# Patient Record
Sex: Female | Born: 1960
Health system: Southern US, Community
[De-identification: ages and names within clinical notes are randomized; demographics above are authoritative.]

## PROBLEM LIST (undated history)

## (undated) DIAGNOSIS — E559 Vitamin D deficiency, unspecified: Secondary | ICD-10-CM

## (undated) DIAGNOSIS — E0500 Thyrotoxicosis with diffuse goiter without thyrotoxic crisis or storm: Secondary | ICD-10-CM

## (undated) DIAGNOSIS — D259 Leiomyoma of uterus, unspecified: Secondary | ICD-10-CM

## (undated) DIAGNOSIS — E756 Lipid storage disorder, unspecified: Secondary | ICD-10-CM

## (undated) DIAGNOSIS — Z8052 Family history of malignant neoplasm of bladder: Secondary | ICD-10-CM

## (undated) DIAGNOSIS — E785 Hyperlipidemia, unspecified: Secondary | ICD-10-CM

## (undated) DIAGNOSIS — E0590 Thyrotoxicosis, unspecified without thyrotoxic crisis or storm: Secondary | ICD-10-CM

## (undated) HISTORY — DX: Vitamin D deficiency, unspecified: E55.9

## (undated) HISTORY — DX: Thyrotoxicosis with diffuse goiter without thyrotoxic crisis or storm: E05.00

## (undated) HISTORY — DX: Family history of malignant neoplasm of bladder: Z80.52

## (undated) HISTORY — DX: Lipid storage disorder, unspecified: E75.6

## (undated) HISTORY — DX: Hyperlipidemia, unspecified: E78.5

## (undated) HISTORY — DX: Thyrotoxicosis, unspecified without thyrotoxic crisis or storm: E05.90

## (undated) HISTORY — DX: Leiomyoma of uterus, unspecified: D25.9

---
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
## 2011-04-19 HISTORY — PX: NECK SURGERY: SHX720

## 2012-11-16 LAB — HM PAP SMEAR: HM Pap smear: NORMAL

## 2012-11-16 LAB — HM MAMMOGRAPHY: HM Mammogram: NORMAL

## 2012-11-29 DIAGNOSIS — E559 Vitamin D deficiency, unspecified: Secondary | ICD-10-CM | POA: Insufficient documentation

## 2013-02-19 LAB — HM COLONOSCOPY

## 2013-09-17 LAB — LIPID PANEL
CHOLESTEROL, TOTAL: 188
HDL: 69
Hemoglobin-A1c: 5.7
LDL: 88
Triglycerides: 153
VIT D 25 HYDROXY: 61.7

## 2013-12-17 LAB — LIPID PANEL
ALT: 29
AST: 31 U/L
Cholesterol, Total: 184
HDL: 73
LDL: 89
TRIGLYCERIDES: 109

## 2014-05-12 LAB — LIPID PANEL
CHOLESTEROL, TOTAL: 222
HDL: 72
Hemoglobin-A1c: 5.6
Triglycerides: 235
Vit D, 25-Hydroxy: 36.9

## 2014-05-17 DIAGNOSIS — E0500 Thyrotoxicosis with diffuse goiter without thyrotoxic crisis or storm: Secondary | ICD-10-CM | POA: Insufficient documentation

## 2014-08-07 ENCOUNTER — Encounter: Admit: 2014-08-07 | Disposition: A | Discharge status: Home / Self Care | Payer: Self-pay

## 2014-08-25 DIAGNOSIS — C4491 Basal cell carcinoma of skin, unspecified: Secondary | ICD-10-CM

## 2014-08-25 HISTORY — DX: Basal cell carcinoma of skin, unspecified: C44.91

## 2014-11-05 ENCOUNTER — Telehealth: Payer: Self-pay | Admitting: Family Medicine

## 2014-11-05 DIAGNOSIS — E785 Hyperlipidemia, unspecified: Secondary | ICD-10-CM

## 2014-11-05 NOTE — Telephone Encounter (Signed)
Pt wants to come by and pick up lab order.   She has appt aug 2 but she wants to get her labs done 11/10/14.     Thanks , Con Memos

## 2014-11-06 DIAGNOSIS — E785 Hyperlipidemia, unspecified: Secondary | ICD-10-CM | POA: Insufficient documentation

## 2014-11-06 NOTE — Telephone Encounter (Signed)
Order is ready to pick up.  

## 2014-11-12 LAB — NMR LIPOPROF NOLP+GRAPH
HDL Particle Number: 47.4 umol/L (ref >=30.5)
LDL Particle Number: 1970 nmol/L — ABNORMAL HIGH (ref <1000)
LDL Size: 19.6 nm (ref >20.5)
LP-IR Score: 60 — ABNORMAL HIGH (ref <=45)
NMR PDF Image: 0
Small LDL Particle Number: 1536 nmol/L — ABNORMAL HIGH (ref <=527)

## 2014-11-18 ENCOUNTER — Encounter: Payer: Self-pay | Admitting: *Deleted

## 2014-11-18 ENCOUNTER — Ambulatory Visit (INDEPENDENT_AMBULATORY_CARE_PROVIDER_SITE_OTHER): Payer: 59 | Admitting: Family Medicine

## 2014-11-18 ENCOUNTER — Encounter: Payer: Self-pay | Admitting: Family Medicine

## 2014-11-18 VITALS — BP 116/70 | HR 61 | Temp 98.3°F | Resp 16 | Wt 178.0 lb

## 2014-11-18 DIAGNOSIS — E559 Vitamin D deficiency, unspecified: Secondary | ICD-10-CM

## 2014-11-18 DIAGNOSIS — E756 Lipid storage disorder, unspecified: Secondary | ICD-10-CM | POA: Insufficient documentation

## 2014-11-18 DIAGNOSIS — E0590 Thyrotoxicosis, unspecified without thyrotoxic crisis or storm: Secondary | ICD-10-CM | POA: Insufficient documentation

## 2014-11-18 DIAGNOSIS — E119 Type 2 diabetes mellitus without complications: Secondary | ICD-10-CM | POA: Insufficient documentation

## 2014-11-18 DIAGNOSIS — R7309 Other abnormal glucose: Secondary | ICD-10-CM

## 2014-11-18 DIAGNOSIS — R7303 Prediabetes: Secondary | ICD-10-CM | POA: Insufficient documentation

## 2014-11-18 DIAGNOSIS — E785 Hyperlipidemia, unspecified: Secondary | ICD-10-CM | POA: Diagnosis not present

## 2014-11-18 DIAGNOSIS — E039 Hypothyroidism, unspecified: Secondary | ICD-10-CM | POA: Insufficient documentation

## 2014-11-18 DIAGNOSIS — R0989 Other specified symptoms and signs involving the circulatory and respiratory systems: Secondary | ICD-10-CM | POA: Insufficient documentation

## 2014-11-18 DIAGNOSIS — Z8052 Family history of malignant neoplasm of bladder: Secondary | ICD-10-CM | POA: Insufficient documentation

## 2014-11-18 LAB — POCT GLYCOSYLATED HEMOGLOBIN (HGB A1C)
ESTIMATED AVERAGE GLUCOSE: 114
HEMOGLOBIN A1C: 5.6

## 2014-11-18 MED ORDER — PRAVASTATIN SODIUM 20 MG PO TABS: 20.0000 mg | ORAL_TABLET | Freq: Every day | ORAL | Status: DC

## 2014-11-18 NOTE — Progress Notes (Signed)
       Patient: Connie Bradford Female    DOB: 29-Jun-1960   54 y.o.   MRN: [id number] Visit Date: 11/18/2014  Today's Provider: Lelon Huh, MD   Chief Complaint  Patient presents with  . Follow-up   Subjective:    HPI  Dyslipidemia She states she has not been following low saturated diet well and had stop taking the 40mg  pravastatin because it was causing leg cramps. She had been tolerating 20mg  pravastatin well before then.   Pre-diabetes She has not been doing well with diet, but is exercising most days and generally feeling well. Her A1c at last visit had improved to 5.6%    Allergies  Allergen Reactions  . Penicillins Rash   Previous Medications   B COMPLEX VITAMINS PO    Take 1 tablet by mouth daily.    METHIMAZOLE (TAPAZOLE) 10 MG TABLET    Take 0.5 tablets by mouth daily.   MULTIPLE VITAMIN PO    Take 1 tablet by mouth daily.   OMEGA 3-6-9 FATTY ACIDS (OMEGA 3-6-9 COMPLEX) CAPS    Take 1 capsule by mouth daily.   PRAVASTATIN (PRAVACHOL) 40 MG TABLET    Take 1 tablet by mouth at bedtime.   VITAMIN D, ERGOCALCIFEROL, (DRISDOL) 50000 UNITS CAPS CAPSULE    Take by mouth.    Review of Systems  Respiratory: Negative for chest tightness and shortness of breath.   Cardiovascular: Negative for chest pain and palpitations.  Neurological: Negative for dizziness, light-headedness and headaches.    History  Substance Use Topics  . Smoking status: Former Research scientist (life sciences)  . Smokeless tobacco: Not on file  . Alcohol Use: No   Objective:   BP 116/70 mmHg  Pulse 61  Temp(Src) 98.3 F (36.8 C) (Oral)  Resp 16  Wt 178 lb (80.74 kg)  SpO2 99%  LMP 12/18/2013  Physical Exam General appearance: alert, well developed, well nourished, cooperative and in no distress Head: Normocephalic, without obvious abnormality, atraumatic Lungs: Respirations even and unlabored Extremities: No gross deformities Skin: Skin color, texture, turgor normal. No rashes seen  Psych: Appropriate mood and  affect. Neurologic: Mental status: Alert, oriented to person, place, and time, thought content appropriate.      Assessment & Plan:     1. Pre-diabetes Stable. Continue regular exercise and work on improving diet.  - POCT glycosylated hemoglobin (Hb A1C)  2. Dyslipidemia Lipoprofile worsened since stopping pravatatin. Considering family history will start back on low dose of pravastatin.   3. Vitamin D deficiency        Lelon Huh, MD  [facility]

## 2014-11-19 ENCOUNTER — Other Ambulatory Visit: Payer: Self-pay | Admitting: Family Medicine

## 2015-02-17 ENCOUNTER — Encounter: Payer: Self-pay | Admitting: Family Medicine

## 2015-04-29 ENCOUNTER — Other Ambulatory Visit: Payer: Self-pay | Admitting: *Deleted

## 2015-04-29 ENCOUNTER — Other Ambulatory Visit: Payer: Self-pay | Admitting: Family Medicine

## 2015-04-29 MED ORDER — PRAVASTATIN SODIUM 20 MG PO TABS: ORAL_TABLET | ORAL | Status: DC

## 2015-04-29 MED ORDER — METHIMAZOLE 10 MG PO TABS: 5.0000 mg | ORAL_TABLET | Freq: Every day | ORAL | Status: DC

## 2015-04-29 NOTE — Telephone Encounter (Signed)
Patient is requesting that methimazole 5 mg and pravastatin 20 mg be sent to [facility]. Patient is also requesting valacyclovir 500 mg, this is not in her med list or in allscript.

## 2015-05-20 ENCOUNTER — Telehealth: Payer: Self-pay | Admitting: Family Medicine

## 2015-05-20 DIAGNOSIS — E785 Hyperlipidemia, unspecified: Secondary | ICD-10-CM

## 2015-05-20 DIAGNOSIS — R7303 Prediabetes: Secondary | ICD-10-CM

## 2015-05-20 DIAGNOSIS — E559 Vitamin D deficiency, unspecified: Secondary | ICD-10-CM

## 2015-05-20 NOTE — Telephone Encounter (Signed)
Pt has an appt next week and wants to pick up lab sheeet so she get get her labs done prior to the visit.  Her call back is Connie Bradford

## 2015-05-20 NOTE — Telephone Encounter (Signed)
Please advise 

## 2015-05-22 ENCOUNTER — Ambulatory Visit: Payer: 59 | Admitting: Family Medicine

## 2015-05-25 NOTE — Telephone Encounter (Signed)
Pt called back to see if lab sheet was ready to be picked up. Please advise. Thanks TNP

## 2015-05-27 ENCOUNTER — Ambulatory Visit: Payer: 59 | Admitting: Family Medicine

## 2015-06-01 NOTE — Telephone Encounter (Signed)
Patient notified and f/u appt rescheduled.

## 2015-06-04 LAB — NMR, LIPOPROFILE
Cholesterol: 228 mg/dL — ABNORMAL HIGH (ref 100–199)
HDL Cholesterol by NMR: 61 mg/dL (ref >39)
HDL Particle Number: 52 umol/L (ref >=30.5)
LDL PARTICLE NUMBER: 1356 nmol/L — AB (ref <1000)
LDL SIZE: 20 nm (ref >20.5)
LDL-C: 125 mg/dL — ABNORMAL HIGH (ref 0–99)
LP-IR SCORE: 56 — AB (ref <=45)
Small LDL Particle Number: 954 nmol/L — ABNORMAL HIGH (ref <=527)
Triglycerides by NMR: 212 mg/dL — ABNORMAL HIGH (ref 0–149)

## 2015-06-04 LAB — HEMOGLOBIN A1C
ESTIMATED AVERAGE GLUCOSE: 117 mg/dL
HEMOGLOBIN A1C: 5.7 % — AB (ref 4.8–5.6)

## 2015-06-04 LAB — VITAMIN D 25 HYDROXY (VIT D DEFICIENCY, FRACTURES): VIT D 25 HYDROXY: 39.6 ng/mL (ref 30.0–100.0)

## 2015-06-08 ENCOUNTER — Ambulatory Visit (INDEPENDENT_AMBULATORY_CARE_PROVIDER_SITE_OTHER): Payer: BC Managed Care – PPO | Admitting: Family Medicine

## 2015-06-08 ENCOUNTER — Encounter: Payer: Self-pay | Admitting: Family Medicine

## 2015-06-08 VITALS — BP 120/74 | HR 71 | Temp 98.1°F | Resp 16 | Wt 185.0 lb

## 2015-06-08 DIAGNOSIS — E785 Hyperlipidemia, unspecified: Secondary | ICD-10-CM | POA: Diagnosis not present

## 2015-06-08 DIAGNOSIS — R7303 Prediabetes: Secondary | ICD-10-CM

## 2015-06-08 LAB — POCT GLYCOSYLATED HEMOGLOBIN (HGB A1C)
ESTIMATED AVERAGE GLUCOSE: 120
Hemoglobin A1C: 5.8

## 2015-06-08 NOTE — Progress Notes (Signed)
Patient: Connie Bradford Female    DOB: 11-Feb-1961   55 y.o.   MRN: [id number] Visit Date: 06/08/2015  Today's Provider: Lelon Huh, MD   Chief Complaint  Patient presents with  . Follow-up  . Hyperlipidemia   Subjective:    HPI    Lipid/Cholesterol, Follow-up:   Last seen for this 6 months ago.  At that time she had stopped pravastatin due to muscle aches that started when dose was previously increase to 40mg . She is now tolerating 20mg  well and being cautious with her diet Last Lipid Panel:    Component Value Date/Time   CHOL 228* 06/03/2015 0806   CHOL 222 05/12/2014   TRIG 212* 06/03/2015 0806   TRIG 235 05/12/2014   HDL 61 06/03/2015 0806    She reports good compliance with treatment. She is not having side effects. none  Wt Readings from Last 3 Encounters:  06/08/15 185 lb (83.915 kg)  11/18/14 178 lb (80.74 kg)  05/20/14 176 lb (79.833 kg)    ----------------------------------------------------------------------  Follow up pre-diabetes She has a strong family history of diabetes and is attempting to keep under control with low glycemic index diet. She is exercising fairly regularly.      HEMOGLOBIN A1C  Date Value Ref Range Status  11/18/2014 5.6  Final   06/03/2015 5.7* 4.8 - 5.6 % Final     Allergies  Allergen Reactions  . Penicillins Rash   Previous Medications   B COMPLEX VITAMINS PO    Take 1 tablet by mouth daily.    CHOLECALCIFEROL (VITAMIN D3) 5000 UNITS CAPS    Take 5,000 Units by mouth daily.   METHIMAZOLE (TAPAZOLE) 10 MG TABLET    Take 0.5 tablets (5 mg total) by mouth daily.   MULTIPLE VITAMIN PO    Take 1 tablet by mouth daily.   OMEGA 3-6-9 FATTY ACIDS (OMEGA 3-6-9 COMPLEX) CAPS    Take 1 capsule by mouth daily.   PRAVASTATIN (PRAVACHOL) 20 MG TABLET    One tablet every  evening   VITAMIN D, ERGOCALCIFEROL, (DRISDOL) 50000 UNITS CAPS CAPSULE    Take by mouth. Reported on 06/08/2015    Review of Systems    Constitutional: Positive for diaphoresis. Negative for fever, chills, appetite change and fatigue.  Respiratory: Negative for chest tightness and shortness of breath.   Cardiovascular: Negative for chest pain and palpitations.  Gastrointestinal: Negative for nausea, vomiting and abdominal pain.  Neurological: Negative for dizziness and weakness.    Social History  Substance Use Topics  . Smoking status: Former Research scientist (life sciences)  . Smokeless tobacco: Not on file  . Alcohol Use: No   Objective:   BP 120/74 mmHg  Pulse 71  Temp(Src) 98.1 F (36.7 C) (Oral)  Resp 16  Wt 185 lb (83.915 kg)  SpO2 98%  Physical Exam  General appearance: alert, well developed, well nourished, cooperative and in no distress Head: Normocephalic, without obvious abnormality, atraumatic Lungs: Respirations even and unlabored Extremities: No gross deformities Skin: Skin color, texture, turgor normal. No rashes seen  Psych: Appropriate mood and affect. Neurologic: Mental status: Alert, oriented to person, place, and time, thought content appropriate.     Assessment & Plan:     1. Pre-diabetes Stable. Continue current diet  - POCT glycosylated hemoglobin (Hb A1C)  2. Dyslipidemia Doing well on 20mg  pravastatin, did not tolerate higher dose. Continue monitoring lipids Q6 months.         Lelon Huh, MD  [facility]

## 2015-11-03 ENCOUNTER — Other Ambulatory Visit: Payer: Self-pay | Admitting: *Deleted

## 2015-11-03 MED ORDER — PRAVASTATIN SODIUM 20 MG PO TABS: ORAL_TABLET | ORAL | Status: DC

## 2015-11-03 NOTE — Telephone Encounter (Signed)
Requesting 90 day supply be resent to [place] [facility].

## 2015-11-25 ENCOUNTER — Telehealth: Payer: Self-pay | Admitting: Family Medicine

## 2015-11-25 DIAGNOSIS — R7303 Prediabetes: Secondary | ICD-10-CM

## 2015-11-25 DIAGNOSIS — E785 Hyperlipidemia, unspecified: Secondary | ICD-10-CM

## 2015-11-25 NOTE — Telephone Encounter (Signed)
Pt is requesting a lab slip to recheck labs.  [id number]

## 2015-11-25 NOTE — Telephone Encounter (Signed)
Please advise lab slip? 

## 2015-11-27 ENCOUNTER — Telehealth: Payer: Self-pay | Admitting: *Deleted

## 2015-11-27 NOTE — Telephone Encounter (Signed)
error 

## 2015-12-07 ENCOUNTER — Ambulatory Visit: Payer: BC Managed Care – PPO | Admitting: Family Medicine

## 2015-12-16 ENCOUNTER — Ambulatory Visit: Payer: BC Managed Care – PPO | Admitting: Family Medicine

## 2016-01-06 ENCOUNTER — Other Ambulatory Visit: Payer: Self-pay | Admitting: Family Medicine

## 2016-01-06 DIAGNOSIS — Z1231 Encounter for screening mammogram for malignant neoplasm of breast: Secondary | ICD-10-CM

## 2016-02-10 ENCOUNTER — Other Ambulatory Visit: Payer: Self-pay | Admitting: Family Medicine

## 2016-02-10 ENCOUNTER — Ambulatory Visit
Admission: RE | Admit: 2016-02-10 | Discharge: 2016-02-10 | Disposition: A | Discharge status: Home / Self Care | Payer: Managed Care, Other (non HMO) | Source: Ambulatory Visit | Attending: Family Medicine | Admitting: Family Medicine

## 2016-02-10 DIAGNOSIS — Z1231 Encounter for screening mammogram for malignant neoplasm of breast: Secondary | ICD-10-CM

## 2016-02-10 LAB — HM PAP SMEAR: HM PAP: NEGATIVE

## 2016-02-10 LAB — HM MAMMOGRAPHY: HM Mammogram: NORMAL (ref 0–4)

## 2016-02-15 ENCOUNTER — Ambulatory Visit
Admission: RE | Admit: 2016-02-15 | Discharge: 2016-02-15 | Disposition: A | Discharge status: Home / Self Care | Payer: Self-pay | Source: Ambulatory Visit | Attending: *Deleted | Admitting: *Deleted

## 2016-02-15 ENCOUNTER — Other Ambulatory Visit: Payer: Self-pay | Admitting: *Deleted

## 2016-02-15 ENCOUNTER — Ambulatory Visit: Payer: BC Managed Care – PPO | Admitting: Family Medicine

## 2016-02-15 DIAGNOSIS — Z9289 Personal history of other medical treatment: Secondary | ICD-10-CM

## 2016-11-03 ENCOUNTER — Ambulatory Visit (INDEPENDENT_AMBULATORY_CARE_PROVIDER_SITE_OTHER): Payer: Managed Care, Other (non HMO) | Admitting: Physician Assistant

## 2016-11-03 VITALS — BP 122/74 | HR 72 | Temp 98.4°F | Resp 14 | Wt 183.0 lb

## 2016-11-03 DIAGNOSIS — K648 Other hemorrhoids: Secondary | ICD-10-CM | POA: Diagnosis not present

## 2016-11-03 DIAGNOSIS — K625 Hemorrhage of anus and rectum: Secondary | ICD-10-CM

## 2016-11-03 NOTE — Progress Notes (Signed)
Patient: Connie Bradford Female    DOB: June 04, 1960   56 y.o.   MRN: [id number] Visit Date: 11/03/2016  Today's Provider: Trinna Post, PA-C   Chief Complaint  Patient presents with  . Rectal Bleeding   Subjective:    HPI  Patient is here to discuss rectal bleeding. Patient states she has had this issue for 8 months except not today. She at first would notice blood on the toilet paper after bowel movement and has progressed now blood is in the toilet water. Bright red. She is not experiencing any pain, burning or itching with bowel movements. Had some stomach cramping a couple of times before with this. No nausea or vomiting. Patient states her bowels have been a softer consistency, she is not having hard bowels. Does strain at points. Had normal colonoscopy with internal hemorrhoids in 2014 with Dr. Vira Agar.    Allergies  Allergen Reactions  . Penicillins Rash     Current Outpatient Prescriptions:  .  B COMPLEX VITAMINS PO, Take 1 tablet by mouth daily. , Disp: , Rfl:  .  Cholecalciferol (VITAMIN D3) 5000 units CAPS, Take 5,000 Units by mouth daily., Disp: , Rfl:  .  methimazole (TAPAZOLE) 10 MG tablet, Take 0.5 tablets (5 mg total) by mouth daily., Disp: 90 tablet, Rfl: 3 .  MULTIPLE VITAMIN PO, Take 1 tablet by mouth daily., Disp: , Rfl:  .  Omega 3-6-9 Fatty Acids (OMEGA 3-6-9 COMPLEX) CAPS, Take 1 capsule by mouth daily., Disp: , Rfl:   Review of Systems  Constitutional: Negative.   Respiratory: Negative.   Cardiovascular: Negative.   Gastrointestinal: Positive for blood in stool. Negative for constipation, diarrhea, nausea, rectal pain and vomiting.    Social History  Substance Use Topics  . Smoking status: Former Research scientist (life sciences)  . Smokeless tobacco: Not on file  . Alcohol use No   Objective:   BP 122/74   Pulse 72   Temp 98.4 F (36.9 C)   Resp 14   Wt 183 lb (83 kg)   BMI 29.99 kg/m  Vitals:   11/03/16 1605  BP: 122/74  Pulse: 72  Resp: 14  Temp: 98.4  F (36.9 C)  Weight: 183 lb (83 kg)     Physical Exam  Constitutional: She is oriented to person, place, and time. She appears well-developed and well-nourished.  Genitourinary: Rectal exam shows guaiac positive stool. Rectal exam shows no external hemorrhoid, no fissure, no mass, no tenderness and anal tone normal. Pelvic exam was performed with patient in the knee-chest position.  Neurological: She is alert and oriented to person, place, and time.  Skin: Skin is warm and dry.  Psychiatric: She has a normal mood and affect. Her behavior is normal.        Assessment & Plan:     1. Bright red rectal bleeding  Slightly positive OC light in office. No anal fissure or external hemorrhoid. 2014 colonoscopy with internal hemorrhoids with Dr. Vira Agar. Likely her internal hemorrhoids. Patient remains concerned. Will refer to GI for further eval.   - Ambulatory referral to Gastroenterology - IFOBT POC (occult bld, rslt in office); Future  2. Internal hemorrhoid  - Ambulatory referral to Gastroenterology  Return if symptoms worsen or fail to improve.  The entirety of the information documented in the History of Present Illness, Review of Systems and Physical Exam were personally obtained by me. Portions of this information were initially documented by Ashley Royalty, CMA and reviewed by  me for thoroughness and accuracy.        Trinna Post, PA-C  [facility]

## 2016-11-03 NOTE — Patient Instructions (Signed)
Rectal Bleeding °Rectal bleeding is when blood comes out of the opening of the butt (anus). People with this kind of bleeding may notice bright red blood in their underwear or in the toilet after they poop (have a bowel movement). They may also have dark red or black poop (stool). Rectal bleeding is often a sign that something is wrong. It needs to be checked by a doctor. °Follow these instructions at home: °Watch for any changes in your condition. Take these actions to help with bleeding and discomfort: °· Eat a diet that is high in fiber. This will keep your poop soft so it is easier for you to poop without pushing too hard. Ask your doctor to tell you what foods and drinks are high in fiber. °· Drink enough fluid to keep your pee (urine) clear or pale yellow. This also helps keep your poop soft. °· Try taking a warm bath. This may help with pain. °· Keep all follow-up visits as told by your doctor. This is important. ° °Get help right away if: °· You have new bleeding. °· You have more bleeding than before. °· You have black or dark red poop. °· You throw up (vomit) blood or something that looks like coffee grounds. °· You have pain or tenderness in your belly (abdomen). °· You have a fever. °· You feel weak. °· You feel sick to your stomach (nauseous). °· You pass out (faint). °· You have very bad pain in your butt. °· You cannot poop. °This information is not intended to replace advice given to you by your health care provider. Make sure you discuss any questions you have with your health care provider. °Document Released: 12/15/2010 Document Revised: 09/10/2015 Document Reviewed: 05/31/2015 °Elsevier Interactive Patient Education © 2018 Elsevier Inc. ° °

## 2016-12-26 ENCOUNTER — Encounter: Payer: Self-pay | Admitting: Gastroenterology

## 2016-12-26 ENCOUNTER — Encounter (INDEPENDENT_AMBULATORY_CARE_PROVIDER_SITE_OTHER): Payer: Self-pay

## 2016-12-26 ENCOUNTER — Ambulatory Visit (INDEPENDENT_AMBULATORY_CARE_PROVIDER_SITE_OTHER): Payer: Managed Care, Other (non HMO) | Admitting: Gastroenterology

## 2016-12-26 ENCOUNTER — Telehealth: Payer: Self-pay | Admitting: Gastroenterology

## 2016-12-26 VITALS — BP 131/71 | HR 79 | Temp 98.5°F | Ht 66.0 in | Wt 185.8 lb

## 2016-12-26 DIAGNOSIS — K625 Hemorrhage of anus and rectum: Secondary | ICD-10-CM

## 2016-12-26 NOTE — Telephone Encounter (Signed)
Patient left a voice message that she needs to reschedule. Maybe the first week of October

## 2016-12-26 NOTE — Addendum Note (Signed)
Addended by: Peggye Ley on: 12/26/2016 02:18 PM   Modules accepted: Orders, SmartSet

## 2016-12-26 NOTE — Progress Notes (Signed)
Connie Bellows MD, MRCP(U.K) [address]  [place]  [place], [place] [address]  Main: [phone]  Fax: [phone]   Gastroenterology Consultation  Referring Provider:     Paulene Floor Primary Care Physician:  Birdie Sons, MD Primary Gastroenterologist:  Dr. Jonathon Bradford  Reason for Consultation:     Rectal bleeding          HPI:   Connie Bradford is a 56 y.o. y/o female referred for consultation & management  by Dr. Caryn Section, Kirstie Peri, MD.    She has been referred for rectal bleeding . Last colonoscopy in 2014 with Dr Tiffany Kocher showed internal hemorrhoids and hyperplastic polyps.   Rectal bleeding :  Onset and where was blood seen  :first noticed 1 year , since then it got better then worse and then better. At this point is still having rectal bleeding . Everytime she has a bowel movement, its blood on top of the stool.  Frequency of bowel movements :Atleast once a day  Consistency : not hard  Change in shape of stool:fluctuates Pain associated with bowel movements:no  Blood thinner usage:no  NSAID's: no  Prior colonoscopy :yes  Family history of colon cancer or polyps:no  Weight loss:no      Past Medical History:  Diagnosis Date  . Dyslipidemia   . Family history of bladder cancer   . Hyperthyroidism   . Inborn lipid storage disorder   . Vitamin D deficiency     Past Surgical History:  Procedure Laterality Date  . APPENDECTOMY  1982   Cyst removed from ovaries   . TONSILLECTOMY  1969    Prior to Admission medications   Medication Sig Start Date End Date Taking? Authorizing Provider  B COMPLEX VITAMINS PO Take 1 tablet by mouth daily.     [provider]  Cholecalciferol (VITAMIN D3) 5000 units CAPS Take 5,000 Units by mouth daily.    [provider]  methimazole (TAPAZOLE) 10 MG tablet Take 0.5 tablets (5 mg total) by mouth daily. 04/29/15   Birdie Sons, MD  MULTIPLE VITAMIN PO Take 1 tablet by mouth daily.    [provider]  Omega 3-6-9 Fatty Acids (OMEGA 3-6-9 COMPLEX) CAPS Take 1 capsule by mouth daily.    [provider]  valACYclovir (VALTREX) 500 MG tablet  11/25/16   [provider]    Family History  Problem Relation Age of Onset  . Bladder Cancer Mother   . Diabetes Father   . Heart disease Father   . Bladder Cancer Brother   . Breast cancer Neg Hx      Social History  Substance Use Topics  . Smoking status: Former Research scientist (life sciences)  . Smokeless tobacco: Not on file  . Alcohol use No    Allergies as of 12/26/2016 - Review Complete 11/03/2016  Allergen Reaction Noted  . Penicillins Rash 11/18/2014    Review of Systems:    All systems reviewed and negative except where noted in HPI.   Physical Exam:  There were no vitals taken for this visit. No LMP recorded. Patient is not currently having periods (Reason: Perimenopausal). Psych:  Alert and cooperative. Normal mood and affect. General:   Alert,  Well-developed, well-nourished, pleasant and cooperative in NAD Head:  Normocephalic and atraumatic. Eyes:  Sclera clear, no icterus.   Conjunctiva pink. Ears:  Normal auditory acuity. Nose:  No deformity, discharge, or lesions. Mouth:  No deformity or lesions,oropharynx pink & moist. Neck:  Supple; no masses or thyromegaly. Lungs:  Respirations even and unlabored.  Clear throughout to auscultation.   No wheezes, crackles, or rhonchi. No acute distress. Heart:  Regular rate and rhythm; no murmurs, clicks, rubs, or gallops. Abdomen:  Normal bowel sounds.  No bruits.  Soft, non-tender and non-distended without masses, hepatosplenomegaly or hernias noted.  No guarding or rebound tenderness.    Msk:  Symmetrical without gross deformities. Good, equal movement & strength bilaterally. Skin:  Intact without significant lesions or rashes. No jaundice. Lymph Nodes:  No significant cervical adenopathy. Psych:  Alert and cooperative. Normal mood and affect.  Imaging Studies: No  results found.  Assessment and Plan:   [name] is a 56 y.o. y/o female has been referred for rectal bleeding   Plan   1. Colonoscopy   I have discussed alternative options, risks & benefits,  which include, but are not limited to, bleeding, infection, perforation,respiratory complication & drug reaction.  The patient agrees with this plan & written consent will be obtained.     Follow up PRN  Dr Connie Bellows MD,MRCP(U.K)

## 2016-12-27 NOTE — Telephone Encounter (Signed)
Rescheduled patient for 10/5

## 2017-01-19 ENCOUNTER — Encounter: Payer: Self-pay | Admitting: *Deleted

## 2017-01-20 ENCOUNTER — Ambulatory Visit: Payer: Managed Care, Other (non HMO) | Admitting: Certified Registered"

## 2017-01-20 ENCOUNTER — Encounter: Payer: Self-pay | Admitting: *Deleted

## 2017-01-20 ENCOUNTER — Encounter
Admission: RE | Disposition: A | Discharge status: Home / Self Care | Payer: Self-pay | Source: Ambulatory Visit | Attending: Gastroenterology

## 2017-01-20 ENCOUNTER — Ambulatory Visit
Admission: RE | Admit: 2017-01-20 | Discharge: 2017-01-20 | Disposition: A | Discharge status: Home / Self Care | Payer: Managed Care, Other (non HMO) | Source: Ambulatory Visit | Attending: Gastroenterology | Admitting: Gastroenterology

## 2017-01-20 DIAGNOSIS — Z87891 Personal history of nicotine dependence: Secondary | ICD-10-CM | POA: Insufficient documentation

## 2017-01-20 DIAGNOSIS — E755 Other lipid storage disorders: Secondary | ICD-10-CM | POA: Insufficient documentation

## 2017-01-20 DIAGNOSIS — E0590 Thyrotoxicosis, unspecified without thyrotoxic crisis or storm: Secondary | ICD-10-CM | POA: Insufficient documentation

## 2017-01-20 DIAGNOSIS — Z79899 Other long term (current) drug therapy: Secondary | ICD-10-CM | POA: Insufficient documentation

## 2017-01-20 DIAGNOSIS — Z880 Allergy status to penicillin: Secondary | ICD-10-CM | POA: Diagnosis not present

## 2017-01-20 DIAGNOSIS — K640 First degree hemorrhoids: Secondary | ICD-10-CM | POA: Diagnosis not present

## 2017-01-20 DIAGNOSIS — K625 Hemorrhage of anus and rectum: Secondary | ICD-10-CM | POA: Insufficient documentation

## 2017-01-20 DIAGNOSIS — K635 Polyp of colon: Secondary | ICD-10-CM | POA: Diagnosis not present

## 2017-01-20 DIAGNOSIS — K51419 Inflammatory polyps of colon with unspecified complications: Secondary | ICD-10-CM | POA: Diagnosis not present

## 2017-01-20 DIAGNOSIS — E559 Vitamin D deficiency, unspecified: Secondary | ICD-10-CM | POA: Insufficient documentation

## 2017-01-20 DIAGNOSIS — E785 Hyperlipidemia, unspecified: Secondary | ICD-10-CM | POA: Insufficient documentation

## 2017-01-20 HISTORY — PX: COLONOSCOPY WITH PROPOFOL: SHX5780

## 2017-01-20 SURGERY — COLONOSCOPY WITH PROPOFOL
Anesthesia: General

## 2017-01-20 MED ORDER — PROPOFOL 500 MG/50ML IV EMUL
INTRAVENOUS | Status: DC | PRN
  Administered 2017-01-20: 120 ug/kg/min via INTRAVENOUS

## 2017-01-20 MED ORDER — LIDOCAINE 2% (20 MG/ML) 5 ML SYRINGE
INTRAMUSCULAR | Status: DC | PRN
  Administered 2017-01-20: 25 mg via INTRAVENOUS

## 2017-01-20 MED ORDER — PROPOFOL 10 MG/ML IV BOLUS
INTRAVENOUS | Status: DC | PRN
  Administered 2017-01-20: 70 mg via INTRAVENOUS
  Administered 2017-01-20: 30 mg via INTRAVENOUS

## 2017-01-20 MED ORDER — PROPOFOL 500 MG/50ML IV EMUL
INTRAVENOUS | Status: AC
  Filled 2017-01-20: qty 100

## 2017-01-20 MED ORDER — SODIUM CHLORIDE 0.9 % IV SOLN
INTRAVENOUS | Status: DC
Start: 2017-01-20 — End: 2017-01-20
  Administered 2017-01-20: 09:00:00 via INTRAVENOUS

## 2017-01-20 NOTE — Anesthesia Postprocedure Evaluation (Signed)
Anesthesia Post Note  Patient: Connie Bradford  Procedure(s) Performed: COLONOSCOPY WITH PROPOFOL (N/A )  Patient location during evaluation: Endoscopy Anesthesia Type: General Level of consciousness: awake and alert Pain management: pain level controlled Vital Signs Assessment: post-procedure vital signs reviewed and stable Respiratory status: spontaneous breathing and respiratory function stable Cardiovascular status: stable Anesthetic complications: no     Last Vitals:  Vitals:   01/20/17 1037 01/20/17 1038  BP: 115/65 (!) 116/55  Pulse: 70 77  Resp: (!) 21 16  Temp: (!) 35.8 C (!) 36.1 C  SpO2: 100% 100%    Last Pain:  Vitals:   01/20/17 1037  TempSrc: Tympanic                 KEPHART,WILLIAM K

## 2017-01-20 NOTE — Anesthesia Preprocedure Evaluation (Signed)
Anesthesia Evaluation  Patient identified by MRN, date of birth, ID band Patient awake    Reviewed: Allergy & Precautions, NPO status , Patient's Chart, lab work & pertinent test results  History of Anesthesia Complications Negative for: history of anesthetic complications  Airway Mallampati: II       Dental   Pulmonary neg sleep apnea, neg COPD, former smoker,           Cardiovascular (-) hypertension(-) Past MI and (-) CHF (-) dysrhythmias (-) Valvular Problems/Murmurs     Neuro/Psych neg Seizures    GI/Hepatic Neg liver ROS, neg GERD  ,  Endo/Other  neg diabetesHyperthyroidism   Renal/GU negative Renal ROS     Musculoskeletal   Abdominal   Peds  Hematology   Anesthesia Other Findings   Reproductive/Obstetrics                             Anesthesia Physical Anesthesia Plan  ASA: II  Anesthesia Plan: General   Post-op Pain Management:    Induction: Intravenous  PONV Risk Score and Plan: Propofol infusion  Airway Management Planned: Nasal Cannula  Additional Equipment:   Intra-op Plan:   Post-operative Plan:   Informed Consent: I have reviewed the patients History and Physical, chart, labs and discussed the procedure including the risks, benefits and alternatives for the proposed anesthesia with the patient or authorized representative who has indicated his/her understanding and acceptance.     Plan Discussed with:   Anesthesia Plan Comments:         Anesthesia Quick Evaluation

## 2017-01-20 NOTE — Anesthesia Post-op Follow-up Note (Signed)
Anesthesia QCDR form completed.        

## 2017-01-20 NOTE — H&P (Signed)
  Jonathon Bellows MD [address], [place], [place] [address] Phone: [phone] Fax : [phone]  Primary Care Physician:  Birdie Sons, MD Primary Gastroenterologist:  Dr. Jonathon Bellows   Pre-Procedure History & Physical: HPI:  Connie Bradford is a 56 y.o. female is here for an colonoscopy.   Past Medical History:  Diagnosis Date  . Dyslipidemia   . Family history of bladder cancer   . Hyperthyroidism   . Inborn lipid storage disorder   . Vitamin D deficiency     Past Surgical History:  Procedure Laterality Date  . APPENDECTOMY  1982   Cyst removed from ovaries   . TONSILLECTOMY  1969    Prior to Admission medications   Medication Sig Start Date End Date Taking? Authorizing Provider  B COMPLEX VITAMINS PO Take 1 tablet by mouth daily.    Yes [provider]  Cholecalciferol (VITAMIN D3) 5000 units CAPS Take 5,000 Units by mouth daily.   Yes [provider]  MULTIPLE VITAMIN PO Take 1 tablet by mouth daily.   Yes [provider]  Omega 3-6-9 Fatty Acids (OMEGA 3-6-9 COMPLEX) CAPS Take 1 capsule by mouth daily.   Yes [provider]  methimazole (TAPAZOLE) 10 MG tablet Take 0.5 tablets (5 mg total) by mouth daily. 04/29/15   Birdie Sons, MD  valACYclovir (VALTREX) 500 MG tablet  11/25/16   [provider]    Allergies as of 12/26/2016 - Review Complete 12/26/2016  Allergen Reaction Noted  . Penicillins Rash 11/18/2014    Family History  Problem Relation Age of Onset  . Bladder Cancer Mother   . Diabetes Father   . Heart disease Father   . Bladder Cancer Brother   . Breast cancer Neg Hx     Social History   Social History  . Marital status: Married    Spouse name: N/A  . Number of children: 0  . Years of education: Coll Grad   Occupational History  . Full-Time     [place] [place] x9   Social History Main Topics  . Smoking status: Former Research scientist (life sciences)  . Smokeless tobacco: Never Used  . Alcohol  use No  . Drug use: No  . Sexual activity: Yes   Other Topics Concern  . Not on file   Social History Narrative  . No narrative on file    Review of Systems: See HPI, otherwise negative ROS  Physical Exam: BP 136/70   Pulse 75   Temp (!) 97.2 F (36.2 C) (Tympanic)   Resp 12   Ht 5\' 6"  (1.676 m)   Wt 178 lb (80.7 kg)   LMP 12/18/2013   SpO2 100%   BMI 28.73 kg/m  General:   Alert,  pleasant and cooperative in NAD Head:  Normocephalic and atraumatic. Neck:  Supple; no masses or thyromegaly. Lungs:  Clear throughout to auscultation.    Heart:  Regular rate and rhythm. Abdomen:  Soft, nontender and nondistended. Normal bowel sounds, without guarding, and without rebound.   Neurologic:  Alert and  oriented x4;  grossly normal neurologically.  Impression/Plan: Connie Bradford is here for an colonoscopy to be performed for rectal bleeding   Risks, benefits, limitations, and alternatives regarding  colonoscopy have been reviewed with the patient.  Questions have been answered.  All parties agreeable.   Jonathon Bellows, MD  01/20/2017, 9:34 AM

## 2017-01-20 NOTE — Op Note (Signed)
[facility] Gastroenterology Patient Name: Connie Bradford Procedure Date: 01/20/2017 10:10 AM MRN: [id number] Account #: [id number] Date of Birth: [birth year] Admit Type: Outpatient Age: 56 Room: [facility] ENDO ROOM 3 Gender: Female Note Status: Finalized Procedure:            Colonoscopy Indications:          Rectal bleeding Providers:            Jonathon Bellows MD, MD Referring MD:         Kirstie Peri. Caryn Section, MD (Referring MD) Medicines:            Monitored Anesthesia Care Complications:        No immediate complications. Procedure:            Pre-Anesthesia Assessment:                       - Prior to the procedure, a History and Physical was                        performed, and patient medications, allergies and                        sensitivities were reviewed. The patient's tolerance of                        previous anesthesia was reviewed.                       - The risks and benefits of the procedure and the                        sedation options and risks were discussed with the                        patient. All questions were answered and informed                        consent was obtained.                       - ASA Grade Assessment: II - A patient with mild                        systemic disease.                       After obtaining informed consent, the colonoscope was                        passed under direct vision. Throughout the procedure,                        the patient's blood pressure, pulse, and oxygen                        saturations were monitored continuously. The                        Colonoscope was introduced through the anus and                        advanced  to the the cecum, identified by the                        appendiceal orifice, IC valve and transillumination.                        The colonoscopy was performed with ease. The patient                        tolerated the procedure well. The quality of the bowel                preparation was good. Findings:      The perianal and digital rectal examinations were normal.      Non-bleeding internal hemorrhoids were found during retroflexion. The       hemorrhoids were large and Grade I (internal hemorrhoids that do not       prolapse).      A 3 mm polyp was found in the sigmoid colon. The polyp was sessile. The       polyp was removed with a cold biopsy forceps. Resection and retrieval       were complete.      The exam was otherwise without abnormality on direct and retroflexion       views. Impression:           - Non-bleeding internal hemorrhoids.                       - One 3 mm polyp in the sigmoid colon, removed with a                        cold biopsy forceps. Resected and retrieved.                       - The examination was otherwise normal on direct and                        retroflexion views. Recommendation:       - Discharge patient to home (with escort).                       - Resume previous diet.                       - Continue present medications.                       - Await pathology results.                       - Repeat colonoscopy for surveillance based on                        pathology results.                       - - Return to GI clinic with Dr Marius Ditch in 2 weeks to                        discuss treatment for bleeding hemorroids [Return Day]. Procedure Code(s):    --- Professional ---  45380, Colonoscopy, flexible; with biopsy, single or                        multiple Diagnosis Code(s):    --- Professional ---                       D12.5, Benign neoplasm of sigmoid colon                       K64.0, First degree hemorrhoids                       K62.5, Hemorrhage of anus and rectum CPT copyright 2016 American Medical Association. All rights reserved. The codes documented in this report are preliminary and upon coder review may  be revised to meet current compliance requirements. Jonathon Bellows, MD Jonathon Bellows MD, MD 01/20/2017 10:35:26 AM This report has been signed electronically. Number of Addenda: 0 Note Initiated On: 01/20/2017 10:10 AM Scope Withdrawal Time: 0 hours 11 minutes 12 seconds  Total Procedure Duration: 0 hours 17 minutes 34 seconds       [facility]

## 2017-01-20 NOTE — Transfer of Care (Signed)
Immediate Anesthesia Transfer of Care Note  Patient: Connie Bradford  Procedure(s) Performed: COLONOSCOPY WITH PROPOFOL (N/A )  Patient Location: Endoscopy Unit  Anesthesia Type:General  Level of Consciousness: awake and alert   Airway & Oxygen Therapy: Patient Spontanous Breathing  Post-op Assessment: Report given to RN and Post -op Vital signs reviewed and stable  Post vital signs: Reviewed  Last Vitals:  Vitals:   01/20/17 1037 01/20/17 1038  BP: 115/65 (!) 116/55  Pulse: 70 77  Resp: (!) 21 16  Temp: (!) 35.8 C (!) 36.1 C  SpO2: 100% 100%    Last Pain:  Vitals:   01/20/17 1037  TempSrc: Tympanic         Complications: No apparent anesthesia complications

## 2017-01-23 ENCOUNTER — Encounter: Payer: Self-pay | Admitting: Gastroenterology

## 2017-01-23 LAB — SURGICAL PATHOLOGY

## 2017-01-27 ENCOUNTER — Telehealth: Payer: Self-pay | Admitting: Gastroenterology

## 2017-02-01 ENCOUNTER — Telehealth: Payer: Self-pay | Admitting: Gastroenterology

## 2017-02-01 NOTE — Telephone Encounter (Signed)
Left voice message for patient to call and schedule for the hemorrhoid clinic in January with Dr. Marius Ditch

## 2017-02-01 NOTE — Telephone Encounter (Signed)
ERROR

## 2017-02-06 ENCOUNTER — Telehealth: Payer: Self-pay

## 2017-02-06 NOTE — Telephone Encounter (Signed)
-----   Message from Jonathon Bellows, MD sent at 02/02/2017 11:01 AM EDT ----- Polyp was benign- inform repeat colonoscopy in 5 years if has self or family history of colon cancer or polyps- otherwise 10 years

## 2017-02-06 NOTE — Telephone Encounter (Signed)
Advised patient of results per Dr. Vicente Males.   Polyp was benign- inform repeat colonoscopy in 5 years if has self or family history of colon cancer or polyps- otherwise 10 years

## 2017-02-09 ENCOUNTER — Telehealth: Payer: Self-pay | Admitting: Gastroenterology

## 2017-02-09 NOTE — Telephone Encounter (Signed)
lvm and told patient that I had to reschedule banding appt due to Dr. Marius Ditch being out.

## 2017-02-27 ENCOUNTER — Ambulatory Visit: Payer: Managed Care, Other (non HMO) | Admitting: Gastroenterology

## 2017-04-20 ENCOUNTER — Ambulatory Visit (INDEPENDENT_AMBULATORY_CARE_PROVIDER_SITE_OTHER): Payer: Managed Care, Other (non HMO) | Admitting: Gastroenterology

## 2017-04-20 ENCOUNTER — Encounter (INDEPENDENT_AMBULATORY_CARE_PROVIDER_SITE_OTHER): Payer: Self-pay

## 2017-04-20 ENCOUNTER — Encounter: Payer: Self-pay | Admitting: Gastroenterology

## 2017-04-20 VITALS — BP 145/72 | HR 76 | Temp 98.0°F | Ht 67.0 in | Wt 187.0 lb

## 2017-04-20 DIAGNOSIS — K625 Hemorrhage of anus and rectum: Secondary | ICD-10-CM | POA: Diagnosis not present

## 2017-04-20 DIAGNOSIS — K641 Second degree hemorrhoids: Secondary | ICD-10-CM | POA: Diagnosis not present

## 2017-04-20 DIAGNOSIS — K649 Unspecified hemorrhoids: Secondary | ICD-10-CM

## 2017-04-20 NOTE — Progress Notes (Signed)
PROCEDURE NOTE: The patient presents with symptomatic grade 2 hemorrhoids, unresponsive to maximal medical therapy, requesting rubber band ligation of his/her hemorrhoidal disease.  All risks, benefits and alternative forms of therapy were described and informed consent was obtained.  In the Left Lateral Decubitus position anoscopic examination revealed grade 2 hemorrhoids in the all position(s).   The decision was made to band the RP internal hemorrhoid, and the [place] was used to perform band ligation without complication.  Digital anorectal examination was then performed to assure proper positioning of the band, and to adjust the banded tissue as required.  The patient was discharged home without pain or other issues.  Dietary and behavioral recommendations were given and (if necessary - prescriptions were given), along with follow-up instructions.  The patient will return 2 weeks for follow-up and possible additional banding as required.  No complications were encountered and the patient tolerated the procedure well.  Cephas Darby, MD [address]  [place]  [place], [place] [address]  Main: [phone]  Fax: [phone] Pager: [phone]

## 2017-04-24 ENCOUNTER — Ambulatory Visit: Payer: Managed Care, Other (non HMO) | Admitting: Gastroenterology

## 2017-05-01 ENCOUNTER — Ambulatory Visit: Payer: Managed Care, Other (non HMO) | Admitting: Gastroenterology

## 2017-05-05 ENCOUNTER — Encounter: Payer: Self-pay | Admitting: Gastroenterology

## 2017-05-05 ENCOUNTER — Ambulatory Visit (INDEPENDENT_AMBULATORY_CARE_PROVIDER_SITE_OTHER): Payer: Managed Care, Other (non HMO) | Admitting: Gastroenterology

## 2017-05-05 ENCOUNTER — Other Ambulatory Visit: Payer: Self-pay

## 2017-05-05 VITALS — BP 130/74 | HR 68 | Temp 98.1°F | Ht 67.0 in | Wt 187.8 lb

## 2017-05-05 DIAGNOSIS — K641 Second degree hemorrhoids: Secondary | ICD-10-CM | POA: Diagnosis not present

## 2017-05-05 MED ORDER — NONFORMULARY OR COMPOUNDED ITEM: 0 refills | Status: DC

## 2017-05-05 NOTE — Progress Notes (Signed)
PROCEDURE NOTE: The patient presents with symptomatic grade 2 hemorrhoids, unresponsive to maximal medical therapy, requesting rubber band ligation of his/her hemorrhoidal disease.  All risks, benefits and alternative forms of therapy were described and informed consent was obtained.  The decision was made to band the LL internal hemorrhoid, and the [facility] [place] System was used to perform band ligation without complication.  Digital anorectal examination was then performed to assure proper positioning of the band, and to adjust the banded tissue as required.  The patient was discharged home without pain or other issues.  Dietary and behavioral recommendations were given and (if necessary - prescriptions were given), along with follow-up instructions.  The patient will return 2 weeks for follow-up and possible additional banding as required.  No complications were encountered and the patient tolerated the procedure well.  Rohini R Vanga, MD [address]  [address]  [place], [place] [address]  Main: [phone]  Fax: [phone] Pager: [phone]   

## 2017-05-19 ENCOUNTER — Ambulatory Visit (INDEPENDENT_AMBULATORY_CARE_PROVIDER_SITE_OTHER): Payer: Managed Care, Other (non HMO) | Admitting: Gastroenterology

## 2017-05-19 ENCOUNTER — Encounter: Payer: Self-pay | Admitting: Gastroenterology

## 2017-05-19 VITALS — BP 138/80 | HR 72 | Temp 98.1°F | Ht 67.0 in | Wt 188.0 lb

## 2017-05-19 DIAGNOSIS — K625 Hemorrhage of anus and rectum: Secondary | ICD-10-CM | POA: Diagnosis not present

## 2017-05-19 DIAGNOSIS — K641 Second degree hemorrhoids: Secondary | ICD-10-CM

## 2017-05-19 NOTE — Progress Notes (Signed)
PROCEDURE NOTE: The patient presents with symptomatic grade 2 hemorrhoids, unresponsive to maximal medical therapy, requesting rubber band ligation of his/her hemorrhoidal disease.  All risks, benefits and alternative forms of therapy were described and informed consent was obtained.  The decision was made to band the RA internal hemorrhoid, and the [place] was used to perform band ligation without complication.  Digital anorectal examination was then performed to assure proper positioning of the band, and to adjust the banded tissue as required.  The patient was discharged home without pain or other issues.  Dietary and behavioral recommendations were given and (if necessary - prescriptions were given), along with follow-up instructions.  The patient will return as needed for follow-up and possible additional banding as required.  I had performed ligation of all 3 hemorrhoids. She will return to hemorrhoid clinic if needed   No complications were encountered and the patient tolerated the procedure well.  Cephas Darby, MD [address]  [place]  [place], [place] [address]  Main: [phone]  Fax: [phone] Pager: [phone]

## 2017-05-26 ENCOUNTER — Ambulatory Visit (INDEPENDENT_AMBULATORY_CARE_PROVIDER_SITE_OTHER): Payer: Managed Care, Other (non HMO) | Admitting: Obstetrics & Gynecology

## 2017-05-26 ENCOUNTER — Encounter: Payer: Self-pay | Admitting: Obstetrics & Gynecology

## 2017-05-26 VITALS — BP 140/80 | HR 62 | Ht 67.0 in | Wt 188.0 lb

## 2017-05-26 DIAGNOSIS — Z131 Encounter for screening for diabetes mellitus: Secondary | ICD-10-CM | POA: Diagnosis not present

## 2017-05-26 DIAGNOSIS — K51419 Inflammatory polyps of colon with unspecified complications: Secondary | ICD-10-CM | POA: Diagnosis not present

## 2017-05-26 DIAGNOSIS — Z1239 Encounter for other screening for malignant neoplasm of breast: Secondary | ICD-10-CM

## 2017-05-26 DIAGNOSIS — D259 Leiomyoma of uterus, unspecified: Secondary | ICD-10-CM | POA: Diagnosis not present

## 2017-05-26 DIAGNOSIS — Z1231 Encounter for screening mammogram for malignant neoplasm of breast: Secondary | ICD-10-CM | POA: Diagnosis not present

## 2017-05-26 DIAGNOSIS — Z13220 Encounter for screening for lipoid disorders: Secondary | ICD-10-CM

## 2017-05-26 DIAGNOSIS — Z0000 Encounter for general adult medical examination without abnormal findings: Secondary | ICD-10-CM | POA: Diagnosis not present

## 2017-05-26 DIAGNOSIS — Z1321 Encounter for screening for nutritional disorder: Secondary | ICD-10-CM | POA: Diagnosis not present

## 2017-05-26 NOTE — Patient Instructions (Addendum)
PAP every three years Mammogram every year    Call [phone] to schedule at [facility] Colonoscopy every 10 years Labs today

## 2017-05-26 NOTE — Progress Notes (Signed)
HPI:      [name] is a 57 y.o. G0P0000 who LMP was in the past, she presents today for her annual examination. The patient is not sexually active. Herlast pap: approximate date 2017 and was normal and last mammogram: approximate date 2017 and was normal.  The patient does perform self breast exams.  There is no notable family history of breast or ovarian cancer in her family. The patient is not taking hormone replacement therapy. Patient denies post-menopausal vaginal bleeding.   The patient has regular exercise: yes. The patient denies current symptoms of depression.  No period in 2 years, mild vasomotor sx's.  GYN Hx: Last Colonoscopy:6 mos ago. Normal.  Last DEXA: never ago.    PMHx: Past Medical History:  Diagnosis Date  . Dyslipidemia   . Family history of bladder cancer   . Graves disease   . Hyperthyroidism   . Inborn lipid storage disorder   . Leiomyoma of uterus   . Vitamin D deficiency    Past Surgical History:  Procedure Laterality Date  . APPENDECTOMY  1982   Cyst removed from ovaries   . COLONOSCOPY WITH PROPOFOL N/A 01/20/2017   Procedure: COLONOSCOPY WITH PROPOFOL;  Surgeon: Jonathon Bellows, MD;  Location: [facility] ENDOSCOPY;  Service: Gastroenterology;  Laterality: N/A;  . [place]  . NECK SURGERY  2013   LIft from weight loss  . TONSILLECTOMY  1969   Family History  Problem Relation Age of Onset  . Bladder Cancer Mother   . Diabetes Father   . Heart disease Father   . Bladder Cancer Brother   . Breast cancer Neg Hx    Social History   Tobacco Use  . Smoking status: Former Research scientist (life sciences)  . Smokeless tobacco: Never Used  Substance Use Topics  . Alcohol use: No    Alcohol/week: 0.0 oz  . Drug use: No    Current Outpatient Medications:  .  B COMPLEX VITAMINS PO, Take 1 tablet by mouth daily. , Disp: , Rfl:  .  methimazole (TAPAZOLE) 5 MG tablet, , Disp: , Rfl:  .  Multiple Vitamin (MULTI-VITAMINS) TABS, Take by mouth., Disp: ,  Rfl:  .  Omega 3-6-9 Fatty Acids (OMEGA 3-6-9 COMPLEX) CAPS, Take 1 capsule by mouth daily., Disp: , Rfl:  .  valACYclovir (VALTREX) 500 MG tablet, , Disp: , Rfl:  Allergies: Penicillins  Review of Systems  Constitutional: Negative for chills, fever and malaise/fatigue.  HENT: Negative for congestion, sinus pain and sore throat.   Eyes: Negative for blurred vision and pain.  Respiratory: Negative for cough and wheezing.   Cardiovascular: Negative for chest pain and leg swelling.  Gastrointestinal: Negative for abdominal pain, constipation, diarrhea, heartburn, nausea and vomiting.  Genitourinary: Negative for dysuria, frequency, hematuria and urgency.  Musculoskeletal: Negative for back pain, joint pain, myalgias and neck pain.  Skin: Negative for itching and rash.  Neurological: Negative for dizziness, tremors and weakness.  Endo/Heme/Allergies: Does not bruise/bleed easily.  Psychiatric/Behavioral: Negative for depression. The patient is not nervous/anxious and does not have insomnia.    Objective: BP 140/80 (BP Location: Left Arm, Patient Position: Sitting, Cuff Size: Normal)   Pulse 62   Ht 5\' 7"  (1.702 m)   Wt 188 lb (85.3 kg)   LMP 12/18/2013   BMI 29.44 kg/m   Filed Weights   05/26/17 0845  Weight: 188 lb (85.3 kg)   Body mass index is 29.44 kg/m. Physical Exam  Constitutional: She is oriented  to person, place, and time. She appears well-developed and well-nourished. No distress.  Genitourinary: Rectum normal, vagina normal and uterus normal. Pelvic exam was performed with patient supine. There is no rash or lesion on the right labia. There is no rash or lesion on the left labia. Vagina exhibits no lesion. No bleeding in the vagina. Right adnexum does not display mass and does not display tenderness. Left adnexum does not display mass and does not display tenderness. Cervix does not exhibit motion tenderness, lesion, friability or polyp.   Uterus is mobile and midaxial.  Uterus is not enlarged or exhibiting a mass.  HENT:  Head: Normocephalic and atraumatic. Head is without laceration.  Right Ear: Hearing normal.  Left Ear: Hearing normal.  Nose: No epistaxis.  No foreign bodies.  Mouth/Throat: Uvula is midline, oropharynx is clear and moist and mucous membranes are normal.  Eyes: Pupils are equal, round, and reactive to light.  Neck: Normal range of motion. Neck supple. No thyromegaly present.  Cardiovascular: Normal rate and regular rhythm. Exam reveals no gallop and no friction rub.  No murmur heard. Pulmonary/Chest: Effort normal and breath sounds normal. No respiratory distress. She has no wheezes. Right breast exhibits no mass, no skin change and no tenderness. Left breast exhibits no mass, no skin change and no tenderness.  Abdominal: Soft. Bowel sounds are normal. She exhibits no distension. There is no tenderness. There is no rebound.  Musculoskeletal: Normal range of motion.  Neurological: She is alert and oriented to person, place, and time. No cranial nerve deficit.  Skin: Skin is warm and dry.  Psychiatric: She has a normal mood and affect. Judgment normal.  Vitals reviewed.  Assessment: Annual Exam 1. Annual physical exam   2. Screening for breast cancer   3. Uterine leiomyoma, unspecified location   4. Pseudopolyposis of colon with complication, unspecified part of colon ([place]) Chronic  5. Screening for cholesterol level   6. Screening for diabetes mellitus   7. Encounter for vitamin deficiency screening    Plan            1.  Cervical Screening-  Pap smear schedule reviewed with patient  2. Breast screening- Exam annually and mammogram scheduled  3. Colonoscopy every 10 years, Hemoccult testing after age 60  4. Labs Ordered today  5. Counseling for hormonal therapy: none  6. Fibroids, no sx's, monitor; likely will diminish w menopause and time    F/U  Return in about 1 year (around 05/26/2018) for Annual.  Barnett Applebaum, MD,  [facility], [facility] 05/26/2017  9:10 AM

## 2017-05-27 LAB — HEMOGLOBIN A1C
ESTIMATED AVERAGE GLUCOSE: 123 mg/dL
Hgb A1c MFr Bld: 5.9 % — ABNORMAL HIGH (ref 4.8–5.6)

## 2017-05-27 LAB — LIPID PANEL
CHOLESTEROL TOTAL: 235 mg/dL — AB (ref 100–199)
Chol/HDL Ratio: 4.2 ratio (ref 0.0–4.4)
HDL: 56 mg/dL (ref >39)
LDL Calculated: 141 mg/dL — ABNORMAL HIGH (ref 0–99)
TRIGLYCERIDES: 188 mg/dL — AB (ref 0–149)
VLDL CHOLESTEROL CAL: 38 mg/dL (ref 5–40)

## 2017-05-27 LAB — VITAMIN D 25 HYDROXY (VIT D DEFICIENCY, FRACTURES): Vit D, 25-Hydroxy: 35.5 ng/mL (ref 30.0–100.0)

## 2017-06-07 ENCOUNTER — Ambulatory Visit: Payer: Managed Care, Other (non HMO)

## 2017-06-12 ENCOUNTER — Telehealth: Payer: Self-pay | Admitting: Gastroenterology

## 2017-06-12 NOTE — Telephone Encounter (Signed)
pt left vm she states she had recently had 3 bandings done and is bleeding should she schedule apt ?? please call pt

## 2017-06-12 NOTE — Telephone Encounter (Signed)
Attempted to return patients call to see how severe her hemorrhoids were bleeding and I got no answer.  I did leave a message on her personal cell.

## 2017-06-23 ENCOUNTER — Other Ambulatory Visit
Admission: RE | Admit: 2017-06-23 | Discharge: 2017-06-23 | Disposition: A | Discharge status: Home / Self Care | Payer: Managed Care, Other (non HMO) | Source: Ambulatory Visit | Attending: Gastroenterology | Admitting: Gastroenterology

## 2017-06-23 ENCOUNTER — Encounter: Payer: Self-pay | Admitting: Gastroenterology

## 2017-06-23 ENCOUNTER — Ambulatory Visit (INDEPENDENT_AMBULATORY_CARE_PROVIDER_SITE_OTHER): Payer: Managed Care, Other (non HMO) | Admitting: Gastroenterology

## 2017-06-23 VITALS — BP 132/82 | HR 76 | Wt 187.0 lb

## 2017-06-23 DIAGNOSIS — K625 Hemorrhage of anus and rectum: Secondary | ICD-10-CM | POA: Insufficient documentation

## 2017-06-23 DIAGNOSIS — K648 Other hemorrhoids: Secondary | ICD-10-CM | POA: Diagnosis not present

## 2017-06-23 LAB — CBC
HCT: 40.3 % (ref 35.0–47.0)
HEMOGLOBIN: 13.2 g/dL (ref 12.0–16.0)
MCH: 28.8 pg (ref 26.0–34.0)
MCHC: 32.6 g/dL (ref 32.0–36.0)
MCV: 88.1 fL (ref 80.0–100.0)
PLATELETS: 220 10*3/uL (ref 150–440)
RBC: 4.57 MIL/uL (ref 3.80–5.20)
RDW: 13.6 % (ref 11.5–14.5)
WBC: 6.2 10*3/uL (ref 3.6–11.0)

## 2017-06-23 MED ORDER — HYDROCORTISONE 2.5 % RE CREA: 1.0000 "application " | TOPICAL_CREAM | Freq: Two times a day (BID) | RECTAL | 1 refills | Status: DC

## 2017-06-23 NOTE — Progress Notes (Signed)
PROCEDURE NOTE: The patient presents with symptomatic grade 1 hemorrhoids, unresponsive to maximal medical therapy, requesting rubber band ligation of his/her hemorrhoidal disease.  All risks, benefits and alternative forms of therapy were described and informed consent was obtained.  In the Left Lateral Decubitus position (if anoscopy is performed) anoscopic examination revealed grade 1 hemorrhoids in the RA, RP and LL position(s).   The decision was made to band the RP internal hemorrhoid, and the [place] was used to perform band ligation without complication.  Digital anorectal examination was then performed to assure proper positioning of the band, and to adjust the banded tissue as required.  The patient was discharged home without pain or other issues.  Dietary and behavioral recommendations were given and (if necessary - prescriptions were given), along with follow-up instructions.  The patient will return as needed for follow-up and possible additional banding as required.  No complications were encountered and the patient tolerated the procedure well.

## 2017-06-23 NOTE — Progress Notes (Signed)
Cephas Darby, MD [address]  [place]  [place], [place] [address]  Main: [phone]  Fax: [phone] Pager: [phone]   Primary Care Physician: Birdie Sons, MD  Primary Gastroenterologist:  Dr. Cephas Darby  Chief Complaint  Patient presents with  . Rectal Bleeding    HPI: Connie Bradford is a 57 y.o. female with history of rectal bleeding. She underwent 2 colonoscopies within last 5 years and revealed internal hemorrhoids only. She is referred to me originally for management of internal hemorrhoids with rubber band ligation. She underwent ligation of RA, RP, LL hemorrhoids. Last ligation was performed on 05/27/2017. She reports having 2 episodes of painless rectal bleeding, first episode 2 weeks ago and she saw blood on wiping, it was self-limited. She had to strain for bowel movement yesterday, had another episode of rectal bleeding. She saw blood as well as a [place] clot on toilet paper as well as in the bowel. She called our office to make a follow-up appointment. Overall, patient reports that she had seen significant improvement since hydration of her hemorrhoids. Her bowel movements are generally fairly regular with Metamucil and fiber diet except yesterday when she had to strain. She has not tried Anusol cream. She is interested in undergoing one more banding session today.she denies any other symptoms associated with hemorrhoids.  Current Outpatient Medications  Medication Sig Dispense Refill  . B COMPLEX VITAMINS PO Take 1 tablet by mouth daily.     . methimazole (TAPAZOLE) 5 MG tablet     . Multiple Vitamin (MULTI-VITAMINS) TABS Take by mouth.    . Omega 3-6-9 Fatty Acids (OMEGA 3-6-9 COMPLEX) CAPS Take 1 capsule by mouth daily.    . valACYclovir (VALTREX) 500 MG tablet      No current facility-administered medications for this visit.     Allergies as of 06/23/2017 - Review Complete 06/23/2017  Allergen Reaction Noted  . Penicillins Rash 11/18/2014     NSAIDs: none  Antiplts/Anticoagulants/Anti thrombotics: none  GI procedures:  Colonoscopy by Dr. Vicente Males on 01/20/2017, epigastric polyp in sigmoid colon, internal hemorrhoids Colonoscopy by Dr. Vira Agar on 02/19/2013  ROS:  General: Negative for anorexia, weight loss, fever, chills, fatigue, weakness. ENT: Negative for hoarseness, difficulty swallowing , nasal congestion. CV: Negative for chest pain, angina, palpitations, dyspnea on exertion, peripheral edema.  Respiratory: Negative for dyspnea at rest, dyspnea on exertion, cough, sputum, wheezing.  GI: See history of present illness. GU:  Negative for dysuria, hematuria, urinary incontinence, urinary frequency, nocturnal urination.  Endo: Negative for unusual weight change.    Physical Examination:   BP 132/82   Pulse 76   Wt 187 lb (84.8 kg)   LMP 12/18/2013   BMI 29.29 kg/m   General: Well-nourished, well-developed in no acute distress.  Eyes: No icterus. Conjunctivae pink. Mouth: Oropharyngeal mucosa moist and pink , no lesions erythema or exudate. Lungs: Clear to auscultation bilaterally. Non-labored. Heart: Regular rate and rhythm, no murmurs rubs or gallops.  Abdomen: Bowel sounds are normal, nontender, nondistended, no hepatosplenomegaly or masses, no hernia , no rebound or guarding.  Rectal exam revealed inflamed external hemorrhoids Extremities: No lower extremity edema. No clubbing or deformities. Neuro: Alert and oriented x 3.  Grossly intact. Skin: Warm and dry, no jaundice.   Psych: Alert and cooperative, normal mood and affect.   Imaging Studies: No results found.  Assessment and Plan:   Connie Bradford is a 57 y.o. female bleeding internal hemorrhoids, here for follow-up. She  underwent ligation of internal hemorrhoids 3. Her symptoms have significantly improved but have not completely resolved. Therefore, she is interested in undergoing one more banding session today. I also discussed with her about  other nonsurgical treatment options including infrared coagulation, banding under direct visualization or surgery. Patient is not interested in undergoing hemorrhoidectomy at this time.  - perform rubber band ligation today - apply Anusol cream 2 times daily - check CBC  Follow up as needed   Dr Sherri Sear, MD

## 2017-06-25 ENCOUNTER — Encounter: Payer: Self-pay | Admitting: Obstetrics & Gynecology

## 2017-08-23 ENCOUNTER — Ambulatory Visit
Admission: RE | Admit: 2017-08-23 | Discharge: 2017-08-23 | Disposition: A | Discharge status: Home / Self Care | Payer: Managed Care, Other (non HMO) | Source: Ambulatory Visit | Attending: Obstetrics & Gynecology | Admitting: Obstetrics & Gynecology

## 2017-08-23 DIAGNOSIS — Z1239 Encounter for other screening for malignant neoplasm of breast: Secondary | ICD-10-CM

## 2017-08-23 DIAGNOSIS — Z1231 Encounter for screening mammogram for malignant neoplasm of breast: Secondary | ICD-10-CM | POA: Diagnosis present

## 2017-09-28 ENCOUNTER — Other Ambulatory Visit: Payer: Self-pay | Admitting: Gastroenterology

## 2017-12-29 ENCOUNTER — Ambulatory Visit (INDEPENDENT_AMBULATORY_CARE_PROVIDER_SITE_OTHER): Payer: Managed Care, Other (non HMO) | Admitting: Obstetrics and Gynecology

## 2017-12-29 ENCOUNTER — Encounter: Payer: Self-pay | Admitting: Obstetrics and Gynecology

## 2017-12-29 VITALS — BP 114/60 | HR 69 | Ht 67.0 in | Wt 178.0 lb

## 2017-12-29 DIAGNOSIS — N6325 Unspecified lump in the left breast, overlapping quadrants: Secondary | ICD-10-CM

## 2017-12-29 DIAGNOSIS — N6320 Unspecified lump in the left breast, unspecified quadrant: Secondary | ICD-10-CM

## 2017-12-29 DIAGNOSIS — N6313 Unspecified lump in the right breast, lower outer quadrant: Secondary | ICD-10-CM | POA: Diagnosis not present

## 2017-12-29 NOTE — Progress Notes (Signed)
Patient ID: Connie Bradford, female   DOB: 1960/06/24, 57 y.o.   MRN: [id number]  Reason for Consult: Breast Mass (Lump in left breast that is moveable )   Referred by Birdie Sons, MD  Subjective:     HPI:  Connie Bradford is a 57 y.o. female. She presents for evaluation of a breast lump. She noticed the lump yesterday and was concerned. She denies changes to the skin of the breast. She denies nipple drainage. She mass is not tender. The mass is mobile. She has not had any trauma to the breast. She had a mammogram in May that was BIRADS 1.    Past Medical History:  Diagnosis Date  . Dyslipidemia   . Family history of bladder cancer   . Graves disease   . Hyperthyroidism   . Inborn lipid storage disorder   . Leiomyoma of uterus   . Vitamin D deficiency    Family History  Problem Relation Age of Onset  . Bladder Cancer Mother   . Diabetes Father   . Heart disease Father   . Bladder Cancer Brother   . Breast cancer Neg Hx    Past Surgical History:  Procedure Laterality Date  . APPENDECTOMY  1982   Cyst removed from ovaries   . COLONOSCOPY WITH PROPOFOL N/A 01/20/2017   Procedure: COLONOSCOPY WITH PROPOFOL;  Surgeon: Jonathon Bellows, MD;  Location: [facility] ENDOSCOPY;  Service: Gastroenterology;  Laterality: N/A;  . [place]  . NECK SURGERY  2013   LIft from weight loss  . TONSILLECTOMY  1969    Short Social History:  Social History   Tobacco Use  . Smoking status: Former Research scientist (life sciences)  . Smokeless tobacco: Never Used  Substance Use Topics  . Alcohol use: No    Alcohol/week: 0.0 standard drinks    Allergies  Allergen Reactions  . Penicillins Rash    Current Outpatient Medications  Medication Sig Dispense Refill  . B COMPLEX VITAMINS PO Take 1 tablet by mouth daily.     . methimazole (TAPAZOLE) 5 MG tablet     . Multiple Vitamin (MULTI-VITAMINS) TABS Take by mouth.    . Omega 3-6-9 Fatty Acids (OMEGA 3-6-9 COMPLEX) CAPS Take 1 capsule by  mouth daily.    . valACYclovir (VALTREX) 500 MG tablet      No current facility-administered medications for this visit.     Review of Systems  Constitutional: Negative for chills, fatigue, fever and unexpected weight change.  HENT: Negative for trouble swallowing.  Eyes: Negative for loss of vision.  Respiratory: Negative for cough, shortness of breath and wheezing.  Cardiovascular: Negative for chest pain, leg swelling, palpitations and syncope.  GI: Negative for abdominal pain, blood in stool, diarrhea, nausea and vomiting.  GU: Negative for difficulty urinating, dysuria, frequency and hematuria.  Musculoskeletal: Negative for back pain, leg pain and joint pain.  Skin: Negative for rash.  Neurological: Negative for dizziness, headaches, light-headedness, numbness and seizures.  Psychiatric: Negative for behavioral problem, confusion, depressed mood and sleep disturbance.        Objective:  Objective   Vitals:   12/29/17 1443  BP: 114/60  Pulse: 69  Weight: 178 lb (80.7 kg)  Height: 5\' 7"  (1.702 m)   Body mass index is 27.88 kg/m.  Physical Exam  Constitutional: She is oriented to person, place, and time. She appears well-developed and well-nourished.  HENT:  Head: Normocephalic and atraumatic.  Eyes: Pupils are equal, round, and reactive  to light. EOM are normal.  Cardiovascular: Normal rate and regular rhythm.  Pulmonary/Chest: Effort normal. No respiratory distress.    Neurological: She is alert and oriented to person, place, and time.  Skin: Skin is warm and dry.  Psychiatric: She has a normal mood and affect. Her behavior is normal. Judgment and thought content normal.  Nursing note and vitals reviewed.       Assessment/Plan:     57 yo with a breast mass, 1. Will obtain imaging with diagnostic mammogram and ultrasound.   More than 15 minutes were spent face to face with the patient in the room with more than 50% of the time spent providing counseling and  discussing the plan of management. We discussed her breast mass and obtaining imaging for it as her plan of management.     Adrian Prows MD [facility], [facility] 12/29/17 3:12 PM

## 2017-12-31 ENCOUNTER — Encounter: Payer: Self-pay | Admitting: Obstetrics and Gynecology

## 2018-01-05 ENCOUNTER — Ambulatory Visit
Admission: RE | Admit: 2018-01-05 | Discharge: 2018-01-05 | Disposition: A | Discharge status: Home / Self Care | Payer: Managed Care, Other (non HMO) | Source: Ambulatory Visit | Attending: Obstetrics and Gynecology | Admitting: Obstetrics and Gynecology

## 2018-01-05 DIAGNOSIS — N6325 Unspecified lump in the left breast, overlapping quadrants: Secondary | ICD-10-CM

## 2018-01-05 DIAGNOSIS — N6320 Unspecified lump in the left breast, unspecified quadrant: Secondary | ICD-10-CM | POA: Diagnosis present

## 2018-01-09 NOTE — Progress Notes (Signed)
Birads 2, released to [employer]

## 2018-09-26 DIAGNOSIS — D225 Melanocytic nevi of trunk: Secondary | ICD-10-CM | POA: Diagnosis not present

## 2018-09-26 DIAGNOSIS — L820 Inflamed seborrheic keratosis: Secondary | ICD-10-CM | POA: Diagnosis not present

## 2018-09-26 DIAGNOSIS — D229 Melanocytic nevi, unspecified: Secondary | ICD-10-CM

## 2018-09-26 DIAGNOSIS — L578 Other skin changes due to chronic exposure to nonionizing radiation: Secondary | ICD-10-CM | POA: Diagnosis not present

## 2018-09-26 DIAGNOSIS — Z85828 Personal history of other malignant neoplasm of skin: Secondary | ICD-10-CM | POA: Diagnosis not present

## 2018-09-26 DIAGNOSIS — D1800 Hemangioma unspecified site: Secondary | ICD-10-CM | POA: Diagnosis not present

## 2018-09-26 DIAGNOSIS — D485 Neoplasm of uncertain behavior of skin: Secondary | ICD-10-CM | POA: Diagnosis not present

## 2018-09-26 DIAGNOSIS — L719 Rosacea, unspecified: Secondary | ICD-10-CM | POA: Diagnosis not present

## 2018-09-26 DIAGNOSIS — D2230 Melanocytic nevi of unspecified part of face: Secondary | ICD-10-CM | POA: Diagnosis not present

## 2018-09-26 DIAGNOSIS — Z1283 Encounter for screening for malignant neoplasm of skin: Secondary | ICD-10-CM | POA: Diagnosis not present

## 2018-09-26 HISTORY — DX: Melanocytic nevi, unspecified: D22.9

## 2018-10-02 DIAGNOSIS — E0500 Thyrotoxicosis with diffuse goiter without thyrotoxic crisis or storm: Secondary | ICD-10-CM | POA: Diagnosis not present

## 2018-10-09 DIAGNOSIS — E0500 Thyrotoxicosis with diffuse goiter without thyrotoxic crisis or storm: Secondary | ICD-10-CM | POA: Diagnosis not present

## 2018-11-20 ENCOUNTER — Other Ambulatory Visit: Payer: Self-pay | Admitting: Family Medicine

## 2018-12-05 DIAGNOSIS — M2011 Hallux valgus (acquired), right foot: Secondary | ICD-10-CM | POA: Diagnosis not present

## 2018-12-31 ENCOUNTER — Ambulatory Visit: Payer: Self-pay | Admitting: Family Medicine

## 2019-01-07 ENCOUNTER — Other Ambulatory Visit: Payer: Managed Care, Other (non HMO)

## 2019-01-10 ENCOUNTER — Ambulatory Visit: Admit: 2019-01-10 | Payer: Managed Care, Other (non HMO) | Admitting: Podiatry

## 2019-01-10 SURGERY — OSTECTOMY
Anesthesia: Choice | Laterality: Right

## 2019-01-15 ENCOUNTER — Ambulatory Visit (INDEPENDENT_AMBULATORY_CARE_PROVIDER_SITE_OTHER): Payer: 59

## 2019-01-15 DIAGNOSIS — Z23 Encounter for immunization: Secondary | ICD-10-CM

## 2019-04-01 DIAGNOSIS — E0500 Thyrotoxicosis with diffuse goiter without thyrotoxic crisis or storm: Secondary | ICD-10-CM | POA: Diagnosis not present

## 2019-04-08 DIAGNOSIS — E0500 Thyrotoxicosis with diffuse goiter without thyrotoxic crisis or storm: Secondary | ICD-10-CM | POA: Diagnosis not present

## 2019-04-19 DIAGNOSIS — Z923 Personal history of irradiation: Secondary | ICD-10-CM

## 2019-04-19 DIAGNOSIS — Z9221 Personal history of antineoplastic chemotherapy: Secondary | ICD-10-CM

## 2019-04-19 HISTORY — DX: Personal history of antineoplastic chemotherapy: Z92.21

## 2019-04-19 HISTORY — DX: Personal history of irradiation: Z92.3

## 2019-05-07 ENCOUNTER — Ambulatory Visit: Payer: Managed Care, Other (non HMO) | Admitting: Obstetrics & Gynecology

## 2019-05-23 ENCOUNTER — Ambulatory Visit (INDEPENDENT_AMBULATORY_CARE_PROVIDER_SITE_OTHER): Payer: 59 | Admitting: Obstetrics & Gynecology

## 2019-05-23 ENCOUNTER — Other Ambulatory Visit (HOSPITAL_COMMUNITY)
Admission: RE | Admit: 2019-05-23 | Discharge: 2019-05-23 | Disposition: A | Discharge status: Home / Self Care | Payer: 59 | Source: Ambulatory Visit | Attending: Obstetrics & Gynecology | Admitting: Obstetrics & Gynecology

## 2019-05-23 ENCOUNTER — Other Ambulatory Visit: Payer: Self-pay

## 2019-05-23 ENCOUNTER — Encounter: Payer: Self-pay | Admitting: Obstetrics & Gynecology

## 2019-05-23 VITALS — BP 140/80 | Ht 67.0 in | Wt 190.0 lb

## 2019-05-23 DIAGNOSIS — Z01419 Encounter for gynecological examination (general) (routine) without abnormal findings: Secondary | ICD-10-CM

## 2019-05-23 DIAGNOSIS — Z1211 Encounter for screening for malignant neoplasm of colon: Secondary | ICD-10-CM

## 2019-05-23 DIAGNOSIS — Z124 Encounter for screening for malignant neoplasm of cervix: Secondary | ICD-10-CM | POA: Insufficient documentation

## 2019-05-23 DIAGNOSIS — Z1231 Encounter for screening mammogram for malignant neoplasm of breast: Secondary | ICD-10-CM

## 2019-05-23 DIAGNOSIS — E7800 Pure hypercholesterolemia, unspecified: Secondary | ICD-10-CM

## 2019-05-23 DIAGNOSIS — Z87898 Personal history of other specified conditions: Secondary | ICD-10-CM | POA: Diagnosis not present

## 2019-05-23 DIAGNOSIS — Z1329 Encounter for screening for other suspected endocrine disorder: Secondary | ICD-10-CM | POA: Diagnosis not present

## 2019-05-23 DIAGNOSIS — Z1321 Encounter for screening for nutritional disorder: Secondary | ICD-10-CM | POA: Diagnosis not present

## 2019-05-23 NOTE — Patient Instructions (Signed)
PAP every three years Mammogram every year    Call [phone] to schedule at [facility] Colonoscopy every 10 years Labs

## 2019-05-23 NOTE — Progress Notes (Signed)
HPI:      [name] is a 59 y.o. G0P0000 who LMP was in the past, she presents today for her annual examination.  The patient has no complaints today. The patient is sexually active. Herlast pap: approximate date 2017 and was normal and last mammogram: approximate date 2019 and was normal.  The patient does perform self breast exams.  There is no notable family history of breast or ovarian cancer in her family. The patient is not taking hormone replacement therapy. Patient denies post-menopausal vaginal bleeding.   The patient has regular exercise: yes. The patient denies current symptoms of depression.  Takes Vit D, Omega 3.  GYN Hx: Last Colonoscopy:8 years ago. Normal.  Prior Chol 235.  Prior HgbA1C borderline Past h/o low vit D, has been on therapy and lest check was normal  PMHx: Past Medical History:  Diagnosis Date  . Dyslipidemia   . Family history of bladder cancer   . Graves disease   . Hyperthyroidism   . Inborn lipid storage disorder   . Leiomyoma of uterus   . Vitamin D deficiency    Past Surgical History:  Procedure Laterality Date  . APPENDECTOMY  1982   Cyst removed from ovaries   . COLONOSCOPY WITH PROPOFOL N/A 01/20/2017   Procedure: COLONOSCOPY WITH PROPOFOL;  Surgeon: Connie Bellows, MD;  Location: [facility] ENDOSCOPY;  Service: Gastroenterology;  Laterality: N/A;  . [place]  . NECK SURGERY  2013   LIft from weight loss  . TONSILLECTOMY  1969   Family History  Problem Relation Age of Onset  . Bladder Cancer Mother   . Diabetes Father   . Heart disease Father   . Bladder Cancer Brother   . Breast cancer Neg Hx    Social History   Tobacco Use  . Smoking status: Former Research scientist (life sciences)  . Smokeless tobacco: Never Used  Substance Use Topics  . Alcohol use: No    Alcohol/week: 0.0 standard drinks  . Drug use: No    Current Outpatient Medications:  .  B COMPLEX VITAMINS PO, Take 1 tablet by mouth daily. , Disp: , Rfl:  .   methimazole (TAPAZOLE) 5 MG tablet, , Disp: , Rfl:  .  Multiple Vitamin (MULTI-VITAMINS) TABS, Take by mouth., Disp: , Rfl:  .  Omega 3-6-9 Fatty Acids (OMEGA 3-6-9 COMPLEX) CAPS, Take 1 capsule by mouth daily., Disp: , Rfl:  .  valACYclovir (VALTREX) 500 MG tablet, , Disp: , Rfl:  Allergies: Penicillins  Review of Systems  Constitutional: Negative for chills, fever and malaise/fatigue.  HENT: Negative for congestion, sinus pain and sore throat.   Eyes: Negative for blurred vision and pain.  Respiratory: Negative for cough and wheezing.   Cardiovascular: Negative for chest pain and leg swelling.  Gastrointestinal: Negative for abdominal pain, constipation, diarrhea, heartburn, nausea and vomiting.  Genitourinary: Negative for dysuria, frequency, hematuria and urgency.  Musculoskeletal: Negative for back pain, joint pain, myalgias and neck pain.  Skin: Negative for itching and rash.  Neurological: Negative for dizziness, tremors and weakness.  Endo/Heme/Allergies: Does not bruise/bleed easily.  Psychiatric/Behavioral: Negative for depression. The patient is not nervous/anxious and does not have insomnia.     Objective: BP 140/80   Ht 5\' 7"  (1.702 m)   Wt 190 lb (86.2 kg)   LMP 12/18/2013   BMI 29.76 kg/m   Filed Weights   05/23/19 0808  Weight: 190 lb (86.2 kg)   Body mass index is 29.76 kg/m. Physical  Exam Constitutional:      General: She is not in acute distress.    Appearance: She is well-developed.  Genitourinary:     Pelvic exam was performed with patient supine.     Vagina, uterus and rectum normal.     No lesions in the vagina.     No vaginal bleeding.     No cervical motion tenderness, friability, lesion or polyp.     Uterus is mobile.     Uterus is not enlarged.     No uterine mass detected.    Uterus is midaxial.     No right or left adnexal mass present.     Right adnexa not tender.     Left adnexa not tender.  HENT:     Head: Normocephalic and  atraumatic. No laceration.     Right Ear: Hearing normal.     Left Ear: Hearing normal.     Mouth/Throat:     Pharynx: Uvula midline.  Eyes:     Pupils: Pupils are equal, round, and reactive to light.  Neck:     Thyroid: No thyromegaly.  Cardiovascular:     Rate and Rhythm: Normal rate and regular rhythm.     Heart sounds: No murmur. No friction rub. No gallop.   Pulmonary:     Effort: Pulmonary effort is normal. No respiratory distress.     Breath sounds: Normal breath sounds. No wheezing.  Chest:     Breasts:        Right: No mass, skin change or tenderness.        Left: No mass, skin change or tenderness.  Abdominal:     General: Bowel sounds are normal. There is no distension.     Palpations: Abdomen is soft.     Tenderness: There is no abdominal tenderness. There is no rebound.  Musculoskeletal:        General: Normal range of motion.     Cervical back: Normal range of motion and neck supple.  Neurological:     Mental Status: She is alert and oriented to person, place, and time.     Cranial Nerves: No cranial nerve deficit.  Skin:    General: Skin is warm and dry.  Psychiatric:        Judgment: Judgment normal.  Vitals reviewed.     Assessment: Annual Exam 1. Women's annual routine gynecological examination   2. Encounter for screening mammogram for malignant neoplasm of breast   3. Screen for colon cancer   4. Hypercholesteremia   5. Screening for cervical cancer     Plan:            1.  Cervical Screening-  Pap smear done today  2. Breast screening- Exam annually and mammogram scheduled  3. Colonoscopy every 10 years, Hemoccult testing after age 21  4. Labs Ordered today  Prior Cholesterol high (takes Omega 3) and prediabetes  5. Counseling for hormonal therapy: none              6. FRAX - FRAX score for assessing the 10 year probability for fracture calculated and discussed today.  Based on age and score today, DEXA is not scheduled.    F/U  Return  in about 1 year (around 05/22/2020) for Annual.  Barnett Applebaum, MD, [facility], [facility] 05/23/2019  8:17 AM

## 2019-05-24 LAB — TSH: TSH: 2.65 u[IU]/mL (ref 0.450–4.500)

## 2019-05-24 LAB — VITAMIN D 25 HYDROXY (VIT D DEFICIENCY, FRACTURES): Vit D, 25-Hydroxy: 34.3 ng/mL (ref 30.0–100.0)

## 2019-05-24 LAB — LIPID PANEL
Chol/HDL Ratio: 4.4 ratio (ref 0.0–4.4)
Cholesterol, Total: 246 mg/dL — ABNORMAL HIGH (ref 100–199)
HDL: 56 mg/dL (ref >39)
LDL Chol Calc (NIH): 144 mg/dL — ABNORMAL HIGH (ref 0–99)
Triglycerides: 257 mg/dL — ABNORMAL HIGH (ref 0–149)
VLDL Cholesterol Cal: 46 mg/dL — ABNORMAL HIGH (ref 5–40)

## 2019-05-24 LAB — HEMOGLOBIN A1C
Est. average glucose Bld gHb Est-mCnc: 131 mg/dL
Hgb A1c MFr Bld: 6.2 % — ABNORMAL HIGH (ref 4.8–5.6)

## 2019-05-27 LAB — CYTOLOGY - PAP
Comment: NEGATIVE
Diagnosis: NEGATIVE
High risk HPV: NEGATIVE

## 2019-06-18 ENCOUNTER — Ambulatory Visit
Admission: RE | Admit: 2019-06-18 | Discharge: 2019-06-18 | Disposition: A | Discharge status: Home / Self Care | Payer: 59 | Source: Ambulatory Visit | Attending: Obstetrics & Gynecology | Admitting: Obstetrics & Gynecology

## 2019-06-18 DIAGNOSIS — Z1231 Encounter for screening mammogram for malignant neoplasm of breast: Secondary | ICD-10-CM

## 2019-06-19 ENCOUNTER — Other Ambulatory Visit: Payer: Self-pay | Admitting: Obstetrics & Gynecology

## 2019-06-19 DIAGNOSIS — R921 Mammographic calcification found on diagnostic imaging of breast: Secondary | ICD-10-CM

## 2019-06-19 DIAGNOSIS — R928 Other abnormal and inconclusive findings on diagnostic imaging of breast: Secondary | ICD-10-CM

## 2019-06-19 NOTE — Progress Notes (Signed)
Sch RIGHT Dx MMG and [place] Orders placed D/w pt  The results of your recent mammogram reveals an area that needs further magnification views to determine if there is any concern or if it is just a false alarm. There is no suggestion of cancer, just a need for further images and evaluation by the radiologist. They should be contacting you, if not already, to schedule these additional mammogram pictures. If you have any questions, or if they have not yet contacted you, then please give [place] a call at [phone], so that we may help in this process. I know this seems worrisome, yet usually additional xrays clear up any suspicion for breast cancer.

## 2019-06-23 ENCOUNTER — Ambulatory Visit: Payer: 59

## 2019-06-25 ENCOUNTER — Other Ambulatory Visit: Payer: Self-pay | Admitting: Obstetrics & Gynecology

## 2019-06-25 ENCOUNTER — Ambulatory Visit
Admission: RE | Admit: 2019-06-25 | Discharge: 2019-06-25 | Disposition: A | Discharge status: Home / Self Care | Payer: 59 | Source: Ambulatory Visit | Attending: Obstetrics & Gynecology | Admitting: Obstetrics & Gynecology

## 2019-06-25 DIAGNOSIS — R928 Other abnormal and inconclusive findings on diagnostic imaging of breast: Secondary | ICD-10-CM | POA: Diagnosis not present

## 2019-06-25 DIAGNOSIS — R921 Mammographic calcification found on diagnostic imaging of breast: Secondary | ICD-10-CM | POA: Insufficient documentation

## 2019-06-25 DIAGNOSIS — N6011 Diffuse cystic mastopathy of right breast: Secondary | ICD-10-CM | POA: Diagnosis not present

## 2019-07-05 ENCOUNTER — Ambulatory Visit
Admission: RE | Admit: 2019-07-05 | Discharge: 2019-07-05 | Disposition: A | Discharge status: Home / Self Care | Payer: 59 | Source: Ambulatory Visit | Attending: Obstetrics & Gynecology | Admitting: Obstetrics & Gynecology

## 2019-07-05 DIAGNOSIS — R928 Other abnormal and inconclusive findings on diagnostic imaging of breast: Secondary | ICD-10-CM | POA: Insufficient documentation

## 2019-07-05 DIAGNOSIS — R921 Mammographic calcification found on diagnostic imaging of breast: Secondary | ICD-10-CM | POA: Diagnosis not present

## 2019-07-05 DIAGNOSIS — C50511 Malignant neoplasm of lower-outer quadrant of right female breast: Secondary | ICD-10-CM | POA: Diagnosis not present

## 2019-07-05 HISTORY — PX: BREAST BIOPSY: SHX20

## 2019-07-06 ENCOUNTER — Ambulatory Visit: Payer: 59

## 2019-07-08 ENCOUNTER — Other Ambulatory Visit: Payer: Self-pay | Admitting: Anatomic Pathology & Clinical Pathology

## 2019-07-09 ENCOUNTER — Other Ambulatory Visit: Payer: Self-pay

## 2019-07-09 DIAGNOSIS — C50919 Malignant neoplasm of unspecified site of unspecified female breast: Secondary | ICD-10-CM

## 2019-07-10 ENCOUNTER — Other Ambulatory Visit: Payer: Self-pay | Admitting: General Surgery

## 2019-07-10 DIAGNOSIS — C50411 Malignant neoplasm of upper-outer quadrant of right female breast: Secondary | ICD-10-CM

## 2019-07-12 ENCOUNTER — Encounter: Payer: Self-pay | Admitting: Internal Medicine

## 2019-07-12 ENCOUNTER — Other Ambulatory Visit: Payer: Self-pay

## 2019-07-15 ENCOUNTER — Inpatient Hospital Stay: Payer: 59

## 2019-07-15 ENCOUNTER — Inpatient Hospital Stay: Payer: 59 | Attending: Internal Medicine | Admitting: Internal Medicine

## 2019-07-15 ENCOUNTER — Other Ambulatory Visit: Payer: Self-pay

## 2019-07-15 ENCOUNTER — Encounter: Payer: Self-pay | Admitting: Internal Medicine

## 2019-07-15 DIAGNOSIS — Z170 Estrogen receptor positive status [ER+]: Secondary | ICD-10-CM

## 2019-07-15 DIAGNOSIS — Z8052 Family history of malignant neoplasm of bladder: Secondary | ICD-10-CM | POA: Diagnosis not present

## 2019-07-15 DIAGNOSIS — C50411 Malignant neoplasm of upper-outer quadrant of right female breast: Secondary | ICD-10-CM | POA: Insufficient documentation

## 2019-07-15 DIAGNOSIS — Z87891 Personal history of nicotine dependence: Secondary | ICD-10-CM | POA: Insufficient documentation

## 2019-07-15 DIAGNOSIS — E039 Hypothyroidism, unspecified: Secondary | ICD-10-CM | POA: Diagnosis not present

## 2019-07-15 LAB — CBC WITH DIFFERENTIAL/PLATELET
Abs Immature Granulocytes: 0.05 10*3/uL (ref 0.00–0.07)
Basophils Absolute: 0.1 10*3/uL (ref 0.0–0.1)
Basophils Relative: 2 %
Eosinophils Absolute: 0.2 10*3/uL (ref 0.0–0.5)
Eosinophils Relative: 3 %
HCT: 41.7 % (ref 36.0–46.0)
Hemoglobin: 13.4 g/dL (ref 12.0–15.0)
Immature Granulocytes: 1 %
Lymphocytes Relative: 30 %
Lymphs Abs: 2.2 10*3/uL (ref 0.7–4.0)
MCH: 28.4 pg (ref 26.0–34.0)
MCHC: 32.1 g/dL (ref 30.0–36.0)
MCV: 88.3 fL (ref 80.0–100.0)
Monocytes Absolute: 0.7 10*3/uL (ref 0.1–1.0)
Monocytes Relative: 9 %
Neutro Abs: 4 10*3/uL (ref 1.7–7.7)
Neutrophils Relative %: 55 %
Platelets: 270 10*3/uL (ref 150–400)
RBC: 4.72 MIL/uL (ref 3.87–5.11)
RDW: 13.4 % (ref 11.5–15.5)
WBC: 7.3 10*3/uL (ref 4.0–10.5)
nRBC: 0 % (ref 0.0–0.2)

## 2019-07-15 LAB — COMPREHENSIVE METABOLIC PANEL
ALT: 106 U/L — ABNORMAL HIGH (ref 0–44)
AST: 87 U/L — ABNORMAL HIGH (ref 15–41)
Albumin: 4.7 g/dL (ref 3.5–5.0)
Alkaline Phosphatase: 90 U/L (ref 38–126)
Anion gap: 9 (ref 5–15)
BUN: 18 mg/dL (ref 6–20)
CO2: 25 mmol/L (ref 22–32)
Calcium: 9.9 mg/dL (ref 8.9–10.3)
Chloride: 103 mmol/L (ref 98–111)
Creatinine, Ser: 0.85 mg/dL (ref 0.44–1.00)
GFR calc Af Amer: >60 mL/min (ref >60)
GFR calc non Af Amer: >60 mL/min (ref >60)
Glucose, Bld: 99 mg/dL (ref 70–99)
Potassium: 4.4 mmol/L (ref 3.5–5.1)
Sodium: 137 mmol/L (ref 135–145)
Total Bilirubin: 0.7 mg/dL (ref 0.3–1.2)
Total Protein: 8.1 g/dL (ref 6.5–8.1)

## 2019-07-15 LAB — PROTIME-INR
INR: 1 (ref 0.8–1.2)
Prothrombin Time: 12.6 seconds (ref 11.4–15.2)

## 2019-07-15 LAB — APTT: aPTT: 27 seconds (ref 24–36)

## 2019-07-15 NOTE — Progress Notes (Signed)
[facility] [place] NOTE  Patient Care Team: Birdie Sons, MD as PCP - General (Family Medicine) [facility] Gyn (Gynecology) Theodore Demark, RN as Oncology Nurse Navigator  CHIEF COMPLAINTS/PURPOSE OF CONSULTATION: Breast cancer  #  Oncology History Overview Note  # MARCH 2021- RUOQ- 80m- [BREAST, RIGHT, LATERAL POSTERIOR  - INVASIVE MAMMARY CARCINOMA, NO SPECIAL TYPE, ASSOCIATED WITH  CALCIFICATIONS.][place] ER-> 90%; PR- 50-90%; HER 2 FISH- Pending.  .Marland KitchenBREAST, RIGHT, LATERAL MIDDLE DEPTH (RIBBON-SHAPED CLIP):  STEREOTACTIC-GUIDED CORE BIOPSY: - DUCTAL CARCINOMA IN SITU (DCIS), HIGH-GRADE, ASSOCIATED WITH  CALCIFICATIONS.   #Hypothyroidism-methimazole [Dr.Solum]  # SURVIVORSHIP:   # GENETICS:   DIAGNOSIS:   STAGE:         ;  GOALS:  CURRENT/MOST RECENT THERAPY :     Carcinoma of upper-outer quadrant of right breast in female, estrogen receptor positive ([place]  07/15/2019 Initial Diagnosis   Carcinoma of upper-outer quadrant of right breast in female, estrogen receptor positive ([place]      HISTORY OF PRESENTING ILLNESS:  Connie Bradford 59y.o.  female female with no prior history of breast cancer/or malignancies has been referred to [place] further evaluation recommendations for new diagnosis of breast cancer.   Patient states she was found to have an abnormal screening mammogram which led to diagnostic mammogram/ultrasound/followed by biopsy-as summarized above.  Family history of breast cancer: none Family history of other cancers:  Mom-bladder Menopause: years old; LMP- at 89Used OCP: yes when younger  Used estrogen and progesterone therapy: none  History of Radiation to the chest: none Previous biopsy: none   Review of Systems  Constitutional: Negative for chills, diaphoresis, fever, malaise/fatigue and weight loss.  HENT: Negative for nosebleeds and sore throat.   Eyes: Negative for double vision.  Respiratory: Negative for cough, hemoptysis,  sputum production, shortness of breath and wheezing.   Cardiovascular: Negative for chest pain, palpitations, orthopnea and leg swelling.  Gastrointestinal: Negative for abdominal pain, blood in stool, constipation, diarrhea, heartburn, melena, nausea and vomiting.  Genitourinary: Negative for dysuria, frequency and urgency.  Musculoskeletal: Negative for back pain and joint pain.  Skin: Negative.  Negative for itching and rash.  Neurological: Negative for dizziness, tingling, focal weakness, weakness and headaches.  Endo/Heme/Allergies: Does not bruise/bleed easily.  Psychiatric/Behavioral: Negative for depression. The patient is not nervous/anxious and does not have insomnia.      MEDICAL HISTORY:  Past Medical History:  Diagnosis Date  . Dyslipidemia   . Family history of bladder cancer   . Graves disease   . Hyperthyroidism   . Inborn lipid storage disorder   . Leiomyoma of uterus   . Vitamin D deficiency     SURGICAL HISTORY: Past Surgical History:  Procedure Laterality Date  . APPENDECTOMY  1982   Cyst removed from ovaries   . BREAST BIOPSY Right 07/05/2019   stereo bx, pending path, coil clip   . BREAST BIOPSY Right 07/05/2019   stereo bx, pending path, ribbon clip   . COLONOSCOPY WITH PROPOFOL N/A 01/20/2017   Procedure: COLONOSCOPY WITH PROPOFOL;  Surgeon: AJonathon Bellows MD;  [facility]  Service: Gastroenterology;  Laterality: N/A;  . [place] . NECK SURGERY  2013   LIft from weight loss  . TONSILLECTOMY  1969    SOCIAL HISTORY: Social History   Socioeconomic History  . Marital status: Married    Spouse name: Not on file  . Number of children: 0  . Years of  education: Anselm Lis  . Highest education level: Not on file  Occupational History  . Occupation: Full-Time    Comment: [employer] x9  Tobacco Use  . Smoking status: Former Research scientist (life sciences)  . Smokeless tobacco: Never Used  Substance and Sexual Activity   . Alcohol use: No    Alcohol/week: 0.0 standard drinks  . Drug use: No  . Sexual activity: Yes    Birth control/protection: Post-menopausal  Other Topics Concern  . Not on file  Social History Narrative   Lives close to [place]; with husband. Quit smoking in 2007 [25 years]; ocassional alcohol. 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    Social Determinants of Health   Financial Resource Strain:   . Difficulty of Paying Living Expenses:   Food Insecurity:   . Worried About Charity fundraiser in the Last Year:   . Arboriculturist in the Last Year:   Transportation Needs:   . Film/video editor (Medical):   Marland Kitchen Lack of Transportation (Non-Medical):   Physical Activity:   . Days of Exercise per Week:   . Minutes of Exercise per Session:   Stress:   . Feeling of Stress :   Social Connections:   . Frequency of Communication with Friends and Family:   . Frequency of Social Gatherings with Friends and Family:   . Attends Religious Services:   . Active Member of Clubs or Organizations:   . Attends Archivist Meetings:   Marland Kitchen Marital Status:   Intimate Partner Violence:   . Fear of Current or Ex-Partner:   . Emotionally Abused:   Marland Kitchen Physically Abused:   . Sexually Abused:     FAMILY HISTORY: Family History  Problem Relation Age of Onset  . Bladder Cancer Mother   . Diabetes Father   . Heart disease Father   . Bladder Cancer Brother   . Breast cancer Neg Hx     ALLERGIES:  is allergic to penicillins.  MEDICATIONS:  Current Outpatient Medications  Medication Sig Dispense Refill  . B COMPLEX VITAMINS PO Take 1 tablet by mouth daily.     . Cholecalciferol (VITAMIN D) 125 MCG (5000 UT) CAPS Take 1 capsule by mouth daily.    . methimazole (TAPAZOLE) 5 MG tablet Take 5 mg by mouth daily.     . Multiple Vitamin (MULTI-VITAMINS) TABS Take by mouth.    . Omega 3-6-9 Fatty Acids (OMEGA 3-6-9 COMPLEX) CAPS Take 1 capsule by mouth daily.    . valACYclovir (VALTREX) 500 MG tablet Take 500  mg by mouth 2 (two) times a week.      No current facility-administered medications for this visit.    PHYSICAL EXAMINATION: ECOG PERFORMANCE STATUS: 0 - Asymptomatic  Vitals:   07/15/19 1117  BP: 136/84  Pulse: 73  Temp: (!) 97.2 F (36.2 C)   Filed Weights   07/12/19 1600 07/15/19 1117  Weight: 185 lb (83.9 kg) 185 lb (83.9 kg)    Physical Exam  Constitutional: She is oriented to person, place, and time and well-developed, well-nourished, and in no distress.  HENT:  Head: Normocephalic and atraumatic.  Mouth/Throat: Oropharynx is clear and moist. No oropharyngeal exudate.  Eyes: Pupils are equal, round, and reactive to light.  Cardiovascular: Normal rate and regular rhythm.  Pulmonary/Chest: Effort normal and breath sounds normal. No respiratory distress. She has no wheezes.  Abdominal: Soft. Bowel sounds are normal. She exhibits no distension and no mass. There is no abdominal tenderness. There is  no rebound and no guarding.  Musculoskeletal:        General: No tenderness or edema. Normal range of motion.     Cervical back: Normal range of motion and neck supple.  Neurological: She is alert and oriented to person, place, and time.  Skin: Skin is warm.  Psychiatric: Affect normal.   LABORATORY DATA:  I have reviewed the data as listed Lab Results  Component Value Date   WBC 7.3 07/15/2019   HGB 13.4 07/15/2019   HCT 41.7 07/15/2019   MCV 88.3 07/15/2019   PLT 270 07/15/2019   Recent Labs    07/15/19 1205  NA 137  K 4.4  CL 103  CO2 25  GLUCOSE 99  BUN 18  CREATININE 0.85  CALCIUM 9.9  GFRNONAA >60  GFRAA >60  PROT 8.1  ALBUMIN 4.7  AST 87*  ALT 106*  ALKPHOS 90  BILITOT 0.7    RADIOGRAPHIC STUDIES: I have personally reviewed the radiological images as listed and agreed with the findings in the report. MM Digital Diagnostic Unilat R  Result Date: 06/25/2019 CLINICAL DATA:  Screening recall for right breast calcifications. EXAM: DIGITAL DIAGNOSTIC  UNILATERAL RIGHT MAMMOGRAM WITH CAD AND TOMO RIGHT BREAST ULTRASOUND COMPARISON:  Previous exam(s). ACR Breast Density Category c: The breast tissue is heterogeneously dense, which may obscure Tangney masses. FINDINGS: Spot compression magnification views were performed of the lower outer right breast. There is a 0.7 cm group of predominantly amorphous calcifications measuring 0.7 cm with an associated density/possible mass. Additional faint linear oriented calcifications are seen extending 2.1 cm anterior to these calcifications, possibly but not definitely vascular. Mammographic images were processed with CAD. Targeted ultrasound of the outer right breast was performed. Several cysts and clusters of cysts are identified within the outer right breast. A cluster of cysts at the 10:30 position 4 cm from nipple measures 0.5 x 0.3 x 0.4 cm and an additional cluster of cysts at the 9:30 position 3 cm from nipple measures 0.8 x 0.4 x 0.6 cm. One of these areas of fibrocystic change could but not definitely correspond with the Gibb asymmetry/mass with associated calcifications seen in the right breast. No lymphadenopathy seen in the right axilla. IMPRESSION: 1. Suspicious 0.7 cm group of calcifications with associated mass/density within the outer right breast. 2. Indeterminate calcifications spanning 2.1 cm extending anteriorly from this smaller group of calcifications, possibly but not definitely vascular. RECOMMENDATION: 1. Recommend stereotactic guided biopsy of the 0.7 cm group of calcifications with associated density within the outer right breast. 2. Recommend stereotactic guided biopsy of the additional 2.1 cm group of calcifications in the outer right breast located anterior to the smaller group of calcifications. I have discussed the findings and recommendations with the patient. If applicable, a reminder letter will be sent to the patient regarding the next appointment. BI-RADS CATEGORY  4: Suspicious.  Electronically Signed   By: Everlean Alstrom M.D.   On: 06/25/2019 12:22   US BREAST LTD UNI RIGHT INC AXILLA  Result Date: 06/25/2019 CLINICAL DATA:  Screening recall for right breast calcifications. EXAM: DIGITAL DIAGNOSTIC UNILATERAL RIGHT MAMMOGRAM WITH CAD AND TOMO RIGHT BREAST ULTRASOUND COMPARISON:  Previous exam(s). ACR Breast Density Category c: The breast tissue is heterogeneously dense, which may obscure Tauer masses. FINDINGS: Spot compression magnification views were performed of the lower outer right breast. There is a 0.7 cm group of predominantly amorphous calcifications measuring 0.7 cm with an associated density/possible mass. Additional faint linear oriented calcifications are seen  extending 2.1 cm anterior to these calcifications, possibly but not definitely vascular. Mammographic images were processed with CAD. Targeted ultrasound of the outer right breast was performed. Several cysts and clusters of cysts are identified within the outer right breast. A cluster of cysts at the 10:30 position 4 cm from nipple measures 0.5 x 0.3 x 0.4 cm and an additional cluster of cysts at the 9:30 position 3 cm from nipple measures 0.8 x 0.4 x 0.6 cm. One of these areas of fibrocystic change could but not definitely correspond with the Imes asymmetry/mass with associated calcifications seen in the right breast. No lymphadenopathy seen in the right axilla. IMPRESSION: 1. Suspicious 0.7 cm group of calcifications with associated mass/density within the outer right breast. 2. Indeterminate calcifications spanning 2.1 cm extending anteriorly from this smaller group of calcifications, possibly but not definitely vascular. RECOMMENDATION: 1. Recommend stereotactic guided biopsy of the 0.7 cm group of calcifications with associated density within the outer right breast. 2. Recommend stereotactic guided biopsy of the additional 2.1 cm group of calcifications in the outer right breast located anterior to the smaller  group of calcifications. I have discussed the findings and recommendations with the patient. If applicable, a reminder letter will be sent to the patient regarding the next appointment. BI-RADS CATEGORY  4: Suspicious. Electronically Signed   By: Everlean Alstrom M.D.   On: 06/25/2019 12:22   MM 3D SCREEN BREAST BILATERAL  Result Date: 06/19/2019 CLINICAL DATA:  Screening. EXAM: DIGITAL SCREENING BILATERAL MAMMOGRAM WITH TOMO AND CAD COMPARISON:  Previous exam(s). ACR Breast Density Category c: The breast tissue is heterogeneously dense, which may obscure Fetters masses. FINDINGS: In the right breast, calcifications warrant further evaluation with magnified views. In the left breast, no findings suspicious for malignancy. Images were processed with CAD. IMPRESSION: Further evaluation is suggested for calcifications in the right breast. RECOMMENDATION: Diagnostic mammogram of the right breast. (Code:[id number]) The patient will be contacted regarding the findings, and additional imaging will be scheduled. BI-RADS CATEGORY  0: Incomplete. Need additional imaging evaluation and/or prior mammograms for comparison. Electronically Signed   By: Everlean Alstrom M.D.   On: 06/19/2019 10:26   MM CLIP PLACEMENT RIGHT  Result Date: 07/05/2019 CLINICAL DATA:  Post biopsy mammogram of the right breast for clip placement. EXAM: DIAGNOSTIC RIGHT MAMMOGRAM POST STEREOTACTIC BIOPSY COMPARISON:  Previous exam(s). FINDINGS: Mammographic images were obtained following stereotactic guided biopsy of calcifications in the lateral right breast. The biopsy marking clips are in expected position at the sites of biopsy. The two clips are 3 cm apart on the true lateral view. IMPRESSION: Appropriate positioning of the coil shaped biopsy marking clip at the site of biopsy in the lateral posterior right breast and the ribbon shaped biopsy marking clip at the site of biopsy in the lateral middle depth of the right breast. Final Assessment: Post  Procedure Mammograms for Marker Placement Electronically Signed   By: Ammie Ferrier M.D.   On: 07/05/2019 14:06   MM RT BREAST BX W LOC DEV 1ST LESION IMAGE BX SPEC STEREO GUIDE  Addendum Date: 07/09/2019   ADDENDUM REPORT: 07/08/2019 16:39 ADDENDUM: PATHOLOGY revealed: A. BREAST, RIGHT, LATERAL POSTERIOR (COIL-SHAPED CLIP); STEREOTACTIC-GUIDED CORE BIOPSY: - INVASIVE MAMMARY CARCINOMA, NO SPECIAL TYPE, ASSOCIATED WITH CALCIFICATIONS. 4 mm in this sample. Grade 2. Ductal carcinoma in situ: Not identified in this sample. Lymphovascular invasion: Not identified. The definitive grade will be assigned on the excisional specimen. B. BREAST, RIGHT, LATERAL MIDDLE DEPTH (RIBBON-SHAPED CLIP): STEREOTACTIC-GUIDED CORE BIOPSY: -  DUCTAL CARCINOMA IN SITU (DCIS), HIGH-GRADE, ASSOCIATED WITH CALCIFICATIONS. Pathology results are CONCORDANT with imaging findings, per Dr. Ammie Ferrier. Pathology results and recommendations below were discussed with patient by telephone on 07/08/2019. Patient reported biopsy site doing well with slight tenderness at the site. Post biopsy care instructions were reviewed and questions were answered. Patient was instructed to call [facility] if any concerns or questions arise related to the biopsy. Recommendation: Surgical referral and bilateral breast MRI due to high grade DCIS at second biopsy site. Request for surgical referral was relayed to [place] and Tanya Nones RN at [facility] by Electa Sniff RN on 07/08/2019. Addendum by Electa Sniff RN on 07/08/2019. Electronically Signed   By: Ammie Ferrier M.D.   On: 07/08/2019 16:39   Result Date: 07/09/2019 CLINICAL DATA:  59 year old female presenting for stereotactic biopsy of right breast calcifications. EXAM: RIGHT BREAST STEREOTACTIC CORE NEEDLE BIOPSY COMPARISON:  Previous exams. FINDINGS: The patient and I discussed the procedure of stereotactic-guided biopsy including benefits and  alternatives. We discussed the high likelihood of a successful procedure. We discussed the risks of the procedure including infection, bleeding, tissue injury, clip migration, and inadequate sampling. Informed written consent was given. The usual time out protocol was performed immediately prior to the procedure. Using sterile technique and 1% Lidocaine as local anesthetic, under stereotactic guidance, a 9 gauge vacuum assisted device was used to perform core needle biopsy of calcifications in the lateral posterior right breast using a lateral approach. Specimen radiograph was performed showing calcifications in multiple core samples. Specimens with calcifications are identified for pathology. Lesion quadrant: Lower outer quadrant At the conclusion of the procedure, coil shaped tissue marker clip was deployed into the biopsy cavity. --------------------------------------------- Using sterile technique and 1% Lidocaine as local anesthetic, under stereotactic guidance, a 9 gauge vacuum assisted device was used to perform core needle biopsy of calcifications in the lateral middle depth of the right breast using a lateral approach. Specimen radiograph was performed showing calcifications in multiple core samples. Specimens with calcifications are identified for pathology. Lesion quadrant: Lower outer quadrant At the conclusion of the procedure, ribbon shaped tissue marker clip was deployed into the biopsy cavity. Follow-up 2-view mammogram was performed and dictated separately. IMPRESSION: Stereotactic-guided biopsy of the posterior and anterior margin of calcifications in the lateral right breast. No apparent complications. Electronically Signed: By: Ammie Ferrier M.D. On: 07/05/2019 14:04   MM RT BREAST BX W LOC DEV EA AD LESION IMG BX SPEC STEREO GUIDE  Addendum Date: 07/09/2019   ADDENDUM REPORT: 07/08/2019 16:39 ADDENDUM: PATHOLOGY revealed: A. BREAST, RIGHT, LATERAL POSTERIOR (COIL-SHAPED CLIP);  STEREOTACTIC-GUIDED CORE BIOPSY: - INVASIVE MAMMARY CARCINOMA, NO SPECIAL TYPE, ASSOCIATED WITH CALCIFICATIONS. 4 mm in this sample. Grade 2. Ductal carcinoma in situ: Not identified in this sample. Lymphovascular invasion: Not identified. The definitive grade will be assigned on the excisional specimen. B. BREAST, RIGHT, LATERAL MIDDLE DEPTH (RIBBON-SHAPED CLIP): STEREOTACTIC-GUIDED CORE BIOPSY: - DUCTAL CARCINOMA IN SITU (DCIS), HIGH-GRADE, ASSOCIATED WITH CALCIFICATIONS. Pathology results are CONCORDANT with imaging findings, per Dr. Ammie Ferrier. Pathology results and recommendations below were discussed with patient by telephone on 07/08/2019. Patient reported biopsy site doing well with slight tenderness at the site. Post biopsy care instructions were reviewed and questions were answered. Patient was instructed to call [facility] if any concerns or questions arise related to the biopsy. Recommendation: Surgical referral and bilateral breast MRI due to high grade DCIS at second biopsy site. Request for  surgical referral was relayed to [place] and Tanya Nones RN at [facility] by Electa Sniff RN on 07/08/2019. Addendum by Electa Sniff RN on 07/08/2019. Electronically Signed   By: Ammie Ferrier M.D.   On: 07/08/2019 16:39   Result Date: 07/09/2019 CLINICAL DATA:  59 year old female presenting for stereotactic biopsy of right breast calcifications. EXAM: RIGHT BREAST STEREOTACTIC CORE NEEDLE BIOPSY COMPARISON:  Previous exams. FINDINGS: The patient and I discussed the procedure of stereotactic-guided biopsy including benefits and alternatives. We discussed the high likelihood of a successful procedure. We discussed the risks of the procedure including infection, bleeding, tissue injury, clip migration, and inadequate sampling. Informed written consent was given. The usual time out protocol was performed immediately prior to the procedure. Using sterile technique and 1%  Lidocaine as local anesthetic, under stereotactic guidance, a 9 gauge vacuum assisted device was used to perform core needle biopsy of calcifications in the lateral posterior right breast using a lateral approach. Specimen radiograph was performed showing calcifications in multiple core samples. Specimens with calcifications are identified for pathology. Lesion quadrant: Lower outer quadrant At the conclusion of the procedure, coil shaped tissue marker clip was deployed into the biopsy cavity. --------------------------------------------- Using sterile technique and 1% Lidocaine as local anesthetic, under stereotactic guidance, a 9 gauge vacuum assisted device was used to perform core needle biopsy of calcifications in the lateral middle depth of the right breast using a lateral approach. Specimen radiograph was performed showing calcifications in multiple core samples. Specimens with calcifications are identified for pathology. Lesion quadrant: Lower outer quadrant At the conclusion of the procedure, ribbon shaped tissue marker clip was deployed into the biopsy cavity. Follow-up 2-view mammogram was performed and dictated separately. IMPRESSION: Stereotactic-guided biopsy of the posterior and anterior margin of calcifications in the lateral right breast. No apparent complications. Electronically Signed: By: Ammie Ferrier M.D. On: 07/05/2019 14:04   ASSESSMENT & PLAN:   Carcinoma of upper-outer quadrant of right breast in female, estrogen receptor positive ([place]) # -invasive mammary carcinoma 7 mm stage I ER/PR positive; HER-2 pending breast cancer; #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  #Await MRI of the breast for further evaluation-to guide surgical options..  Also await HER-2/neu testing to help guide systemic therapy options.  #  I had a long discussion with the patient in general regarding the treatment options of breast cancer including-surgery; adjuvant radiation; role of adjuvant systemic therapy  including-chemotherapy antihormone therapy.  #  Patient will likely need lumpectomy with sentinel lymph node evaluation; followed by radiation. Decision regarding chemotherapy based on final surgical pathology/gene assay. Patient will benefit from antihormone therapy.  I discussed the potential benefits of each option; and also potential downsides in detail.  #Hypothyroidism-methimazole stable.  Check labs  Thank you for allowing me to participate in the care of your pleasant patient. Please do not hesitate to contact me with questions or concerns in the interim.  Discussed with Webb Silversmith.  Discussed with the patient and husband detail.  They are in agreement.  # DISPOSITION: # labs today- cbc/cmp; PT/PTT # follow up to be decided-Dr.B  Cc: Dr.Cintron/Dr.Fisher.   All questions were answered. The patient/family knows to call the clinic with any problems, questions or concerns.    Cammie Sickle, MD 07/15/2019 12:34 PM

## 2019-07-15 NOTE — Assessment & Plan Note (Addendum)
# -  invasive mammary carcinoma 7 mm stage I ER/PR positive; HER-2 pending breast cancer; #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  #Await MRI of the breast for further evaluation-to guide surgical options..  Also await HER-2/neu testing to help guide systemic therapy options.  #  I had a long discussion with the patient in general regarding the treatment options of breast cancer including-surgery; adjuvant radiation; role of adjuvant systemic therapy including-chemotherapy antihormone therapy.  #  Patient will likely need lumpectomy with sentinel lymph node evaluation; followed by radiation. Decision regarding chemotherapy based on final surgical pathology/gene assay. Patient will benefit from antihormone therapy.  I discussed the potential benefits of each option; and also potential downsides in detail.  #Hypothyroidism-methimazole stable.  Check labs  Thank you for allowing me to participate in the care of your pleasant patient. Please do not hesitate to contact me with questions or concerns in the interim.  Discussed with Webb Silversmith.  Discussed with the patient and husband detail.  They are in agreement.  # DISPOSITION: # labs today- cbc/cmp; PT/PTT # follow up to be decided-Dr.B  Cc: Dr.Cintron/Dr.Fisher.

## 2019-07-17 ENCOUNTER — Telehealth: Payer: Self-pay | Admitting: Internal Medicine

## 2019-07-17 DIAGNOSIS — R7989 Other specified abnormal findings of blood chemistry: Secondary | ICD-10-CM

## 2019-07-17 NOTE — Telephone Encounter (Signed)
On 3/30-spoke to patient that her LFTs are slightly off.  Recommend hepatitis work-up/ultrasound liver [ordered]  C-schedule labs; ultrasound ASAP.  Follow-up with MD-VIRTUAL-1 to 2 days after ultrasound  is done.

## 2019-07-18 DIAGNOSIS — C50919 Malignant neoplasm of unspecified site of unspecified female breast: Secondary | ICD-10-CM

## 2019-07-18 HISTORY — DX: Malignant neoplasm of unspecified site of unspecified female breast: C50.919

## 2019-07-19 ENCOUNTER — Other Ambulatory Visit: Payer: Self-pay

## 2019-07-19 ENCOUNTER — Inpatient Hospital Stay: Payer: 59 | Attending: Internal Medicine

## 2019-07-19 DIAGNOSIS — C50411 Malignant neoplasm of upper-outer quadrant of right female breast: Secondary | ICD-10-CM | POA: Diagnosis not present

## 2019-07-19 DIAGNOSIS — Z87891 Personal history of nicotine dependence: Secondary | ICD-10-CM | POA: Insufficient documentation

## 2019-07-19 DIAGNOSIS — R7401 Elevation of levels of liver transaminase levels: Secondary | ICD-10-CM | POA: Diagnosis not present

## 2019-07-19 DIAGNOSIS — E0590 Thyrotoxicosis, unspecified without thyrotoxic crisis or storm: Secondary | ICD-10-CM | POA: Insufficient documentation

## 2019-07-19 DIAGNOSIS — R7989 Other specified abnormal findings of blood chemistry: Secondary | ICD-10-CM

## 2019-07-19 DIAGNOSIS — Z170 Estrogen receptor positive status [ER+]: Secondary | ICD-10-CM | POA: Insufficient documentation

## 2019-07-19 DIAGNOSIS — K760 Fatty (change of) liver, not elsewhere classified: Secondary | ICD-10-CM | POA: Diagnosis not present

## 2019-07-19 DIAGNOSIS — Z8052 Family history of malignant neoplasm of bladder: Secondary | ICD-10-CM | POA: Insufficient documentation

## 2019-07-19 LAB — HEPATITIS PANEL, ACUTE
HCV Ab: NONREACTIVE
Hep A IgM: NONREACTIVE
Hep B C IgM: NONREACTIVE
Hepatitis B Surface Ag: NONREACTIVE

## 2019-07-22 ENCOUNTER — Other Ambulatory Visit: Payer: Self-pay

## 2019-07-22 ENCOUNTER — Ambulatory Visit
Admission: RE | Admit: 2019-07-22 | Discharge: 2019-07-22 | Disposition: A | Discharge status: Home / Self Care | Payer: 59 | Source: Ambulatory Visit | Attending: Internal Medicine | Admitting: Internal Medicine

## 2019-07-22 ENCOUNTER — Encounter: Payer: Self-pay | Admitting: Internal Medicine

## 2019-07-22 DIAGNOSIS — R7989 Other specified abnormal findings of blood chemistry: Secondary | ICD-10-CM | POA: Diagnosis not present

## 2019-07-22 DIAGNOSIS — C50919 Malignant neoplasm of unspecified site of unspecified female breast: Secondary | ICD-10-CM | POA: Diagnosis not present

## 2019-07-23 ENCOUNTER — Inpatient Hospital Stay (HOSPITAL_BASED_OUTPATIENT_CLINIC_OR_DEPARTMENT_OTHER): Payer: 59 | Admitting: Internal Medicine

## 2019-07-23 ENCOUNTER — Encounter: Payer: Self-pay | Admitting: Body Imaging

## 2019-07-23 ENCOUNTER — Other Ambulatory Visit: Payer: Self-pay

## 2019-07-23 ENCOUNTER — Other Ambulatory Visit: Payer: Self-pay | Admitting: General Surgery

## 2019-07-23 ENCOUNTER — Ambulatory Visit
Admission: RE | Admit: 2019-07-23 | Discharge: 2019-07-23 | Disposition: A | Discharge status: Home / Self Care | Payer: 59 | Source: Ambulatory Visit | Attending: General Surgery | Admitting: General Surgery

## 2019-07-23 DIAGNOSIS — Z170 Estrogen receptor positive status [ER+]: Secondary | ICD-10-CM

## 2019-07-23 DIAGNOSIS — C50911 Malignant neoplasm of unspecified site of right female breast: Secondary | ICD-10-CM | POA: Diagnosis not present

## 2019-07-23 DIAGNOSIS — C50411 Malignant neoplasm of upper-outer quadrant of right female breast: Secondary | ICD-10-CM | POA: Diagnosis not present

## 2019-07-23 LAB — SURGICAL PATHOLOGY

## 2019-07-23 MED ORDER — GADOBUTROL 1 MMOL/ML IV SOLN
8.0000 mL | Freq: Once | INTRAVENOUS | Status: AC | PRN
  Administered 2019-07-23: 8 mL via INTRAVENOUS

## 2019-07-23 NOTE — Assessment & Plan Note (Addendum)
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  MRI- on 4/06-right breast shows 3x3 cm area of nonenhancing area.   # Given the findings of HER-2/neu positive disease-discussed with the patient that she will need adjuvant chemotherapy post surgery.  Discussed that treatment would include Herceptin-and treatments last for about a total of 1 year.  The type of chemotherapy backbone-will depend upon final pathology.  Also discussed that she will need a port placement.  Will discuss with Dr. Peyton Najjar.  #Post lumpectomy and sentinel lymph node biopsy-patient also need adjuvant radiation.  #Slightly elevated AST ALT x 2 times times normal limits; normal bilirubin/alkaline phosphatase-ultrasound abdomen/liver-negative for any acute process.  Positive for hepatic steatosis/fatty liver as a potential cause.  Discussed that this will need to be further evaluated/followed up after surgery acute issues resolve.   #Hyperthyroidism-methimazole-CBC normal.  #Disposition: #To be decided  Cc: Dr.Cintron

## 2019-07-23 NOTE — Progress Notes (Signed)
I connected with Connie Bradford on 07/23/2019 at  3:30 PM EDT by video enabled telemedicine visit and verified that I am speaking with the correct person using two identifiers.  I discussed the limitations, risks, security and privacy concerns of performing an evaluation and management service by telemedicine and the availability of in-person appointments. I also discussed with the patient that there may be a patient responsible charge related to this service. The patient expressed understanding and agreed to proceed.    Other persons participating in the visit and their role in the encounter: RN/medical reconciliation Patient's location: Home Provider's location: Office  Oncology History Overview Note  # MARCH 2021- RUOQ- 57m- [BREAST, RIGHT, LATERAL POSTERIOR  - INVASIVE MAMMARY CARCINOMA, NO SPECIAL TYPE, ASSOCIATED WITH  CALCIFICATIONS.][place] ER-> 90%; PR- 50-90%; HER 2 FISH- POSITIVE  . BREAST, RIGHT, LATERAL MIDDLE DEPTH (RIBBON-SHAPED CLIP):  STEREOTACTIC-GUIDED CORE BIOPSY: - DUCTAL CARCINOMA IN SITU (DCIS), HIGH-GRADE, ASSOCIATED WITH  CALCIFICATIONS.   #Hypothyroidism-methimazole [Dr.Solum]  # SURVIVORSHIP:   # GENETICS:   DIAGNOSIS:   STAGE:         ;  GOALS:  CURRENT/MOST RECENT THERAPY :     Carcinoma of upper-outer quadrant of right breast in female, estrogen receptor positive ([facility]  07/15/2019 Initial Diagnosis   Carcinoma of upper-outer quadrant of right breast in female, estrogen receptor positive ([facility]      Chief Complaint: Breast cancer   History of present illness:Connie Bradford 59y.o.  female with history of recently diagnosed breast cancer ER/PR positive HER-2/neu pending clinical stage I is here for follow-up given her elevated LFTs.  Patient on routine work-up noted to have elevated LFTs AST ALT x2 upper normal limit with normal bilirubin and alkaline phosphatase.   Patient denies any abdominal pain nausea vomiting.  She is quite anxious given her  upcoming surgery-plan for next week.  Observation/objective:Results for Fusilier, AAUGUSTA MIRKIN(MRN [id number] as of 07/24/2019 07:15  Ref. Range 07/05/2019 14:02 07/15/2019 12:05 07/19/2019 07:56 07/22/2019 09:19 07/23/2019 08:22  Alkaline Phosphatase Latest Ref Range: 38 - 126 U/L  90     Albumin Latest Ref Range: 3.5 - 5.0 g/dL  4.7     AST Latest Ref Range: 15 - 41 U/L  87 (H)     ALT Latest Ref Range: 0 - 44 U/L  106 (H)     Total Protein Latest Ref Range: 6.5 - 8.1 g/dL  8.1     Total Bilirubin Latest Ref Range: 0.3 - 1.2 mg/dL  0.7       Assessment and plan: Carcinoma of upper-outer quadrant of right breast in female, estrogen receptor positive (HCC) #Right breast-invasive mammary carcinoma 7 mm stage I ER/PR positive; HER-2 by FISH-POSITIVE breast cancer; #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  MRI- on 4/06-right breast shows 3x3 cm area of nonenhancing area.   # Given the findings of HER-2/neu positive disease-discussed with the patient that she will need adjuvant chemotherapy post surgery.  Discussed that treatment would include Herceptin-and treatments last for about a total of 1 year.  The type of chemotherapy backbone-will depend upon final pathology.  Also discussed that she will need a port placement.  Will discuss with Dr. CPeyton Najjar  #Post lumpectomy and sentinel lymph node biopsy-patient also need adjuvant radiation.  #Slightly elevated AST ALT x 2 times times normal limits; normal bilirubin/alkaline phosphatase-ultrasound abdomen/liver-negative for any acute process.  Positive for hepatic steatosis/fatty liver as a potential cause.  Discussed that this will need to be further evaluated/followed up after  surgery acute issues resolve.   #Hyperthyroidism-methimazole-CBC normal.  #Disposition: #To be decided  Cc: Dr.Cintron  Follow-up instructions:  I discussed the assessment and treatment plan with the patient.  The patient was provided an opportunity to ask questions and all were answered.  The  patient agreed with the plan and demonstrated understanding of instructions.  The patient was advised to call back or seek an in person evaluation if the symptoms worsen or if the condition fails to improve as anticipated.  Dr. [name] [place] at [facility] 07/24/2019 7:28 AM

## 2019-07-24 ENCOUNTER — Telehealth: Payer: Self-pay | Admitting: Internal Medicine

## 2019-07-24 ENCOUNTER — Telehealth: Payer: 59 | Admitting: Internal Medicine

## 2019-07-24 ENCOUNTER — Other Ambulatory Visit: Payer: Self-pay | Admitting: General Surgery

## 2019-07-24 DIAGNOSIS — C50411 Malignant neoplasm of upper-outer quadrant of right female breast: Secondary | ICD-10-CM

## 2019-07-24 NOTE — Telephone Encounter (Signed)
Spoke to Dr. Guy Begin regarding breast MRI-subtle enhancement/but no obvious masslike lesion noted in the breast.  Given the [place] invasive component noted on the mammogram/ultrasound-proceed with surgery.  Also need a port.  Informed Dr. Peyton Najjar.

## 2019-07-24 NOTE — Telephone Encounter (Signed)
Discussed with Dr. Kym Groom of MRI in the context of HER-2/neu positive disease.  It would be very helpful to decide on neoadjuvant versus adjuvant [also type of adjuvant therapy]-if the MRI is able to delineate between invasive versus noninvasive DCIS.  Dr. Peyton Najjar agrees.  Will reach out to radiology for further input.

## 2019-07-25 ENCOUNTER — Telehealth: Payer: Self-pay | Admitting: Internal Medicine

## 2019-07-25 ENCOUNTER — Other Ambulatory Visit: Admission: RE | Admit: 2019-07-25 | Payer: 59 | Source: Ambulatory Visit

## 2019-07-25 NOTE — Telephone Encounter (Signed)
On 04/07-spoke to patient regarding the plan for upfront surgery; followed by decisions regarding type of adjuvant therapy.  Patient need a port.  Discussed with Dr. Peyton Najjar.  FYI- Sheena/Anne- I added to for tumor conference for 4/12 to review her imaging findings. Surgery planned on 4/14.

## 2019-07-26 ENCOUNTER — Ambulatory Visit: Payer: Self-pay | Admitting: General Surgery

## 2019-07-26 ENCOUNTER — Other Ambulatory Visit: Payer: Self-pay

## 2019-07-26 ENCOUNTER — Encounter
Admission: RE | Admit: 2019-07-26 | Discharge: 2019-07-26 | Disposition: A | Discharge status: Home / Self Care | Payer: 59 | Source: Ambulatory Visit | Attending: General Surgery | Admitting: General Surgery

## 2019-07-26 NOTE — Patient Instructions (Signed)
Your procedure is scheduled on: Wednesday 07-31-2019  PLEASE CHECK IN AT [place] AT 7:45AM YOUR FAMILY MEMBER WILL NEED TO CHECK IN AT THE MEDICAL MALL IF THEY ARE STAYING AT [facility] DURING YOUR PROCEDURE.   REMEMBER: Instructions that are not followed completely may result in serious medical risk, up to and including death; or upon the discretion of your surgeon and anesthesiologist your surgery may need to be rescheduled.  Do not eat food after midnight the night before surgery.  No gum chewing, lozengers or hard candies.  You may however, drink CLEAR liquids up to 2 hours before you are scheduled to arrive for your surgery. Do not drink anything within 2 hours of the start of your surgery.  Clear liquids include: - water  - apple juice without pulp - gatorade (not RED) - black coffee or tea (Do NOT add milk or creamers to the coffee or tea) Do NOT drink anything that is not on this list.  Type 1 and Type 2 diabetics should only drink water.    TAKE THESE MEDICATIONS THE MORNING OF SURGERY WITH A SIP OF WATER: TAPAZOLE  Stop Anti-inflammatories (NSAIDS) such as Advil, Aleve, Ibuprofen, Motrin, Naproxen, Naprosyn and ASPIRIN OR Aspirin based products such as Excedrin, Goodys Powder, BC Powder. (May take Tylenol or Acetaminophen if needed.)  Stop ANY OVER THE COUNTER supplements until after surgery. STOP COLLAGEN AND OMEGA FATTY ACIDS (May continue Vitamin D, Vitamin B, and multivitamin.)  No Alcohol for 24 hours before or after surgery.  No Smoking including e-cigarettes for 24 hours prior to surgery.  No chewable tobacco products for at least 6 hours prior to surgery.  No nicotine patches on the day of surgery.  On the morning of surgery brush your teeth with toothpaste and water, you may rinse your mouth with mouthwash if you wish. Do not swallow any toothpaste or mouthwash.  Do not wear jewelry, make-up, hairpins, clips or nail polish.  Do not wear  lotions, powders, or perfumes.   Do not shave 48 hours prior to surgery.   Contact lenses, hearing aids and dentures may not be worn into surgery.  Do not bring valuables to the hospital, including drivers license, insurance or credit cards.  [facility] is not responsible for any belongings or valuables.   Use CHG Soap  as directed on instruction sheet.  Notify your doctor if there is any change in your medical condition (cold, fever, infection).  Wear comfortable clothing (specific to your surgery type) to the hospital.  Plan for stool softeners for home use.  If you are being discharged the day of surgery, you will not be allowed to drive home. You will need a responsible adult to drive you home and stay with you that night.   If you are taking public transportation, you will need to have a responsible adult with you. Please confirm with your physician that it is acceptable to use public transportation.   Please call [phone] if you have any questions about these instructions.  Visitation Policy:  Patients undergoing a surgery or procedure in a hospital may have one family member or support person with them as long as that person is not COVID-19 positive or experiencing its symptoms. That person may remain in the waiting area during the procedure. Should the patient need to stay at the hospital during part of their recovery, the support person may visit during visiting hours; 7 am to 8 pm.  Children under 18 years  of age may have both parents or legal guardians with them during their hospital stay.   Inpatient Visitation Update:  Two designated support people may visit a patient during visiting hours 7 am to 8 pm. It must be the same two designated people for the duration of the patient stay. The visitors may come and go during the day, and there is no switching out to have different visitors. A mask must be worn at all times, including in the patient room.

## 2019-07-26 NOTE — H&P (View-Only) (Signed)
PATIENT PROFILE: ARCHER PAJARES is a 59 y.o. female who presents to the Clinic for consultation at the request of Dr. Kenton Kingfisher for evaluation of breast cancer.  PCP:  Birdie Sons, MD  HISTORY OF PRESENT ILLNESS: Ms. Ludwig reports she had her usual screening mammogram.  Screening mammogram was fully conclusive when she was called for diagnostic mammogram.  She had diagnostic mammogram of the right breast.  The diagnostic mammogram shows 2 set of calcifications on the right lateral aspect.  No masses or lesions seen on ultrasound.  I personally evaluated the images patient had stereotactic core biopsy.  The 2 sets of microcalcifications shows: 1.  Invasive mammary carcinoma and 2.  Ductal carcinoma in situ, high-grade.  Patient denies any palpable mass, skin changes, nipple retraction or discharge.  Family history of breast cancer: None Family history of other cancers: Bladder (mother) Menarche: 57 year old Menopause: 59 years old Used OCP: For more than 10 years Used estrogen and progesterone therapy: None History of Radiation to the chest: None Previous breast biopsy: None Number of pregnancies: 0  PROBLEM LIST:         Problem List  Date Reviewed: 12/05/2018         Noted   Graves' disease 05/17/2014      GENERAL REVIEW OF SYSTEMS:   General ROS: negative for - chills, fatigue, fever, weight gain or weight loss Allergy and Immunology ROS: negative for - hives  Hematological and Lymphatic ROS: negative for - bleeding problems or bruising, negative for palpable nodes Endocrine ROS: negative for - heat or cold intolerance, hair changes Respiratory ROS: negative for - cough, shortness of breath or wheezing Cardiovascular ROS: no chest pain or palpitations GI ROS: negative for nausea, vomiting, abdominal pain, diarrhea, constipation Musculoskeletal ROS: negative for - joint swelling or muscle pain Neurological ROS: negative for - confusion, syncope Dermatological ROS:  negative for pruritus and rash Psychiatric: negative for anxiety, depression, difficulty sleeping and memory loss  MEDICATIONS: Current Medications        Current Outpatient Medications  Medication Sig Dispense Refill  . cholecalciferol (CHOLECALCIFEROL) 1,000 unit tablet Take 5,000 Units by mouth once daily.    . methIMAzole (TAPAZOLE) 5 MG tablet Take 1 tablet (5 mg total) by mouth once daily 90 tablet 2  . multivitamin tablet Take 1 tablet by mouth once daily.    . OM-3/E/LINOL/ALA/OLEIC/GLA/LIP (OMEGA 3-6-9 ORAL) Take 1 capsule by mouth once daily.     Marland Kitchen VITAMIN B COMPLEX ORAL Take 1 tablet by mouth once daily     No current facility-administered medications for this visit.       ALLERGIES: Penicillins  PAST MEDICAL HISTORY:     Past Medical History:  Diagnosis Date  . Goiter   . Graves' disease onset 08/2010  . History of cancer 07/05/19  . Hyperlipidemia   . Hyperthyroidism, unspecified    likely due to Graves' disease  . Prediabetes     PAST SURGICAL HISTORY:      Past Surgical History:  Procedure Laterality Date  . APPENDECTOMY  09/16/1980  . COLONOSCOPY  02/19/2013   Hyperplastic polyps - Repeat 10 years  . Neck surgery  2013   Had lift of skin from weight loss.  . Ovarian cyst removed  1982  . TONSILLECTOMY  1974     FAMILY HISTORY:      Family History  Problem Relation Age of Onset  . Cancer Mother        bladder  . Diabetes  Father   . Diabetes Brother   . Diabetes Brother      SOCIAL HISTORY: Social History          Socioeconomic History  . Marital status: Married    Spouse name: Not on file  . Number of children: Not on file  . Years of education: Not on file  . Highest education level: Not on file  Occupational History  . Not on file  Social Needs  . Financial resource strain: Not on file  . Food insecurity    Worry: Not on file    Inability: Not on file  . Transportation needs     Medical: Not on file    Non-medical: Not on file  Tobacco Use  . Smoking status: Former Smoker    Packs/day: 1.00    Years: 25.00    Pack years: 25.00    Types: Cigarettes    Start date: 09/16/1980    Quit date: 09/16/2005    Years since quitting: 13.8  . Smokeless tobacco: Never Used  . Tobacco comment: Quit smoking 2007  Substance and Sexual Activity  . Alcohol use: No    Alcohol/week: 0.0 standard drinks  . Drug use: No  . Sexual activity: Not Currently    Partners: Male    Birth control/protection: None  Other Topics Concern  . Not on file  Social History Narrative  . Not on file      PHYSICAL EXAM:    Vitals:   07/10/19 1455  BP: 156/77  Pulse: 73   Body mass index is 29.13 kg/m. Weight: 84.4 kg (186 lb)   GENERAL: Alert, active, oriented x3  HEENT: Pupils equal reactive to light. Extraocular movements are intact. Sclera clear. Palpebral conjunctiva normal red color.Pharynx clear.  NECK: Supple with no palpable mass and no adenopathy.  LUNGS: Sound clear with no rales rhonchi or wheezes.  HEART: Regular rhythm S1 and S2 without murmur.  BREAST: breasts appear normal, no suspicious masses, no skin or nipple changes or axillary nodes.  ABDOMEN: Soft and depressible, nontender with no palpable mass, no hepatomegaly.  EXTREMITIES: Well-developed well-nourished symmetrical with no dependent edema.  NEUROLOGICAL: Awake alert oriented, facial expression symmetrical, moving all extremities.  REVIEW OF DATA: I have reviewed the following data today:      No visits with results within 3 Month(s) from this visit.  Latest known visit with results is:  Appointment on 04/01/2019  Component Date Value  . Thyroid Stimulating Horm* 04/01/2019 2.067   . Thyroxine, Free (FT4) 04/01/2019 0.71      ASSESSMENT: Ms. Spaude is a 59 y.o. female presenting for consultation for wrist cancer.    Patient was oriented again about the  pathology results. Surgical alternatives were discussed with patient including partial vs total mastectomy. Surgical technique and post operative care was discussed with patient. Risk of surgery was discussed with patient including but not limited to: wound infection, seroma, hematoma, brachial plexopathy, mondor's disease (thrombosis of Noh veins of breast), chronic wound pain, breast lymphedema, altered sensation to the nipple and cosmesis among others.   Due to patient's 2 areas with cancer confirm including a large area with DCIS, MRI of the breast was recommended by radiologist.  Will order MRI.  The receptors of the cancers has come out yet.  Patient will see medical oncologist next week.  After the MRI results and the receptor results and complete discussion with medical oncologist will call back the patient and discuss to proceed between partial  mastectomy versus total mastectomy, and sentinel lymph node biopsy.  Malignant neoplasm of upper-outer quadrant of right female breast, unspecified estrogen receptor status (CMS-HCC) [C50.411]  PLAN: 1. Bilateral breast MRI 2. Expect Oncology appointment next week 3. Will call you with MRI results for further discussion of surgical management.   Patient and her husband verbalized understanding, all questions were answered, and were agreeable with the plan outlined above.   I spent a total of 60 minutes in both face-to-face and non-face-to-face activities for this visit on the date of this encounter.   Herbert Pun, MD  Electronically signed by Herbert Pun, MD

## 2019-07-26 NOTE — H&P (Signed)
PATIENT PROFILE: Connie Bradford is a 59 y.o. female who presents to the Clinic for consultation at the request of Dr. Kenton Bradford for evaluation of breast cancer.  PCP:  Connie Sons, MD  HISTORY OF PRESENT ILLNESS: Connie Bradford reports she had her usual screening mammogram.  Screening mammogram was fully conclusive when she was called for diagnostic mammogram.  She had diagnostic mammogram of the right breast.  The diagnostic mammogram shows 2 set of calcifications on the right lateral aspect.  No masses or lesions seen on ultrasound.  I personally evaluated the images patient had stereotactic core biopsy.  The 2 sets of microcalcifications shows: 1.  Invasive mammary carcinoma and 2.  Ductal carcinoma in situ, high-grade.  Patient denies any palpable mass, skin changes, nipple retraction or discharge.  Family history of breast cancer: None Family history of other cancers: Bladder (mother) Menarche: 71 year old Menopause: 59 years old Used OCP: For more than 10 years Used estrogen and progesterone therapy: None History of Radiation to the chest: None Previous breast biopsy: None Number of pregnancies: 0  PROBLEM LIST:         Problem List  Date Reviewed: 12/05/2018         Noted   Graves' disease 05/17/2014      GENERAL REVIEW OF SYSTEMS:   General ROS: negative for - chills, fatigue, fever, weight gain or weight loss Allergy and Immunology ROS: negative for - hives  Hematological and Lymphatic ROS: negative for - bleeding problems or bruising, negative for palpable nodes Endocrine ROS: negative for - heat or cold intolerance, hair changes Respiratory ROS: negative for - cough, shortness of breath or wheezing Cardiovascular ROS: no chest pain or palpitations GI ROS: negative for nausea, vomiting, abdominal pain, diarrhea, constipation Musculoskeletal ROS: negative for - joint swelling or muscle pain Neurological ROS: negative for - confusion, syncope Dermatological ROS:  negative for pruritus and rash Psychiatric: negative for anxiety, depression, difficulty sleeping and memory loss  MEDICATIONS: Current Medications        Current Outpatient Medications  Medication Sig Dispense Refill  . cholecalciferol (CHOLECALCIFEROL) 1,000 unit tablet Take 5,000 Units by mouth once daily.    . methIMAzole (TAPAZOLE) 5 MG tablet Take 1 tablet (5 mg total) by mouth once daily 90 tablet 2  . multivitamin tablet Take 1 tablet by mouth once daily.    . OM-3/E/LINOL/ALA/OLEIC/GLA/LIP (OMEGA 3-6-9 ORAL) Take 1 capsule by mouth once daily.     Marland Kitchen VITAMIN B COMPLEX ORAL Take 1 tablet by mouth once daily     No current facility-administered medications for this visit.       ALLERGIES: Penicillins  PAST MEDICAL HISTORY:     Past Medical History:  Diagnosis Date  . Goiter   . Graves' disease onset 08/2010  . History of cancer 07/05/19  . Hyperlipidemia   . Hyperthyroidism, unspecified    likely due to Graves' disease  . Prediabetes     PAST SURGICAL HISTORY:      Past Surgical History:  Procedure Laterality Date  . APPENDECTOMY  09/16/1980  . COLONOSCOPY  02/19/2013   Hyperplastic polyps - Repeat 10 years  . Neck surgery  2013   Had lift of skin from weight loss.  . Ovarian cyst removed  1982  . TONSILLECTOMY  1974     FAMILY HISTORY:      Family History  Problem Relation Age of Onset  . Cancer Mother        bladder  . Diabetes  Father   . Diabetes Brother   . Diabetes Brother      SOCIAL HISTORY: Social History          Socioeconomic History  . Marital status: Married    Spouse name: Not on file  . Number of children: Not on file  . Years of education: Not on file  . Highest education level: Not on file  Occupational History  . Not on file  Social Needs  . Financial resource strain: Not on file  . Food insecurity    Worry: Not on file    Inability: Not on file  . Transportation needs     Medical: Not on file    Non-medical: Not on file  Tobacco Use  . Smoking status: Former Smoker    Packs/day: 1.00    Years: 25.00    Pack years: 25.00    Types: Cigarettes    Start date: 09/16/1980    Quit date: 09/16/2005    Years since quitting: 13.8  . Smokeless tobacco: Never Used  . Tobacco comment: Quit smoking 2007  Substance and Sexual Activity  . Alcohol use: No    Alcohol/week: 0.0 standard drinks  . Drug use: No  . Sexual activity: Not Currently    Partners: Male    Birth control/protection: None  Other Topics Concern  . Not on file  Social History Narrative  . Not on file      PHYSICAL EXAM:    Vitals:   07/10/19 1455  BP: 156/77  Pulse: 73   Body mass index is 29.13 kg/m. Weight: 84.4 kg (186 lb)   GENERAL: Alert, active, oriented x3  HEENT: Pupils equal reactive to light. Extraocular movements are intact. Sclera clear. Palpebral conjunctiva normal red color.Pharynx clear.  NECK: Supple with no palpable mass and no adenopathy.  LUNGS: Sound clear with no rales rhonchi or wheezes.  HEART: Regular rhythm S1 and S2 without murmur.  BREAST: breasts appear normal, no suspicious masses, no skin or nipple changes or axillary nodes.  ABDOMEN: Soft and depressible, nontender with no palpable mass, no hepatomegaly.  EXTREMITIES: Well-developed well-nourished symmetrical with no dependent edema.  NEUROLOGICAL: Awake alert oriented, facial expression symmetrical, moving all extremities.  REVIEW OF DATA: I have reviewed the following data today:      No visits with results within 3 Month(s) from this visit.  Latest known visit with results is:  Appointment on 04/01/2019  Component Date Value  . Thyroid Stimulating Horm* 04/01/2019 2.067   . Thyroxine, Free (FT4) 04/01/2019 0.71      ASSESSMENT: Connie Bradford is a 59 y.o. female presenting for consultation for wrist cancer.    Patient was oriented again about the  pathology results. Surgical alternatives were discussed with patient including partial vs total mastectomy. Surgical technique and post operative care was discussed with patient. Risk of surgery was discussed with patient including but not limited to: wound infection, seroma, hematoma, brachial plexopathy, mondor's disease (thrombosis of Connie Bradford veins of breast), chronic wound pain, breast lymphedema, altered sensation to the nipple and cosmesis among others.   Due to patient's 2 areas with cancer confirm including a large area with DCIS, MRI of the breast was recommended by radiologist.  Will order MRI.  The receptors of the cancers has come out yet.  Patient will see medical oncologist next week.  After the MRI results and the receptor results and complete discussion with medical oncologist will call back the patient and discuss to proceed between partial  mastectomy versus total mastectomy, and sentinel lymph node biopsy.  Malignant neoplasm of upper-outer quadrant of right female breast, unspecified estrogen receptor status (CMS-HCC) [C50.411]  PLAN: 1. Bilateral breast MRI 2. Expect Oncology appointment next week 3. Will call you with MRI results for further discussion of surgical management.   Patient and her husband verbalized understanding, all questions were answered, and were agreeable with the plan outlined above.   I spent a total of 60 minutes in both face-to-face and non-face-to-face activities for this visit on the date of this encounter.   Herbert Pun, MD  Electronically signed by Herbert Pun, MD

## 2019-07-29 ENCOUNTER — Other Ambulatory Visit
Admission: RE | Admit: 2019-07-29 | Discharge: 2019-07-29 | Disposition: A | Discharge status: Home / Self Care | Payer: 59 | Source: Ambulatory Visit | Attending: General Surgery | Admitting: General Surgery

## 2019-07-29 ENCOUNTER — Other Ambulatory Visit: Payer: Self-pay

## 2019-07-29 DIAGNOSIS — Z20822 Contact with and (suspected) exposure to covid-19: Secondary | ICD-10-CM | POA: Diagnosis not present

## 2019-07-29 DIAGNOSIS — Z01812 Encounter for preprocedural laboratory examination: Secondary | ICD-10-CM | POA: Diagnosis not present

## 2019-07-29 LAB — SARS CORONAVIRUS 2 (TAT 6-24 HRS): SARS Coronavirus 2: NEGATIVE

## 2019-07-30 MED ORDER — CLINDAMYCIN PHOSPHATE 900 MG/50ML IV SOLN
900.0000 mg | INTRAVENOUS | Status: AC
  Administered 2019-07-31: 900 mg via INTRAVENOUS

## 2019-07-31 ENCOUNTER — Ambulatory Visit
Admission: RE | Admit: 2019-07-31 | Discharge: 2019-07-31 | Disposition: A | Discharge status: Home / Self Care | Payer: 59 | Source: Ambulatory Visit | Attending: General Surgery | Admitting: General Surgery

## 2019-07-31 ENCOUNTER — Other Ambulatory Visit: Payer: Self-pay

## 2019-07-31 ENCOUNTER — Ambulatory Visit: Payer: 59 | Admitting: Certified Registered"

## 2019-07-31 ENCOUNTER — Ambulatory Visit
Admission: RE | Admit: 2019-07-31 | Discharge: 2019-07-31 | Disposition: A | Discharge status: Home / Self Care | Payer: 59 | Attending: General Surgery | Admitting: General Surgery

## 2019-07-31 ENCOUNTER — Ambulatory Visit: Payer: 59

## 2019-07-31 ENCOUNTER — Encounter
Admission: RE | Disposition: A | Discharge status: Home / Self Care | Payer: Self-pay | Source: Home / Self Care | Attending: General Surgery

## 2019-07-31 ENCOUNTER — Encounter: Payer: Self-pay | Admitting: General Surgery

## 2019-07-31 DIAGNOSIS — C50411 Malignant neoplasm of upper-outer quadrant of right female breast: Secondary | ICD-10-CM | POA: Diagnosis not present

## 2019-07-31 DIAGNOSIS — C50911 Malignant neoplasm of unspecified site of right female breast: Secondary | ICD-10-CM | POA: Diagnosis not present

## 2019-07-31 DIAGNOSIS — Z87891 Personal history of nicotine dependence: Secondary | ICD-10-CM | POA: Diagnosis not present

## 2019-07-31 DIAGNOSIS — E0500 Thyrotoxicosis with diffuse goiter without thyrotoxic crisis or storm: Secondary | ICD-10-CM | POA: Insufficient documentation

## 2019-07-31 DIAGNOSIS — E785 Hyperlipidemia, unspecified: Secondary | ICD-10-CM | POA: Diagnosis not present

## 2019-07-31 DIAGNOSIS — E559 Vitamin D deficiency, unspecified: Secondary | ICD-10-CM | POA: Diagnosis not present

## 2019-07-31 DIAGNOSIS — D0511 Intraductal carcinoma in situ of right breast: Secondary | ICD-10-CM | POA: Diagnosis not present

## 2019-07-31 DIAGNOSIS — Z95828 Presence of other vascular implants and grafts: Secondary | ICD-10-CM

## 2019-07-31 DIAGNOSIS — Z880 Allergy status to penicillin: Secondary | ICD-10-CM | POA: Insufficient documentation

## 2019-07-31 DIAGNOSIS — Z79899 Other long term (current) drug therapy: Secondary | ICD-10-CM | POA: Insufficient documentation

## 2019-07-31 DIAGNOSIS — Z452 Encounter for adjustment and management of vascular access device: Secondary | ICD-10-CM | POA: Diagnosis not present

## 2019-07-31 HISTORY — PX: PORTACATH PLACEMENT: SHX2246

## 2019-07-31 HISTORY — PX: PARTIAL MASTECTOMY WITH NEEDLE LOCALIZATION AND AXILLARY SENTINEL LYMPH NODE BX: SHX6009

## 2019-07-31 HISTORY — PX: BREAST LUMPECTOMY: SHX2

## 2019-07-31 SURGERY — PARTIAL MASTECTOMY WITH NEEDLE LOCALIZATION AND AXILLARY SENTINEL LYMPH NODE BX
Anesthesia: General | Site: Chest | Laterality: Right

## 2019-07-31 MED ORDER — SODIUM CHLORIDE FLUSH 0.9 % IV SOLN
INTRAVENOUS | Status: AC
  Filled 2019-07-31: qty 10

## 2019-07-31 MED ORDER — FENTANYL CITRATE (PF) 100 MCG/2ML IJ SOLN
INTRAMUSCULAR | Status: AC
  Filled 2019-07-31: qty 2

## 2019-07-31 MED ORDER — OXYCODONE HCL 5 MG/5ML PO SOLN: 5.0000 mg | Freq: Once | ORAL | Status: AC | PRN

## 2019-07-31 MED ORDER — SODIUM CHLORIDE (PF) 0.9 % IJ SOLN
INTRAMUSCULAR | Status: AC
  Filled 2019-07-31: qty 50

## 2019-07-31 MED ORDER — SODIUM CHLORIDE 0.9 % IV SOLN
INTRAVENOUS | Status: DC | PRN
  Administered 2019-07-31: 10 ug/min via INTRAVENOUS

## 2019-07-31 MED ORDER — BUPIVACAINE-EPINEPHRINE (PF) 0.25% -1:200000 IJ SOLN
INTRAMUSCULAR | Status: DC | PRN
  Administered 2019-07-31: 8 mL

## 2019-07-31 MED ORDER — OXYCODONE HCL 5 MG PO TABS
ORAL_TABLET | ORAL | Status: AC
  Filled 2019-07-31: qty 1

## 2019-07-31 MED ORDER — FAMOTIDINE 20 MG PO TABS
20.0000 mg | ORAL_TABLET | Freq: Once | ORAL | Status: AC
  Administered 2019-07-31: 10:00:00 20 mg via ORAL

## 2019-07-31 MED ORDER — PROPOFOL 10 MG/ML IV BOLUS
INTRAVENOUS | Status: DC | PRN
  Administered 2019-07-31: 160 mg via INTRAVENOUS

## 2019-07-31 MED ORDER — METHYLENE BLUE 0.5 % INJ SOLN
INTRAVENOUS | Status: AC
  Filled 2019-07-31: qty 10

## 2019-07-31 MED ORDER — EPINEPHRINE PF 1 MG/ML IJ SOLN
INTRAMUSCULAR | Status: AC
  Filled 2019-07-31: qty 1

## 2019-07-31 MED ORDER — ACETAMINOPHEN 10 MG/ML IV SOLN
INTRAVENOUS | Status: DC | PRN
  Administered 2019-07-31: 1000 mg via INTRAVENOUS

## 2019-07-31 MED ORDER — ONDANSETRON HCL 4 MG/2ML IJ SOLN
INTRAMUSCULAR | Status: AC
  Filled 2019-07-31: qty 2

## 2019-07-31 MED ORDER — PROMETHAZINE HCL 25 MG/ML IJ SOLN: 6.2500 mg | INTRAMUSCULAR | Status: DC | PRN

## 2019-07-31 MED ORDER — ESMOLOL HCL 100 MG/10ML IV SOLN
INTRAVENOUS | Status: AC
  Filled 2019-07-31: qty 10

## 2019-07-31 MED ORDER — MIDAZOLAM HCL 2 MG/2ML IJ SOLN
INTRAMUSCULAR | Status: AC
  Filled 2019-07-31: qty 2

## 2019-07-31 MED ORDER — METHYLENE BLUE 0.5 % INJ SOLN
INTRAVENOUS | Status: DC | PRN
  Administered 2019-07-31: 6 mL

## 2019-07-31 MED ORDER — BUPIVACAINE HCL (PF) 0.25 % IJ SOLN
INTRAMUSCULAR | Status: AC
  Filled 2019-07-31: qty 30

## 2019-07-31 MED ORDER — OXYCODONE HCL 5 MG PO TABS
5.0000 mg | ORAL_TABLET | Freq: Once | ORAL | Status: AC | PRN
  Administered 2019-07-31: 5 mg via ORAL

## 2019-07-31 MED ORDER — FENTANYL CITRATE (PF) 100 MCG/2ML IJ SOLN
25.0000 ug | INTRAMUSCULAR | Status: DC | PRN
  Administered 2019-07-31 (×2): 50 ug via INTRAVENOUS
  Administered 2019-07-31: 15:00:00 25 ug via INTRAVENOUS

## 2019-07-31 MED ORDER — ONDANSETRON HCL 4 MG/2ML IJ SOLN
INTRAMUSCULAR | Status: DC | PRN
  Administered 2019-07-31: 4 mg via INTRAVENOUS

## 2019-07-31 MED ORDER — DEXMEDETOMIDINE HCL IN NACL 200 MCG/50ML IV SOLN
INTRAVENOUS | Status: DC | PRN
  Administered 2019-07-31 (×2): 4 ug via INTRAVENOUS

## 2019-07-31 MED ORDER — DEXAMETHASONE SODIUM PHOSPHATE 10 MG/ML IJ SOLN
INTRAMUSCULAR | Status: AC
  Filled 2019-07-31: qty 1

## 2019-07-31 MED ORDER — DEXAMETHASONE SODIUM PHOSPHATE 10 MG/ML IJ SOLN
INTRAMUSCULAR | Status: DC | PRN
  Administered 2019-07-31: 10 mg via INTRAVENOUS

## 2019-07-31 MED ORDER — LIDOCAINE HCL (CARDIAC) PF 100 MG/5ML IV SOSY
PREFILLED_SYRINGE | INTRAVENOUS | Status: DC | PRN
  Administered 2019-07-31: 100 mg via INTRAVENOUS

## 2019-07-31 MED ORDER — SODIUM CHLORIDE (PF) 0.9 % IJ SOLN
INTRAMUSCULAR | Status: AC
  Filled 2019-07-31: qty 10

## 2019-07-31 MED ORDER — ACETAMINOPHEN 10 MG/ML IV SOLN
INTRAVENOUS | Status: AC
  Filled 2019-07-31: qty 100

## 2019-07-31 MED ORDER — FENTANYL CITRATE (PF) 100 MCG/2ML IJ SOLN
INTRAMUSCULAR | Status: AC
  Administered 2019-07-31: 15:00:00 25 ug via INTRAVENOUS
  Filled 2019-07-31: qty 2

## 2019-07-31 MED ORDER — GLYCOPYRROLATE 0.2 MG/ML IJ SOLN
INTRAMUSCULAR | Status: DC | PRN
  Administered 2019-07-31: .2 mg via INTRAVENOUS

## 2019-07-31 MED ORDER — TECHNETIUM TC 99M SULFUR COLLOID FILTERED
0.9400 | Freq: Once | INTRAVENOUS | Status: AC | PRN
  Administered 2019-07-31: 0.94 via INTRADERMAL

## 2019-07-31 MED ORDER — KETOROLAC TROMETHAMINE 30 MG/ML IJ SOLN
INTRAMUSCULAR | Status: AC
  Filled 2019-07-31: qty 1

## 2019-07-31 MED ORDER — HYDROCODONE-ACETAMINOPHEN 5-325 MG PO TABS: 1.0000 | ORAL_TABLET | ORAL | 0 refills | Status: AC | PRN

## 2019-07-31 MED ORDER — MEPERIDINE HCL 50 MG/ML IJ SOLN: 6.2500 mg | INTRAMUSCULAR | Status: DC | PRN

## 2019-07-31 MED ORDER — BUPIVACAINE HCL (PF) 0.5 % IJ SOLN
INTRAMUSCULAR | Status: AC
  Filled 2019-07-31: qty 30

## 2019-07-31 MED ORDER — LIDOCAINE HCL (PF) 2 % IJ SOLN
INTRAMUSCULAR | Status: AC
  Filled 2019-07-31: qty 5

## 2019-07-31 MED ORDER — MIDAZOLAM HCL 2 MG/2ML IJ SOLN
INTRAMUSCULAR | Status: DC | PRN
  Administered 2019-07-31: 2 mg via INTRAVENOUS

## 2019-07-31 MED ORDER — SODIUM CHLORIDE 0.9 % IV SOLN
INTRAVENOUS | Status: DC | PRN
  Administered 2019-07-31: 10 mL via INTRAMUSCULAR

## 2019-07-31 MED ORDER — FENTANYL CITRATE (PF) 100 MCG/2ML IJ SOLN
INTRAMUSCULAR | Status: DC | PRN
  Administered 2019-07-31 (×5): 25 ug via INTRAVENOUS

## 2019-07-31 MED ORDER — HEPARIN SODIUM (PORCINE) 5000 UNIT/ML IJ SOLN
INTRAMUSCULAR | Status: AC
  Filled 2019-07-31: qty 1

## 2019-07-31 MED ORDER — LACTATED RINGERS IV SOLN: INTRAVENOUS | Status: DC

## 2019-07-31 MED ORDER — BUPIVACAINE-EPINEPHRINE (PF) 0.5% -1:200000 IJ SOLN
INTRAMUSCULAR | Status: DC | PRN
  Administered 2019-07-31: 30 mL

## 2019-07-31 SURGICAL SUPPLY — 63 items
BINDER BREAST LRG (GAUZE/BANDAGES/DRESSINGS) IMPLANT
BINDER BREAST MEDIUM (GAUZE/BANDAGES/DRESSINGS) IMPLANT
BINDER BREAST XLRG (GAUZE/BANDAGES/DRESSINGS) IMPLANT
BINDER BREAST XXLRG (GAUZE/BANDAGES/DRESSINGS) IMPLANT
BLADE SURG 15 STRL LF DISP TIS (BLADE) ×4 IMPLANT
BLADE SURG 15 STRL SS (BLADE) ×4
BLADE SURG SZ11 CARB STEEL (BLADE) ×4 IMPLANT
BOOT SUTURE AID YELLOW STND (SUTURE) ×4 IMPLANT
CANISTER SUCT 1200ML W/VALVE (MISCELLANEOUS) ×4 IMPLANT
CHLORAPREP W/TINT 26 (MISCELLANEOUS) ×8 IMPLANT
CNTNR SPEC 2.5X3XGRAD LEK (MISCELLANEOUS) ×2
CONT SPEC 4OZ STER OR WHT (MISCELLANEOUS) ×2
CONTAINER SPEC 2.5X3XGRAD LEK (MISCELLANEOUS) ×2 IMPLANT
COVER LIGHT HANDLE STERIS (MISCELLANEOUS) ×8 IMPLANT
COVER WAND RF STERILE (DRAPES) ×4 IMPLANT
DERMABOND ADVANCED (GAUZE/BANDAGES/DRESSINGS) ×4
DERMABOND ADVANCED .7 DNX12 (GAUZE/BANDAGES/DRESSINGS) ×4 IMPLANT
DEVICE DUBIN SPECIMEN MAMMOGRA (MISCELLANEOUS) ×4 IMPLANT
DRAPE C-ARM XRAY 36X54 (DRAPES) ×4 IMPLANT
DRAPE LAPAROTOMY TRNSV 106X77 (MISCELLANEOUS) ×4 IMPLANT
DRSG GAUZE FLUFF 36X18 (GAUZE/BANDAGES/DRESSINGS) IMPLANT
ELECT CAUTERY BLADE 6.4 (BLADE) ×4 IMPLANT
ELECT REM PT RETURN 9FT ADLT (ELECTROSURGICAL) ×4
ELECTRODE REM PT RTRN 9FT ADLT (ELECTROSURGICAL) ×2 IMPLANT
GLOVE BIO SURGEON STRL SZ 6.5 (GLOVE) ×3 IMPLANT
GLOVE BIO SURGEONS STRL SZ 6.5 (GLOVE) ×1
GLOVE BIOGEL PI IND STRL 6.5 (GLOVE) ×2 IMPLANT
GLOVE BIOGEL PI INDICATOR 6.5 (GLOVE) ×2
GOWN STRL REUS W/ TWL LRG LVL3 (GOWN DISPOSABLE) ×6 IMPLANT
GOWN STRL REUS W/TWL LRG LVL3 (GOWN DISPOSABLE) ×6
KIT MARKER MARGIN INK (KITS) ×4 IMPLANT
KIT PORT POWER 8FR ISP CVUE (Port) ×4 IMPLANT
KIT TURNOVER KIT A (KITS) ×4 IMPLANT
LABEL OR SOLS (LABEL) ×4 IMPLANT
MARGIN MAP 10MM (MISCELLANEOUS) IMPLANT
MARKER MARGIN CORRECT CLIP (MARKER) ×4 IMPLANT
NEEDLE FILTER BLUNT 18X 1/2SAF (NEEDLE) ×4
NEEDLE FILTER BLUNT 18X1 1/2 (NEEDLE) ×4 IMPLANT
NEEDLE HYPO 22GX1.5 SAFETY (NEEDLE) ×8 IMPLANT
NEEDLE HYPO 25X1 1.5 SAFETY (NEEDLE) IMPLANT
PACK BASIN MINOR (MISCELLANEOUS) ×4 IMPLANT
PACK PORT-A-CATH (MISCELLANEOUS) ×4 IMPLANT
RETRACTOR RING XSMALL (MISCELLANEOUS) ×2 IMPLANT
RTRCTR WOUND ALEXIS 13CM XS SH (MISCELLANEOUS) ×4
SLEVE PROBE SENORX GAMMA FIND (MISCELLANEOUS) ×4 IMPLANT
SUT ETHILON 3-0 FS-10 30 BLK (SUTURE)
SUT MNCRL 3-0 UNDYED SH (SUTURE) ×2 IMPLANT
SUT MNCRL 4-0 (SUTURE)
SUT MNCRL 4-0 27XMFL (SUTURE)
SUT MNCRL AB 4-0 PS2 18 (SUTURE) ×8 IMPLANT
SUT MONOCRYL 3-0 UNDYED (SUTURE) ×2
SUT PROLENE 2 0 FS (SUTURE) ×8 IMPLANT
SUT SILK 2 0 SH (SUTURE) ×4 IMPLANT
SUT VIC AB 2-0 SH 27 (SUTURE) ×2
SUT VIC AB 2-0 SH 27XBRD (SUTURE) ×2 IMPLANT
SUT VIC AB 3-0 SH 27 (SUTURE) ×6
SUT VIC AB 3-0 SH 27X BRD (SUTURE) ×6 IMPLANT
SUTURE EHLN 3-0 FS-10 30 BLK (SUTURE) IMPLANT
SUTURE MNCRL 4-0 27XMF (SUTURE) IMPLANT
SYR 10ML LL (SYRINGE) ×8 IMPLANT
SYR 3ML LL SCALE MARK (SYRINGE) ×4 IMPLANT
SYR BULB IRRIG 60ML STRL (SYRINGE) ×4 IMPLANT
WATER STERILE IRR 1000ML POUR (IV SOLUTION) ×4 IMPLANT

## 2019-07-31 NOTE — Interval H&P Note (Signed)
History and Physical Interval Note:  07/31/2019 11:26 AM  Connie Bradford  has presented today for surgery, with the diagnosis of C50.411 Malignant neoplasm of upper outer quadrant of rt female breast, unspecified estrogen receptor.  The various methods of treatment have been discussed with the patient and family. After consideration of risks, benefits and other options for treatment, the patient has consented to  Procedure(s): PARTIAL MASTECTOMY WITH NEEDLE LOCALIZATION AND AXILLARY SENTINEL LYMPH NODE BX (Right) INSERTION PORT-A-CATH (Left) as a surgical intervention.  The patient's history has been reviewed, patient examined, no change in status, stable for surgery.  I have reviewed the patient's chart and labs.  Right breast marked in the pre procedure room. Questions were answered to the patient's satisfaction.     Herbert Pun

## 2019-07-31 NOTE — Discharge Instructions (Signed)
  Diet: Resume home heart healthy regular diet.   Activity: No heavy lifting >20 pounds (children, pets, laundry, garbage) or strenuous activity until follow-up, but light activity and walking are encouraged. Do not drive or drink alcohol if taking narcotic pain medications.  Wound care: May shower with soapy water and pat dry (do not rub incisions), but no baths or submerging incision underwater until follow-up. (no swimming)   Medications: Resume all home medications. For mild to moderate pain: acetaminophen (Tylenol) or ibuprofen (if no kidney disease). Combining Tylenol with alcohol can substantially increase your risk of causing liver disease. Narcotic pain medications, if prescribed, can be used for severe pain, though may cause nausea, constipation, and drowsiness. Do not combine Tylenol and Norco within a 6 hour period as Norco contains Tylenol. If you do not need the narcotic pain medication, you do not need to fill the prescription.  Call office ([phone]) at any time if any questions, worsening pain, fevers/chills, bleeding, drainage from incision site, or other concerns.   AMBULATORY SURGERY  DISCHARGE INSTRUCTIONS   1) The drugs that you were given will stay in your system until tomorrow so for the next 24 hours you should not:  A) Drive an automobile B) Make any legal decisions C) Drink any alcoholic beverage   2) You may resume regular meals tomorrow.  Today it is better to start with liquids and gradually work up to solid foods.  You may eat anything you prefer, but it is better to start with liquids, then soup and crackers, and gradually work up to solid foods.   3) Please notify your doctor immediately if you have any unusual bleeding, trouble breathing, redness and pain at the surgery site, drainage, fever, or pain not relieved by medication.    4) Additional Instructions:        Please contact your physician with any problems or Same Day Surgery at  [phone], Monday through Friday 6 am to 4 pm, or [place] at  Main number at [phone]. 

## 2019-07-31 NOTE — Anesthesia Procedure Notes (Signed)
Procedure Name: LMA Insertion Performed by: Kelton Pillar, CRNA Pre-anesthesia Checklist: Patient identified, Emergency Drugs available, Suction available and Patient being monitored Patient Re-evaluated:Patient Re-evaluated prior to induction Oxygen Delivery Method: Circle system utilized Preoxygenation: Pre-oxygenation with 100% oxygen Induction Type: IV induction Ventilation: Mask ventilation without difficulty LMA: LMA inserted LMA Size: 4.5 Number of attempts: 1 Placement Confirmation: positive ETCO2,  CO2 detector and breath sounds checked- equal and bilateral Tube secured with: Tape Dental Injury: Teeth and Oropharynx as per pre-operative assessment

## 2019-07-31 NOTE — Transfer of Care (Signed)
Immediate Anesthesia Transfer of Care Note  Patient: Connie Bradford  Procedure(s) Performed: RIGHT PARTIAL MASTECTOMY WITH BRACKETED NEEDLE LOCALIZATION AND LEFT AXILLARY SENTINEL LYMPH NODE BIOPSY (Right Breast) INSERTION PORT-A-CATH LEFT INTERNAL JUGULAR (Left Chest)  Patient Location: PACU  Anesthesia Type:General  Level of Consciousness: drowsy, patient cooperative and responds to stimulation  Airway & Oxygen Therapy: Patient Spontanous Breathing and Patient connected to face mask oxygen  Post-op Assessment: Report given to RN and Post -op Vital signs reviewed and stable  Post vital signs: Reviewed and stable  Last Vitals:  Vitals Value Taken Time  BP 131/65 07/31/19 1449  Temp 36.7 C 07/31/19 1449  Pulse 72 07/31/19 1452  Resp 12 07/31/19 1452  SpO2 99 % 07/31/19 1452  Vitals shown include unvalidated device data.  Last Pain:  Vitals:   07/31/19 1449  TempSrc:   PainSc: Asleep         Complications: No apparent anesthesia complications

## 2019-07-31 NOTE — Op Note (Signed)
SURGICAL PROCEDURE REPORT  DATE OF PROCEDURE: 07/31/2019   SURGEON: Dr. Windell Moment   ANESTHESIA: Local with light IV sedation   PRE-OPERATIVE DIAGNOSIS: Advanced breast cancer requiring durable central venous access for chemotherapy   POST-OPERATIVE DIAGNOSIS: Advanced breast cancer requiring durable central venous access for chemotherapy  PROCEDURE(S):  1.) Percutaneous access of Left internal jugular vein under ultrasound guidance 2.) Insertion of tunneled Left internal jugular central venous catheter with subcutaneous port  INTRAOPERATIVE FINDINGS: Patent easily compressible Left internal jugular vein with appropriate respiratory variations and well-secured tunneled central venous catheter with subcutaneous port at completion of the procedure  ESTIMATED BLOOD LOSS: 5 mL  SPECIMENS: None   IMPLANTS: 51F tunneled Bard PowerPort central venous catheter with subcutaneous port  DRAINS: None   COMPLICATIONS: None apparent   CONDITION AT COMPLETION: Hemodynamically stable, awake   DISPOSITION: PACU   INDICATION(S) FOR PROCEDURE:  Patient is a 59 y.o. female who presented with advanced breast cancer requiring durable central venous access for chemotherapy. All risks, benefits, and alternatives to above elective procedures were discussed with the patient, who elected to proceed, and informed consent was accordingly obtained at that time.  DETAILS OF PROCEDURE:  Patient was brought to the operative suite and appropriately identified. In Trendelenburg position, Left IJ venous access site was prepped and draped in the usual sterile fashion, and following a brief timeout, percutaneous Left IJ venous access was obtained under ultrasound guidance using Seldinger technique, by which local anesthetic was injected over the Left IJ vein, and access needle was inserted under direct ultrasound visualization into the Left IJ vein, through which soft guidewire was advanced, over which access  needle was withdrawn. Guidewire was secured, attention was directed to injection of local anesthetic along the planned tunnel site, 2-3 cm transverse Left chest incision was made and confirmed to accommodate the subcutaneous port, and flushed catheter was tunneled retrograde from the port site over the Left chest to the Left IJ access site with the attached port well-secured to the catheter and within the subcutaneous pocket. Insertion sheath was advanced over the guidewire, which was withdrawn along with the insertion sheath dilator. The catheter was introduced through the sheath and left on the [place] junction under fluoro guidance and catheter cut to desire lenght. Catheter connected to port and fixed to the pocket on two side to avoid twisting. Port was confirmed to withdraw blood and flush easily, after which concentrated heparin was instilled into the port and catheter. Dermis at the subcutaneous pocket was re-approximated using buried interrupted 3-0 Vicryl suture, and 4-0 Monocryl suture was used to re-approximate skin at the insertion and subcutaneous port sites in running subcuticular fashion for the subcutaneous port and buried interrupted fashion for the insertion site. Skin was cleaned, dried, and sterile skin glue was applied. Patient was then safely transferred to PACU for a chest x-ray. Ultrasound images are available on paper chart and Fluoroscopy guidance images are available in Epic.

## 2019-07-31 NOTE — Anesthesia Preprocedure Evaluation (Signed)
Anesthesia Evaluation  Patient identified by MRN, date of birth, ID band Patient awake    Reviewed: Allergy & Precautions, NPO status , Patient's Chart, lab work & pertinent test results  History of Anesthesia Complications Negative for: history of anesthetic complications  Airway Mallampati: II  TM Distance: >3 FB Neck ROM: Full    Dental no notable dental hx.    Pulmonary neg sleep apnea, neg COPD, former smoker,    breath sounds clear to auscultation- rhonchi (-) wheezing      Cardiovascular Exercise Tolerance: Good (-) hypertension(-) CAD, (-) Past MI, (-) Cardiac Stents and (-) CABG  Rhythm:Regular Rate:Normal - Systolic murmurs and - Diastolic murmurs    Neuro/Psych neg Seizures negative neurological ROS  negative psych ROS   GI/Hepatic negative GI ROS, Neg liver ROS,   Endo/Other  neg diabetesHyperthyroidism   Renal/GU negative Renal ROS     Musculoskeletal negative musculoskeletal ROS (+)   Abdominal (+) - obese,   Peds  Hematology negative hematology ROS (+)   Anesthesia Other Findings Past Medical History: 07/2019: Breast cancer ([place])     Comment:  IMC and DCIS No date: Dyslipidemia No date: Family history of bladder cancer No date: Graves disease No date: Hyperthyroidism No date: Inborn lipid storage disorder No date: Leiomyoma of uterus No date: Vitamin D deficiency   Reproductive/Obstetrics                             Anesthesia Physical Anesthesia Plan  ASA: II  Anesthesia Plan: General   Post-op Pain Management:    Induction: Intravenous  PONV Risk Score and Plan: 2 and Ondansetron, Dexamethasone and Midazolam  Airway Management Planned: LMA  Additional Equipment:   Intra-op Plan:   Post-operative Plan:   Informed Consent: I have reviewed the patients History and Physical, chart, labs and discussed the procedure including the risks, benefits and  alternatives for the proposed anesthesia with the patient or authorized representative who has indicated his/her understanding and acceptance.     Dental advisory given  Plan Discussed with: CRNA and Anesthesiologist  Anesthesia Plan Comments:         Anesthesia Quick Evaluation

## 2019-07-31 NOTE — Op Note (Signed)
Preoperative diagnosis: Right breast carcinoma.  Postoperative diagnosis: Right breast carcinoma.   Procedure: Right wire guided-localized partial mastectomy.                       Right Axillary Sentinel Lymph node biopsy  Anesthesia: GETA  Surgeon: Dr. Windell Moment  Wound Classification: Clean  Indications: Patient is a 59 y.o. female with a nonpalpable right breast mass noted on mammography with core biopsy demonstrating invasive mammary carcinoma requires wire guided-localized partial mastectomy for treatment with sentinel lymph node biopsy.   Findings: 1. Specimen mammography shows marker and tag on specimen 2. Pathology call refers gross examination of margins was 2 mm from posterior margin.  3. No other palpable mass or lymph node identified.   Description of procedure: Preoperative wire localization was performed by radiology. In the nuclear medicine suite, the subareolar region was injected with Tc-99 sulfur colloid. Localization studies were reviewed. The patient was taken to the operating room and placed supine on the operating table, and after general anesthesia the right chest and axilla were prepped and draped in the usual sterile fashion. Due to low count pre operatively on the right axilla, I personally injected methylene blue for intra op mapping of the axillary lymph nodes. A time-out was completed verifying correct patient, procedure, site, positioning, and implant(s) and/or special equipment prior to beginning this procedure.  By comparing the localization studies, the probable trajectory and location of the mass was visualized. A circumareolar skin incision was planned in such a way as to minimize the amount of dissection to reach the mass.  The skin incision was made. Flaps were raised. A 2-0 silk figure-of-eight stay suture was placed and used for retraction. Dissection was then taken down circumferentially around the wires, taking care to include the entire wire and  clips and a wide margin of grossly normal tissue. The specimen and entire localizing tag were removed. The specimen was oriented and sent to radiology with the localization studies. Confirmation was received that the entire target lesion had been resected but just 2 mm from posterior margin. The posterior margin was re excised.  The wound was irrigated. Hemostasis was checked. The wound was closed with interrupted sutures of 3-0 Vicryl and a subcuticular suture of Monocryl 3-0. No attempt was made to close the dead space.   A hand-held gamma probe was used to identify the location of the hottest spot in the axilla. An incision was made around the caudal axillary hairline. Dissection was carried down until subdermal facias was advanced. The probe was placed and again, the point of maximal count was found. Dissection continue until nodule was identified. The probe was placed in contact with the node. The node was excised in its entirety.  An additional hot spot was detected and the node was excised in similar fashion. No additional hot spots were identified. No clinically abnormal nodes were palpated. The procedure was terminated. Hemostasis was achieved and the wound closed in layers with deep interrupted 3-0 Vicryl and skin was closed with subcuticular suture of Monocryl 3-0.  The patient tolerated the procedure well and was taken to the postanesthesia care unit in stable condition.   Specimen: Right Breast mass                      Sentinel Lymph nodes #1, #2  Complications: None  Estimated Blood Loss: 5 mL

## 2019-07-31 NOTE — Anesthesia Postprocedure Evaluation (Signed)
Anesthesia Post Note  Patient: Connie Bradford  Procedure(s) Performed: RIGHT PARTIAL MASTECTOMY WITH BRACKETED NEEDLE LOCALIZATION AND LEFT AXILLARY SENTINEL LYMPH NODE BIOPSY (Right Breast) INSERTION PORT-A-CATH LEFT INTERNAL JUGULAR (Left Chest)  Patient location during evaluation: PACU Anesthesia Type: General Level of consciousness: awake and alert and oriented Pain management: pain level controlled Vital Signs Assessment: post-procedure vital signs reviewed and stable Respiratory status: spontaneous breathing, nonlabored ventilation and respiratory function stable Cardiovascular status: blood pressure returned to baseline and stable Postop Assessment: no signs of nausea or vomiting Anesthetic complications: no     Last Vitals:  Vitals:   07/31/19 1530 07/31/19 1534  BP:  138/86  Pulse: 90 75  Resp: 18 13  Temp:  (!) 36.3 C  SpO2: 100% 98%    Last Pain:  Vitals:   07/31/19 1519  TempSrc:   PainSc: 7                  Rosalva Neary

## 2019-08-07 LAB — SURGICAL PATHOLOGY

## 2019-08-14 ENCOUNTER — Other Ambulatory Visit: Payer: Self-pay

## 2019-08-14 ENCOUNTER — Inpatient Hospital Stay (HOSPITAL_BASED_OUTPATIENT_CLINIC_OR_DEPARTMENT_OTHER): Payer: 59 | Admitting: Internal Medicine

## 2019-08-14 VITALS — BP 130/74 | HR 76 | Temp 96.5°F | Resp 16 | Wt 180.6 lb

## 2019-08-14 DIAGNOSIS — K760 Fatty (change of) liver, not elsewhere classified: Secondary | ICD-10-CM | POA: Diagnosis not present

## 2019-08-14 DIAGNOSIS — Z170 Estrogen receptor positive status [ER+]: Secondary | ICD-10-CM | POA: Diagnosis not present

## 2019-08-14 DIAGNOSIS — R7401 Elevation of levels of liver transaminase levels: Secondary | ICD-10-CM | POA: Diagnosis not present

## 2019-08-14 DIAGNOSIS — Z5111 Encounter for antineoplastic chemotherapy: Secondary | ICD-10-CM | POA: Diagnosis not present

## 2019-08-14 DIAGNOSIS — E0590 Thyrotoxicosis, unspecified without thyrotoxic crisis or storm: Secondary | ICD-10-CM | POA: Diagnosis not present

## 2019-08-14 DIAGNOSIS — C50411 Malignant neoplasm of upper-outer quadrant of right female breast: Secondary | ICD-10-CM | POA: Diagnosis not present

## 2019-08-14 DIAGNOSIS — Z8052 Family history of malignant neoplasm of bladder: Secondary | ICD-10-CM | POA: Diagnosis not present

## 2019-08-14 DIAGNOSIS — Z87891 Personal history of nicotine dependence: Secondary | ICD-10-CM | POA: Diagnosis not present

## 2019-08-14 MED ORDER — PROCHLORPERAZINE MALEATE 10 MG PO TABS: 10.0000 mg | ORAL_TABLET | Freq: Four times a day (QID) | ORAL | 1 refills | Status: AC | PRN

## 2019-08-14 MED ORDER — ONDANSETRON HCL 8 MG PO TABS: ORAL_TABLET | ORAL | 1 refills | Status: AC

## 2019-08-14 MED ORDER — LIDOCAINE-PRILOCAINE 2.5-2.5 % EX CREA: 1.0000 "application " | TOPICAL_CREAM | CUTANEOUS | 0 refills | Status: DC | PRN

## 2019-08-14 NOTE — Assessment & Plan Note (Addendum)
#  Right breast-invasive mammary carcinoma- S/P LUMPECTOMY pT1a [36mm];pN0 [STAGE IA- ER/PRpoS; Her 2 POS]; STAGE I.   #I reviewed the staging pathology with the patient in detail.  I would recommend adjuvant chemotherapy with Taxol Herceptin.  Discussed the schedule at length.  # Discussed the potential side effects including but not limited to-increasing fatigue, nausea vomiting, diarrhea, hair loss, sores in the mouth, increase risk of infection and also neuropathy.  Discussed the potential side effects of Herceptin-including but not limited to diarrhea rash and also decreased ejection fraction.  We will get MUGA scan prior to starting.  #Also recommend radiation postlumpectomy-after patient Taxol is finished.  Patient is also candidate for adjuvant endocrine therapy post Taxol.  #Slightly elevated AST ALT x 2 times times normal limits; normal bilirubin/alkaline phosphatase-ultrasound abdomen/liver-negative for any acute process.  Positive for hepatic steatosis/fatty liver as a potential cause.  Will need to monitor closely on Taxol chemotherapy.  #Hyperthyroidism-methimazole-stable.  # Disposition: # chemo education-taxol+herceptin # MUGA scan ASAP # follow up in 2 weeks- MD; labs- cbc/cmp; weekly taxol-Herceptin- Dr.B  Cc: Dr.Cintron

## 2019-08-14 NOTE — Progress Notes (Signed)
[place] NOTE  Patient Care Team: Birdie Sons, MD as PCP - General (Family Medicine) [facility] Gyn (Gynecology) Theodore Demark, RN as Oncology Nurse Navigator Cammie Sickle, MD as Consulting Physician (Hematology and Oncology)  CHIEF COMPLAINTS/PURPOSE OF CONSULTATION: Breast cancer  #  Oncology History Overview Note  # MARCH 2021- RUOQ- 62m- [BREAST, RIGHT, LATERAL POSTERIOR  - INVASIVE MAMMARY CARCINOMA, NO SPECIAL TYPE, ASSOCIATED WITH  CALCIFICATIONS.][place] ER-> 90%; PR- 50-90%; HER 2 FISH- POSITIVE  # S/P LUMPECTOMY [Dr.Cintron] pT1a [450m;pN0 [STAGE IA- ER/PRpoS; Her 2 POS]  . BREAST, RIGHT, LATERAL MIDDLE DEPTH (RIBBON-SHAPED CLIP):  STEREOTACTIC-GUIDED CORE BIOPSY: - DUCTAL CARCINOMA IN SITU (DCIS), HIGH-GRADE, ASSOCIATED WITH  CALCIFICATIONS.   #Hypothyroidism-methimazole [Dr.Solum]  # SURVIVORSHIP:   # GENETICS:   DIAGNOSIS:   STAGE:         ;  GOALS:  CURRENT/MOST RECENT THERAPY :     Carcinoma of upper-outer quadrant of right breast in female, estrogen receptor positive ([facility] 07/15/2019 Initial Diagnosis   Carcinoma of upper-outer quadrant of right breast in female, estrogen receptor positive ([facility]  08/20/2019 -  Chemotherapy   The patient had PACLITAXEL CHEMO IV INFUSION ORDERABLE (</= 80 MG/M2), 80 mg/m2, Intravenous,  Once, 0 of 3 cycles TRASTUZUMAB-DKST CHEMO IV INFUSION, 4 mg/kg, Intravenous,  Once, 0 of 16 cycles  for chemotherapy treatment.       HISTORY OF PRESENTING ILLNESS:  Connie Bradford [age].o.  female with recently diagnosed breast cancer early stage-ER/PR positive HER-2/neu positive is here status post lumpectomy.  Post surgery uneventful.  Denies any wound healing issues or complications.    Review of Systems  Constitutional: Negative for chills, diaphoresis, fever, malaise/fatigue and weight loss.  HENT: Negative for nosebleeds and sore throat.   Eyes: Negative for double vision.  Respiratory:  Negative for cough, hemoptysis, sputum production, shortness of breath and wheezing.   Cardiovascular: Negative for chest pain, palpitations, orthopnea and leg swelling.  Gastrointestinal: Negative for abdominal pain, blood in stool, constipation, diarrhea, heartburn, melena, nausea and vomiting.  Genitourinary: Negative for dysuria, frequency and urgency.  Musculoskeletal: Negative for back pain and joint pain.  Skin: Negative.  Negative for itching and rash.  Neurological: Negative for dizziness, tingling, focal weakness, weakness and headaches.  Endo/Heme/Allergies: Does not bruise/bleed easily.  Psychiatric/Behavioral: Negative for depression. The patient is not nervous/anxious and does not have insomnia.      MEDICAL HISTORY:  Past Medical History:  Diagnosis Date  . [place] [place]   [facility] [place]  . Dyslipidemia   . Family history of bladder cancer   . Graves disease   . Hyperthyroidism   . Inborn lipid storage disorder   . Leiomyoma of uterus   . Vitamin D deficiency     SURGICAL HISTORY: Past Surgical History:  Procedure Laterality Date  . APPENDECTOMY  1982   Cyst removed from ovaries   . BREAST BIOPSY Right 07/05/2019   stereo bx, coil clip, IMC  . BREAST BIOPSY Right 07/05/2019   stereo bx, ribbon clip, DCIS   . BREAST LUMPECTOMY Right 07/31/2019    [facility]  . COLONOSCOPY WITH PROPOFOL N/A 01/20/2017   Procedure: COLONOSCOPY WITH PROPOFOL;  Surgeon: AnJonathon BellowsMD;  [facility];  Service: Gastroenterology;  Laterality: N/A;  . [place] N[place]K SURGERY  2013   LIft from weight loss  . PARTIAL MASTECTOMY WITH NEEDLE LOCALIZATION AND  AXILLARY SENTINEL LYMPH NODE BX Right 07/31/2019   Procedure: RIGHT PARTIAL MASTECTOMY WITH BRACKETED NEEDLE LOCALIZATION AND LEFT AXILLARY SENTINEL LYMPH NODE BIOPSY;  Surgeon: Herbert Pun, MD;  Location: [facility] ORS;  Service: General;  Laterality: Right;  .  PORTACATH PLACEMENT Left 07/31/2019   Procedure: INSERTION PORT-A-CATH LEFT INTERNAL JUGULAR;  Surgeon: Herbert Pun, MD;  Location: [facility] ORS;  Service: General;  Laterality: Left;  . TONSILLECTOMY  1969    SOCIAL HISTORY: Social History   Socioeconomic History  . Marital status: Married    Spouse name: Not on file  . Number of children: 0  . Years of education: Anselm Lis  . Highest education level: Not on file  Occupational History  . Occupation: Full-Time    Comment: [employer] x9  Tobacco Use  . Smoking status: Former Research scientist (life sciences)  . Smokeless tobacco: Never Used  Substance and Sexual Activity  . Alcohol use: Yes    Alcohol/week: 0.0 standard drinks    Comment: RARELY  . Drug use: No  . Sexual activity: Yes    Birth control/protection: Post-menopausal  Other Topics Concern  . Not on file  Social History Narrative   Lives close to [place]; with husband. Quit smoking in 2007 [25 years]; ocassional alcohol. 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    Social Determinants of Health   Financial Resource Strain:   . Difficulty of Paying Living Expenses:   Food Insecurity:   . Worried About Charity fundraiser in the Last Year:   . Arboriculturist in the Last Year:   Transportation Needs:   . Film/video editor (Medical):   Marland Kitchen Lack of Transportation (Non-Medical):   Physical Activity:   . Days of Exercise per Week:   . Minutes of Exercise per Session:   Stress:   . Feeling of Stress :   Social Connections:   . Frequency of Communication with Friends and Family:   . Frequency of Social Gatherings with Friends and Family:   . Attends Religious Services:   . Active Member of Clubs or Organizations:   . Attends Archivist Meetings:   Marland Kitchen Marital Status:   Intimate Partner Violence:   . Fear of Current or Ex-Partner:   . Emotionally Abused:   Marland Kitchen Physically Abused:   . Sexually Abused:     FAMILY HISTORY: Family History  Problem Relation Age of Onset  .  Bladder Cancer Mother   . Diabetes Father   . Heart disease Father   . Bladder Cancer Brother   . Breast cancer Neg Hx     ALLERGIES:  is allergic to penicillins.  MEDICATIONS:  Current Outpatient Medications  Medication Sig Dispense Refill  . B COMPLEX VITAMINS PO Take 1 tablet by mouth daily.     . Cholecalciferol (VITAMIN D) 125 MCG (5000 UT) CAPS Take 5,000 Units by mouth daily.     . COLLAGEN PO Take 1 tablet by mouth daily. Super Collagen w/Biotin    . ibuprofen (ADVIL) 200 MG tablet Take 600 mg by mouth every 8 (eight) hours as needed (for pain.).    Marland Kitchen methimazole (TAPAZOLE) 5 MG tablet Take 5 mg by mouth daily.     . Multiple Vitamin (MULTIVITAMIN WITH MINERALS) TABS tablet Take 1 tablet by mouth daily.    . Omega 3-6-9 Fatty Acids (OMEGA 3-6-9 COMPLEX) CAPS Take 1 capsule by mouth daily.    . valACYclovir (VALTREX) 500 MG tablet Take 500 mg by mouth 2 (two) times  a week.     . lidocaine-prilocaine (EMLA) cream Apply 1 application topically as needed. 30 g 0  . ondansetron (ZOFRAN) 8 MG tablet One pill every 8 hours as needed for nausea/vomitting. 40 tablet 1  . prochlorperazine (COMPAZINE) 10 MG tablet Take 1 tablet (10 mg total) by mouth every 6 (six) hours as needed for nausea or vomiting. 40 tablet 1   No current facility-administered medications for this visit.    PHYSICAL EXAMINATION: ECOG PERFORMANCE STATUS: 0 - Asymptomatic  Vitals:   08/14/19 0908  BP: 130/74  Pulse: 76  Resp: 16  Temp: (!) 96.5 F (35.8 C)  SpO2: 98%   Filed Weights   08/14/19 0908  Weight: 180 lb 9.6 oz (81.9 kg)    Physical Exam  Constitutional: She is oriented to person, place, and time and well-developed, well-nourished, and in no distress.  HENT:  Head: Normocephalic and atraumatic.  Mouth/Throat: Oropharynx is clear and moist. No oropharyngeal exudate.  Eyes: Pupils are equal, round, and reactive to light.  Cardiovascular: Normal rate and regular rhythm.  Pulmonary/Chest:  Effort normal and breath sounds normal. No respiratory distress. She has no wheezes.  Abdominal: Soft. Bowel sounds are normal. She exhibits no distension and no mass. There is no abdominal tenderness. There is no rebound and no guarding.  Musculoskeletal:        General: No tenderness or edema. Normal range of motion.     Cervical back: Normal range of motion and neck supple.  Neurological: She is alert and oriented to person, place, and time.  Skin: Skin is warm.  Psychiatric: Affect normal.   LABORATORY DATA:  I have reviewed the data as listed Lab Results  Component Value Date   WBC 7.3 07/15/2019   HGB 13.4 07/15/2019   HCT 41.7 07/15/2019   MCV 88.3 07/15/2019   PLT 270 07/15/2019   Recent Labs    07/15/19 1205  NA 137  K 4.4  CL 103  CO2 25  GLUCOSE 99  BUN 18  CREATININE 0.85  CALCIUM 9.9  GFRNONAA >60  GFRAA >60  PROT 8.1  ALBUMIN 4.7  AST 87*  ALT 106*  ALKPHOS 90  BILITOT 0.7    RADIOGRAPHIC STUDIES: I have personally reviewed the radiological images as listed and agreed with the findings in the report. US Abdomen Complete  Result Date: 07/22/2019 CLINICAL DATA:  Elevated LFTs.  New diagnosis of breast cancer. EXAM: ABDOMEN ULTRASOUND COMPLETE COMPARISON:  None. FINDINGS: Gallbladder: No gallstones or wall thickening visualized. No sonographic Murphy sign noted by sonographer. Common bile duct: Diameter: 5 mm Liver: No focal lesion identified. Increased parenchymal echogenicity consistent with hepatic steatosis. Portal vein is patent on color Doppler imaging with normal direction of blood flow towards the liver. IVC: No abnormality visualized. Pancreas: Visualized portion unremarkable. Spleen: Size and appearance within normal limits. Right Kidney: Length: 10.0. Echogenicity within normal limits. No mass or hydronephrosis visualized. Left Kidney: Length: 11.6. Echogenicity within normal limits. No mass or hydronephrosis visualized. Abdominal aorta: No aneurysm  visualized. Other findings: None. IMPRESSION: 1. No acute abnormality. 2. Echogenic liver suggestive of hepatic steatosis. Electronically Signed   By: Kerby Moors M.D.   On: 07/22/2019 09:25   MR BREAST BILATERAL W WO CONTRAST INC CAD  Result Date: 07/23/2019 CLINICAL DATA:  59 year old female with newly diagnosed right breast cancer. LABS:  None performed on site. EXAM: BILATERAL BREAST MRI WITH AND WITHOUT CONTRAST TECHNIQUE: Multiplanar, multisequence MR images of both breasts were obtained prior  to and following the intravenous administration of 8 ml of Gadavist. Three-dimensional MR images were rendered by post-processing of the original MR data on an independent workstation. The three-dimensional MR images were interpreted, and findings are reported in the following complete MRI report for this study. Three dimensional images were evaluated at the independent DynaCad workstation COMPARISON:  Previous exam(s). FINDINGS: Breast composition: b. Scattered fibroglandular tissue. Background parenchymal enhancement: Mild to moderate. Note is made of multiple 2-3 mm enhancing foci scattered throughout the bilateral breasts. Right breast: Susceptibility artifact is seen in association with two post biopsy clips within the lateral right breast at middle and posterior depth (series 6, image 41/112). These are consistent with the patient's biopsy-proven sites of malignancy. Subtle non-mass enhancement is seen surrounding the post biopsy clips spanning approximately 3.8 x 3.0 cm (AP by transverse dimension). The non-mass enhancement does not extend significantly beyond the post biopsy clips. No additional suspicious mass or non-mass enhancement is seen within the remainder of the right breast. Left breast: No suspicious mass or non-mass enhancement. Lymph nodes: No abnormal appearing lymph nodes. Ancillary findings:  None. IMPRESSION: 1. Post biopsy changes in the lateral right breast at the site of the patient's  known disease. No additional suspicious findings in the remainder of the right breast. 2. No suspicious MRI findings on the left. 3. No suspicious lymphadenopathy. RECOMMENDATION: Per clinical treatment plan. BI-RADS CATEGORY  6: Known biopsy-proven malignancy. Electronically Signed   By: Kristopher Oppenheim M.D.   On: 07/23/2019 08:42   NM SENTINEL NODE INJECTION  Result Date: 07/31/2019 CLINICAL DATA:  Right breast cancer. EXAM: NUCLEAR MEDICINE BREAST LYMPHOSCINTIGRAPHY TECHNIQUE: Intradermal injection of radiopharmaceutical was performed at the 12 o'clock, 3 o'clock, 6 o'clock, and 9 o'clock positions around the left/right nipple. The patient was then sent to the operating room where the sentinel node(s) were identified and removed by the surgeon. RADIOPHARMACEUTICALS:  Total of 1 mCi Millipore-filtered Technetium-30msulfur colloid, injected in four aliquots of 0.25 mCi each. IMPRESSION: Uncomplicated intradermal injection of a total of 1 mCi Technetium-920mulfur colloid for purposes of sentinel node identification. Electronically Signed   By: HeKathreen Devoid On: 07/31/2019 10:06   MM Breast Surgical Specimen  Result Date: 07/31/2019 CLINICAL DATA:  Post right breast lumpectomy. EXAM: SPECIMEN RADIOGRAPH OF THE RIGHT BREAST COMPARISON:  Previous exam(s). FINDINGS: Status post excision of the right breast. Both localization wires and the coil and ribbon shaped biopsy marker clips are present and are marked for pathology. IMPRESSION: Specimen radiograph of the right breast. Electronically Signed   By: JeEverlean Alstrom.D.   On: 07/31/2019 14:28   DG Chest Port 1 View  Result Date: 07/31/2019 CLINICAL DATA:  Status post Port-A-Cath placement. EXAM: PORTABLE CHEST 1 VIEW COMPARISON:  None. FINDINGS: The heart size and mediastinal contours are within normal limits. Both lungs are clear. Left internal jugular Port-A-Cath is noted with tip in expected position of the SVC. No pneumothorax or pleural effusion is  noted. The visualized skeletal structures are unremarkable. IMPRESSION: Status post left internal jugular Port-A-Cath placement. No pneumothorax is noted. Electronically Signed   By: JaMarijo Conception.D.   On: 07/31/2019 15:42   DG C-Arm 1-60 Min-No Report  Result Date: 07/31/2019 Fluoroscopy was utilized by the requesting physician.  No radiographic interpretation.   MM RT PLC BREAST LOC DEV   1ST LESION  INC MAMMO GUIDE  Result Date: 07/31/2019 CLINICAL DATA:  89ear old female with recently diagnosed invasive mammary carcinoma in the  lateral posterior right breast at site of coil shaped biopsy marking clip and high-grade ductal carcinoma in situ in the lateral mid right breast at site of ribbon shaped biopsy marking clip. Patient presents for wire bracketing prior to planned lumpectomy. EXAM: NEEDLE LOCALIZATION OF THE RIGHT BREAST WITH MAMMO GUIDANCE COMPARISON:  Previous exams. FINDINGS: Patient presents for needle localization prior to right breast lumpectomy. I met with the patient and we discussed the procedure of needle localization including benefits and alternatives. We discussed the high likelihood of a successful procedure. We discussed the risks of the procedure, including infection, bleeding, tissue injury, and further surgery. Informed, written consent was given. The usual time-out protocol was performed immediately prior to the procedure. Site 1: Using mammographic guidance, sterile technique, 1% lidocaine and a 7 cm modified Kopans needle, the coil shaped biopsy marking clip in the lateral posterior right breast was localized using lateral to medial approach. The images were marked for Dr. Windell Moment. Site 2: Using mammographic guidance, sterile technique, 1% lidocaine and a 5 cm modified Kopans needle, the ribbon shaped biopsy marking clip in the lateral mid right breastwas localized using lateral to medial approach. The images were marked for Dr. Windell Moment. IMPRESSION: Needle  localization right breast. No apparent complications. Electronically Signed   By: Everlean Alstrom M.D.   On: 07/31/2019 08:24   MM RT PLC BREAST LOC DEV   EA ADD LESION  INC MAMMO GUIDE  Result Date: 07/31/2019 CLINICAL DATA:  59 year old female with recently diagnosed invasive mammary carcinoma in the lateral posterior right breast at site of coil shaped biopsy marking clip and high-grade ductal carcinoma in situ in the lateral mid right breast at site of ribbon shaped biopsy marking clip. Patient presents for wire bracketing prior to planned lumpectomy. EXAM: NEEDLE LOCALIZATION OF THE RIGHT BREAST WITH MAMMO GUIDANCE COMPARISON:  Previous exams. FINDINGS: Patient presents for needle localization prior to right breast lumpectomy. I met with the patient and we discussed the procedure of needle localization including benefits and alternatives. We discussed the high likelihood of a successful procedure. We discussed the risks of the procedure, including infection, bleeding, tissue injury, and further surgery. Informed, written consent was given. The usual time-out protocol was performed immediately prior to the procedure. Site 1: Using mammographic guidance, sterile technique, 1% lidocaine and a 7 cm modified Kopans needle, the coil shaped biopsy marking clip in the lateral posterior right breast was localized using lateral to medial approach. The images were marked for Dr. Windell Moment. Site 2: Using mammographic guidance, sterile technique, 1% lidocaine and a 5 cm modified Kopans needle, the ribbon shaped biopsy marking clip in the lateral mid right breastwas localized using lateral to medial approach. The images were marked for Dr. Windell Moment. IMPRESSION: Needle localization right breast. No apparent complications. Electronically Signed   By: Everlean Alstrom M.D.   On: 07/31/2019 08:24   ASSESSMENT & PLAN:   Carcinoma of upper-outer quadrant of right breast in female, estrogen receptor positive  ([place]) #Right breast-invasive mammary carcinoma- S/P LUMPECTOMY pT1a [71m];pN0 [STAGE IA- ER/PRpoS; Her 2 POS]; STAGE I.   #I reviewed the staging pathology with the patient in detail.  I would recommend adjuvant chemotherapy with Taxol Herceptin.  Discussed the schedule at length.  # Discussed the potential side effects including but not limited to-increasing fatigue, nausea vomiting, diarrhea, hair loss, sores in the mouth, increase risk of infection and also neuropathy.  Discussed the potential side effects of Herceptin-including but not limited to diarrhea rash and also  decreased ejection fraction.  We will get MUGA scan prior to starting.  #Also recommend radiation postlumpectomy-after patient Taxol is finished.  Patient is also candidate for adjuvant endocrine therapy post Taxol.  #Slightly elevated AST ALT x 2 times times normal limits; normal bilirubin/alkaline phosphatase-ultrasound abdomen/liver-negative for any acute process.  Positive for hepatic steatosis/fatty liver as a potential cause.  Will need to monitor closely on Taxol chemotherapy.  #Hyperthyroidism-methimazole-stable.  # Disposition: # chemo education-taxol+herceptin # MUGA scan ASAP # follow up in 2 weeks- MD; labs- cbc/cmp; weekly taxol-Herceptin- Dr.B  Cc: Dr.Cintron  All questions were answered. The patient/family knows to call the clinic with any problems, questions or concerns.    Cammie Sickle, MD 08/20/2019 8:46 AM

## 2019-08-14 NOTE — Progress Notes (Signed)
Patient will begin chemotherapy on 08/28/19.  Phoned after she saw Dr. Rogue Bussing today to assess navigation needs.

## 2019-08-14 NOTE — Progress Notes (Signed)
Pt in for follow up, reports saw surgeon yesterday.

## 2019-08-15 NOTE — Patient Instructions (Signed)
Trastuzumab injection for infusion What is this medicine? TRASTUZUMAB (tras TOO zoo mab) is a monoclonal antibody. It is used to treat breast cancer and stomach cancer. This medicine may be used for other purposes; ask your health care provider or pharmacist if you have questions. COMMON BRAND NAME(S): Herceptin, Herzuma, KANJINTI, Ogivri, Ontruzant, Trazimera What should I tell my health care provider before I take this medicine? They need to know if you have any of these conditions:  heart disease  heart failure  lung or breathing disease, like asthma  an unusual or allergic reaction to trastuzumab, benzyl alcohol, or other medications, foods, dyes, or preservatives  pregnant or trying to get pregnant  breast-feeding How should I use this medicine? This drug is given as an infusion into a vein. It is administered in a hospital or clinic by a specially trained health care professional. Talk to your pediatrician regarding the use of this medicine in children. This medicine is not approved for use in children. Overdosage: If you think you have taken too much of this medicine contact a poison control center or emergency room at once. NOTE: This medicine is only for you. Do not share this medicine with others. What if I miss a dose? It is important not to miss a dose. Call your doctor or health care professional if you are unable to keep an appointment. What may interact with this medicine? This medicine may interact with the following medications:  certain types of chemotherapy, such as daunorubicin, doxorubicin, epirubicin, and idarubicin This list may not describe all possible interactions. Give your health care provider a list of all the medicines, herbs, non-prescription drugs, or dietary supplements you use. Also tell them if you smoke, drink alcohol, or use illegal drugs. Some items may interact with your medicine. What should I watch for while using this medicine? Visit your  doctor for checks on your progress. Report any side effects. Continue your course of treatment even though you feel ill unless your doctor tells you to stop. Call your doctor or health care professional for advice if you get a fever, chills or sore throat, or other symptoms of a cold or flu. Do not treat yourself. Try to avoid being around people who are sick. You may experience fever, chills and shaking during your first infusion. These effects are usually mild and can be treated with other medicines. Report any side effects during the infusion to your health care professional. Fever and chills usually do not happen with later infusions. Do not become pregnant while taking this medicine or for 7 months after stopping it. Women should inform their doctor if they wish to become pregnant or think they might be pregnant. Women of child-bearing potential will need to have a negative pregnancy test before starting this medicine. There is a potential for serious side effects to an unborn child. Talk to your health care professional or pharmacist for more information. Do not breast-feed an infant while taking this medicine or for 7 months after stopping it. Women must use effective birth control with this medicine. What side effects may I notice from receiving this medicine? Side effects that you should report to your doctor or health care professional as soon as possible:  allergic reactions like skin rash, itching or hives, swelling of the face, lips, or tongue  chest pain or palpitations  cough  dizziness  feeling faint or lightheaded, falls  fever  general ill feeling or flu-like symptoms  signs of worsening heart failure like   breathing problems; swelling in your legs and feet  unusually weak or tired Side effects that usually do not require medical attention (report to your doctor or health care professional if they continue or are bothersome):  bone pain  changes in  taste  diarrhea  joint pain  nausea/vomiting  weight loss This list may not describe all possible side effects. Call your doctor for medical advice about side effects. You may report side effects to FDA at 1-800-FDA-1088. Where should I keep my medicine? This drug is given in a hospital or clinic and will not be stored at home. NOTE: This sheet is a summary. It may not cover all possible information. If you have questions about this medicine, talk to your doctor, pharmacist, or health care provider.  2020 [employer]/Gold Standard (2016-03-29 14:37:52) Paclitaxel injection What is this medicine? PACLITAXEL (PAK li TAX el) is a chemotherapy drug. It targets fast dividing cells, like cancer cells, and causes these cells to die. This medicine is used to treat ovarian cancer, breast cancer, lung cancer, Kaposi's sarcoma, and other cancers. This medicine may be used for other purposes; ask your health care provider or pharmacist if you have questions. COMMON BRAND NAME(S): Onxol, Taxol What should I tell my health care provider before I take this medicine? They need to know if you have any of these conditions:  history of irregular heartbeat  liver disease  low blood counts, like low white cell, platelet, or red cell counts  lung or breathing disease, like asthma  tingling of the fingers or toes, or other nerve disorder  an unusual or allergic reaction to paclitaxel, alcohol, polyoxyethylated castor oil, other chemotherapy, other medicines, foods, dyes, or preservatives  pregnant or trying to get pregnant  breast-feeding How should I use this medicine? This drug is given as an infusion into a vein. It is administered in a hospital or clinic by a specially trained health care professional. Talk to your pediatrician regarding the use of this medicine in children. Special care may be needed. Overdosage: If you think you have taken too much of this medicine contact a poison control  center or emergency room at once. NOTE: This medicine is only for you. Do not share this medicine with others. What if I miss a dose? It is important not to miss your dose. Call your doctor or health care professional if you are unable to keep an appointment. What may interact with this medicine? Do not take this medicine with any of the following medications:  disulfiram  metronidazole This medicine may also interact with the following medications:  antiviral medicines for hepatitis, HIV or AIDS  certain antibiotics like erythromycin and clarithromycin  certain medicines for fungal infections like ketoconazole and itraconazole  certain medicines for seizures like carbamazepine, phenobarbital, phenytoin  gemfibrozil  nefazodone  rifampin  St. John's wort This list may not describe all possible interactions. Give your health care provider a list of all the medicines, herbs, non-prescription drugs, or dietary supplements you use. Also tell them if you smoke, drink alcohol, or use illegal drugs. Some items may interact with your medicine. What should I watch for while using this medicine? Your condition will be monitored carefully while you are receiving this medicine. You will need important blood work done while you are taking this medicine. This medicine can cause serious allergic reactions. To reduce your risk you will need to take other medicine(s) before treatment with this medicine. If you experience allergic reactions like skin rash, itching   or hives, swelling of the face, lips, or tongue, tell your doctor or health care professional right away. In some cases, you may be given additional medicines to help with side effects. Follow all directions for their use. This drug may make you feel generally unwell. This is not uncommon, as chemotherapy can affect healthy cells as well as cancer cells. Report any side effects. Continue your course of treatment even though you feel ill  unless your doctor tells you to stop. Call your doctor or health care professional for advice if you get a fever, chills or sore throat, or other symptoms of a cold or flu. Do not treat yourself. This drug decreases your body's ability to fight infections. Try to avoid being around people who are sick. This medicine may increase your risk to bruise or bleed. Call your doctor or health care professional if you notice any unusual bleeding. Be careful brushing and flossing your teeth or using a toothpick because you may get an infection or bleed more easily. If you have any dental work done, tell your dentist you are receiving this medicine. Avoid taking products that contain aspirin, acetaminophen, ibuprofen, naproxen, or ketoprofen unless instructed by your doctor. These medicines may hide a fever. Do not become pregnant while taking this medicine. Women should inform their doctor if they wish to become pregnant or think they might be pregnant. There is a potential for serious side effects to an unborn child. Talk to your health care professional or pharmacist for more information. Do not breast-feed an infant while taking this medicine. Men are advised not to father a child while receiving this medicine. This product may contain alcohol. Ask your pharmacist or healthcare provider if this medicine contains alcohol. Be sure to tell all healthcare providers you are taking this medicine. Certain medicines, like metronidazole and disulfiram, can cause an unpleasant reaction when taken with alcohol. The reaction includes flushing, headache, nausea, vomiting, sweating, and increased thirst. The reaction can last from 30 minutes to several hours. What side effects may I notice from receiving this medicine? Side effects that you should report to your doctor or health care professional as soon as possible:  allergic reactions like skin rash, itching or hives, swelling of the face, lips, or tongue  breathing  problems  changes in vision  fast, irregular heartbeat  high or low blood pressure  mouth sores  pain, tingling, numbness in the hands or feet  signs of decreased platelets or bleeding - bruising, pinpoint red spots on the skin, black, tarry stools, blood in the urine  signs of decreased red blood cells - unusually weak or tired, feeling faint or lightheaded, falls  signs of infection - fever or chills, cough, sore throat, pain or difficulty passing urine  signs and symptoms of liver injury like dark yellow or brown urine; general ill feeling or flu-like symptoms; light-colored stools; loss of appetite; nausea; right upper belly pain; unusually weak or tired; yellowing of the eyes or skin  swelling of the ankles, feet, hands  unusually slow heartbeat Side effects that usually do not require medical attention (report to your doctor or health care professional if they continue or are bothersome):  diarrhea  hair loss  loss of appetite  muscle or joint pain  nausea, vomiting  pain, redness, or irritation at site where injected  tiredness This list may not describe all possible side effects. Call your doctor for medical advice about side effects. You may report side effects to FDA at   1-800-FDA-1088. Where should I keep my medicine? This drug is given in a hospital or clinic and will not be stored at home. NOTE: This sheet is a summary. It may not cover all possible information. If you have questions about this medicine, talk to your doctor, pharmacist, or health care provider.  2020 [employer]/Gold Standard (2016-12-06 13:14:55)  

## 2019-08-19 ENCOUNTER — Inpatient Hospital Stay: Payer: 59 | Attending: Internal Medicine

## 2019-08-19 ENCOUNTER — Inpatient Hospital Stay (HOSPITAL_BASED_OUTPATIENT_CLINIC_OR_DEPARTMENT_OTHER): Payer: 59 | Admitting: Oncology

## 2019-08-19 ENCOUNTER — Other Ambulatory Visit: Payer: Self-pay

## 2019-08-19 ENCOUNTER — Encounter: Payer: Self-pay | Admitting: *Deleted

## 2019-08-19 DIAGNOSIS — Z5112 Encounter for antineoplastic immunotherapy: Secondary | ICD-10-CM | POA: Insufficient documentation

## 2019-08-19 DIAGNOSIS — C50411 Malignant neoplasm of upper-outer quadrant of right female breast: Secondary | ICD-10-CM | POA: Insufficient documentation

## 2019-08-19 DIAGNOSIS — Z170 Estrogen receptor positive status [ER+]: Secondary | ICD-10-CM | POA: Diagnosis not present

## 2019-08-19 DIAGNOSIS — E039 Hypothyroidism, unspecified: Secondary | ICD-10-CM | POA: Insufficient documentation

## 2019-08-19 DIAGNOSIS — Z5111 Encounter for antineoplastic chemotherapy: Secondary | ICD-10-CM | POA: Insufficient documentation

## 2019-08-19 NOTE — Progress Notes (Signed)
[name]  [phone] [phone]) [phone]  Patient Care Team: Birdie Sons, MD as PCP - General (Family Medicine) [place] (Gynecology) Theodore Demark, RN as Oncology Nurse Navigator Cammie Sickle, MD as Consulting Physician (Hematology and Oncology)   Name of the patient: Connie Bradford  [id number]  1960/05/29   Date of visit: 08/19/19  Diagnosis- Breast Cancer   Chief complaint/Reason for visit- Initial Meeting for [facility], preparing for starting chemotherapy  Heme/Onc history:  Oncology History Overview Note  # MARCH 2021- RUOQ- 26m- [BREAST, RIGHT, LATERAL POSTERIOR  - INVASIVE MAMMARY CARCINOMA, NO SPECIAL TYPE, ASSOCIATED WITH  CALCIFICATIONS.]Portal ER-> 90%; PR- 50-90%; HER 2 FISH- POSITIVE  # S/P LUMPECTOMY pT1a [469m;pN0 [STAGE IA- ER/PRpoS; Her 2 POS]  . BREAST, RIGHT, LATERAL MIDDLE DEPTH (RIBBON-SHAPED CLIP):  STEREOTACTIC-GUIDED CORE BIOPSY: - DUCTAL CARCINOMA IN SITU (DCIS), HIGH-GRADE, ASSOCIATED WITH  CALCIFICATIONS.   #Hypothyroidism-methimazole [Dr.Solum]  # SURVIVORSHIP:   # GENETICS:   DIAGNOSIS:   STAGE:         ;  GOALS:  CURRENT/MOST RECENT THERAPY :     Carcinoma of upper-outer quadrant of right breast in female, estrogen receptor positive ([facility] 07/15/2019 Initial Diagnosis   Carcinoma of upper-outer quadrant of right breast in female, estrogen receptor positive (HCL[place]    Interval history-  Connie Bradford is a [age]ear old female who presents to chemo care clinic today for initial meeting in preparation for starting chemotherapy. I introduced the chemo care clinic and we discussed that the role of the clinic is to assist those who are at an increased risk of emergency room visits and/or complications during the course of chemotherapy treatment. We discussed that the increased risk takes into account factors such as age, performance status, and co-morbidities.  We also discussed that for some, this might include barriers to care such as not having a primary care provider, lack of insurance/transportation, or not being able to afford medications. We discussed that the goal of the program is to help prevent unplanned ER visits and help reduce complications during chemotherapy. We do this by discussing specific risk factors to each individual and identifying ways that we can help improve these risk factors and reduce barriers to care.   ECOG FS:0 - Asymptomatic  Review of systems- Review of Systems  Constitutional: Negative.  Negative for chills, fever, malaise/fatigue and weight loss.  HENT: Negative for congestion, ear pain and tinnitus.   Eyes: Negative.  Negative for blurred vision and double vision.  Respiratory: Negative.  Negative for cough, sputum production and shortness of breath.   Cardiovascular: Negative.  Negative for chest pain, palpitations and leg swelling.  Gastrointestinal: Negative.  Negative for abdominal pain, constipation, diarrhea, nausea and vomiting.  Genitourinary: Negative for dysuria, frequency and urgency.  Musculoskeletal: Negative for back pain and falls.  Skin: Negative.  Negative for rash.  Neurological: Negative.  Negative for weakness and headaches.  Endo/Heme/Allergies: Negative.  Does not bruise/bleed easily.  Psychiatric/Behavioral: Negative.  Negative for depression. The patient is not nervous/anxious and does not have insomnia.      Current treatment- taxol + Herceptin (08/28/19)  Allergies  Allergen Reactions  . Penicillins Rash    Past Medical History:  Diagnosis Date  . Breast cancer ([place]04/2021   [place]  . Dyslipidemia   . Family history of bladder cancer   . Graves disease   . Hyperthyroidism   .  Inborn lipid storage disorder   . Leiomyoma of uterus   . Vitamin D deficiency     Past Surgical History:  Procedure Laterality Date  . APPENDECTOMY  1982   Cyst removed from ovaries   .  BREAST BIOPSY Right 07/05/2019   stereo bx, coil clip, IMC  . BREAST BIOPSY Right 07/05/2019   stereo bx, ribbon clip, DCIS   . BREAST LUMPECTOMY Right 07/31/2019    [facility] and DCIS with SN bx  . COLONOSCOPY WITH PROPOFOL N/A 01/20/2017   Procedure: COLONOSCOPY WITH PROPOFOL;  Surgeon: Jonathon Bellows, MD;  Location: [facility] ENDOSCOPY;  Service: Gastroenterology;  Laterality: N/A;  . [place]  . NECK SURGERY  2013   LIft from weight loss  . PARTIAL MASTECTOMY WITH NEEDLE LOCALIZATION AND AXILLARY SENTINEL LYMPH NODE BX Right 07/31/2019   Procedure: RIGHT PARTIAL MASTECTOMY WITH BRACKETED NEEDLE LOCALIZATION AND LEFT AXILLARY SENTINEL LYMPH NODE BIOPSY;  Surgeon: Herbert Pun, MD;  Location: [facility] ORS;  Service: General;  Laterality: Right;  . PORTACATH PLACEMENT Left 07/31/2019   Procedure: INSERTION PORT-A-CATH LEFT INTERNAL JUGULAR;  Surgeon: Herbert Pun, MD;  Location: [facility] ORS;  Service: General;  Laterality: Left;  . TONSILLECTOMY  1969    Social History   Socioeconomic History  . Marital status: Married    Spouse name: Not on file  . Number of children: 0  . Years of education: Anselm Lis  . Highest education level: Not on file  Occupational History  . Occupation: Full-Time    Comment: [employer] x9  Tobacco Use  . Smoking status: Former Research scientist (life sciences)  . Smokeless tobacco: Never Used  Substance and Sexual Activity  . Alcohol use: Yes    Alcohol/week: 0.0 standard drinks    Comment: RARELY  . Drug use: No  . Sexual activity: Yes    Birth control/protection: Post-menopausal  Other Topics Concern  . Not on file  Social History Narrative   Lives close to [place]; with husband. Quit smoking in 2007 [25 years]; ocassional alcohol. 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    Social Determinants of Health   Financial Resource Strain:   . Difficulty of Paying Living Expenses:   Food Insecurity:   . Worried About Charity fundraiser in the Last Year:    . Arboriculturist in the Last Year:   Transportation Needs:   . Film/video editor (Medical):   Marland Kitchen Lack of Transportation (Non-Medical):   Physical Activity:   . Days of Exercise per Week:   . Minutes of Exercise per Session:   Stress:   . Feeling of Stress :   Social Connections:   . Frequency of Communication with Friends and Family:   . Frequency of Social Gatherings with Friends and Family:   . Attends Religious Services:   . Active Member of Clubs or Organizations:   . Attends Archivist Meetings:   Marland Kitchen Marital Status:   Intimate Partner Violence:   . Fear of Current or Ex-Partner:   . Emotionally Abused:   Marland Kitchen Physically Abused:   . Sexually Abused:     Family History  Problem Relation Age of Onset  . Bladder Cancer Mother   . Diabetes Father   . Heart disease Father   . Bladder Cancer Brother   . Breast cancer Neg Hx      Current Outpatient Medications:  .  B COMPLEX VITAMINS PO, Take 1 tablet by mouth daily. , Disp: , Rfl:  .  Cholecalciferol (VITAMIN D) 125 MCG (5000 UT) CAPS, Take 5,000 Units by mouth daily. , Disp: , Rfl:  .  COLLAGEN PO, Take 1 tablet by mouth daily. Super Collagen w/Biotin, Disp: , Rfl:  .  ibuprofen (ADVIL) 200 MG tablet, Take 600 mg by mouth every 8 (eight) hours as needed (for pain.)., Disp: , Rfl:  .  lidocaine-prilocaine (EMLA) cream, Apply 1 application topically as needed., Disp: 30 g, Rfl: 0 .  methimazole (TAPAZOLE) 5 MG tablet, Take 5 mg by mouth daily. , Disp: , Rfl:  .  Multiple Vitamin (MULTIVITAMIN WITH MINERALS) TABS tablet, Take 1 tablet by mouth daily., Disp: , Rfl:  .  Omega 3-6-9 Fatty Acids (OMEGA 3-6-9 COMPLEX) CAPS, Take 1 capsule by mouth daily., Disp: , Rfl:  .  ondansetron (ZOFRAN) 8 MG tablet, One pill every 8 hours as needed for nausea/vomitting., Disp: 40 tablet, Rfl: 1 .  prochlorperazine (COMPAZINE) 10 MG tablet, Take 1 tablet (10 mg total) by mouth every 6 (six) hours as needed for nausea or vomiting.,  Disp: 40 tablet, Rfl: 1 .  valACYclovir (VALTREX) 500 MG tablet, Take 500 mg by mouth 2 (two) times a week. , Disp: , Rfl:   Physical exam: There were no vitals filed for this visit. Physical Exam Constitutional:      Appearance: Normal appearance.  HENT:     Head: Normocephalic and atraumatic.  Eyes:     Pupils: Pupils are equal, round, and reactive to light.  Cardiovascular:     Rate and Rhythm: Normal rate and regular rhythm.     Heart sounds: Normal heart sounds. No murmur.  Pulmonary:     Effort: Pulmonary effort is normal.     Breath sounds: Normal breath sounds. No wheezing.  Abdominal:     General: Bowel sounds are normal. There is no distension.     Palpations: Abdomen is soft.     Tenderness: There is no abdominal tenderness.  Musculoskeletal:        General: Normal range of motion.     Cervical back: Normal range of motion.  Skin:    General: Skin is warm and dry.     Findings: No rash.  Neurological:     Mental Status: Connie Bradford is alert and oriented to person, place, and time.  Psychiatric:        Judgment: Judgment normal.      CMP Latest Ref Rng & Units 07/15/2019  Glucose 70 - 99 mg/dL 99  BUN 6 - 20 mg/dL 18  Creatinine 0.44 - 1.00 mg/dL 0.85  Sodium 135 - 145 mmol/L 137  Potassium 3.5 - 5.1 mmol/L 4.4  Chloride 98 - 111 mmol/L 103  CO2 22 - 32 mmol/L 25  Calcium 8.9 - 10.3 mg/dL 9.9  Total Protein 6.5 - 8.1 g/dL 8.1  Total Bilirubin 0.3 - 1.2 mg/dL 0.7  Alkaline Phos 38 - 126 U/L 90  AST 15 - 41 U/L 87(H)  ALT 0 - 44 U/L 106(H)   CBC Latest Ref Rng & Units 07/15/2019  WBC 4.0 - 10.5 K/uL 7.3  Hemoglobin 12.0 - 15.0 g/dL 13.4  Hematocrit 36.0 - 46.0 % 41.7  Platelets 150 - 400 K/uL 270    No images are attached to the encounter.  US Abdomen Complete  Result Date: 07/22/2019 CLINICAL DATA:  Elevated LFTs.  New diagnosis of breast cancer. EXAM: ABDOMEN ULTRASOUND COMPLETE COMPARISON:  None. FINDINGS: Gallbladder: No gallstones or wall thickening  visualized. No sonographic Murphy sign noted by  sonographer. Common bile duct: Diameter: 5 mm Liver: No focal lesion identified. Increased parenchymal echogenicity consistent with hepatic steatosis. Portal vein is patent on color Doppler imaging with normal direction of blood flow towards the liver. IVC: No abnormality visualized. Pancreas: Visualized portion unremarkable. Spleen: Size and appearance within normal limits. Right Kidney: Length: 10.0. Echogenicity within normal limits. No mass or hydronephrosis visualized. Left Kidney: Length: 11.6. Echogenicity within normal limits. No mass or hydronephrosis visualized. Abdominal aorta: No aneurysm visualized. Other findings: None. IMPRESSION: 1. No acute abnormality. 2. Echogenic liver suggestive of hepatic steatosis. Electronically Signed   By: Kerby Moors M.D.   On: 07/22/2019 09:25   MR BREAST BILATERAL W WO CONTRAST INC CAD  Result Date: 07/23/2019 CLINICAL DATA:  59 year old female with newly diagnosed right breast cancer. LABS:  None performed on site. EXAM: BILATERAL BREAST MRI WITH AND WITHOUT CONTRAST TECHNIQUE: Multiplanar, multisequence MR images of both breasts were obtained prior to and following the intravenous administration of 8 ml of Gadavist. Three-dimensional MR images were rendered by post-processing of the original MR data on an independent workstation. The three-dimensional MR images were interpreted, and findings are reported in the following complete MRI report for this study. Three dimensional images were evaluated at the independent DynaCad workstation COMPARISON:  Previous exam(s). FINDINGS: Breast composition: b. Scattered fibroglandular tissue. Background parenchymal enhancement: Mild to moderate. Note is made of multiple 2-3 mm enhancing foci scattered throughout the bilateral breasts. Right breast: Susceptibility artifact is seen in association with two post biopsy clips within the lateral right breast at middle and posterior  depth (series 6, image 41/112). These are consistent with the patient's biopsy-proven sites of malignancy. Subtle non-mass enhancement is seen surrounding the post biopsy clips spanning approximately 3.8 x 3.0 cm (AP by transverse dimension). The non-mass enhancement does not extend significantly beyond the post biopsy clips. No additional suspicious mass or non-mass enhancement is seen within the remainder of the right breast. Left breast: No suspicious mass or non-mass enhancement. Lymph nodes: No abnormal appearing lymph nodes. Ancillary findings:  None. IMPRESSION: 1. Post biopsy changes in the lateral right breast at the site of the patient's known disease. No additional suspicious findings in the remainder of the right breast. 2. No suspicious MRI findings on the left. 3. No suspicious lymphadenopathy. RECOMMENDATION: Per clinical treatment plan. BI-RADS CATEGORY  6: Known biopsy-proven malignancy. Electronically Signed   By: Kristopher Oppenheim M.D.   On: 07/23/2019 08:42   NM SENTINEL NODE INJECTION  Result Date: 07/31/2019 CLINICAL DATA:  Right breast cancer. EXAM: NUCLEAR MEDICINE BREAST LYMPHOSCINTIGRAPHY TECHNIQUE: Intradermal injection of radiopharmaceutical was performed at the 12 o'clock, 3 o'clock, 6 o'clock, and 9 o'clock positions around the left/right nipple. The patient was then sent to the operating room where the sentinel node(s) were identified and removed by the surgeon. RADIOPHARMACEUTICALS:  Total of 1 mCi Millipore-filtered Technetium-36msulfur colloid, injected in four aliquots of 0.25 mCi each. IMPRESSION: Uncomplicated intradermal injection of a total of 1 mCi Technetium-956mulfur colloid for purposes of sentinel node identification. Electronically Signed   By: HeKathreen Devoid On: 07/31/2019 10:06   MM Breast Surgical Specimen  Result Date: 07/31/2019 CLINICAL DATA:  Post right breast lumpectomy. EXAM: SPECIMEN RADIOGRAPH OF THE RIGHT BREAST COMPARISON:  Previous exam(s). FINDINGS:  Status post excision of the right breast. Both localization wires and the coil and ribbon shaped biopsy marker clips are present and are marked for pathology. IMPRESSION: Specimen radiograph of the right breast. Electronically Signed  By: Everlean Alstrom M.D.   On: 07/31/2019 14:28   DG Chest Port 1 View  Result Date: 07/31/2019 CLINICAL DATA:  Status post Port-A-Cath placement. EXAM: PORTABLE CHEST 1 VIEW COMPARISON:  None. FINDINGS: The heart size and mediastinal contours are within normal limits. Both lungs are clear. Left internal jugular Port-A-Cath is noted with tip in expected position of the SVC. No pneumothorax or pleural effusion is noted. The visualized skeletal structures are unremarkable. IMPRESSION: Status post left internal jugular Port-A-Cath placement. No pneumothorax is noted. Electronically Signed   By: Marijo Conception M.D.   On: 07/31/2019 15:42   DG C-Arm 1-60 Min-No Report  Result Date: 07/31/2019 Fluoroscopy was utilized by the requesting physician.  No radiographic interpretation.   MM RT PLC BREAST LOC DEV   1ST LESION  INC MAMMO GUIDE  Result Date: 07/31/2019 CLINICAL DATA:  59 year old female with recently diagnosed invasive mammary carcinoma in the lateral posterior right breast at site of coil shaped biopsy marking clip and high-grade ductal carcinoma in situ in the lateral mid right breast at site of ribbon shaped biopsy marking clip. Patient presents for wire bracketing prior to planned lumpectomy. EXAM: NEEDLE LOCALIZATION OF THE RIGHT BREAST WITH MAMMO GUIDANCE COMPARISON:  Previous exams. FINDINGS: Patient presents for needle localization prior to right breast lumpectomy. I met with the patient and we discussed the procedure of needle localization including benefits and alternatives. We discussed the high likelihood of a successful procedure. We discussed the risks of the procedure, including infection, bleeding, tissue injury, and further surgery. Informed, written  consent was given. The usual time-out protocol was performed immediately prior to the procedure. Site 1: Using mammographic guidance, sterile technique, 1% lidocaine and a 7 cm modified Kopans needle, the coil shaped biopsy marking clip in the lateral posterior right breast was localized using lateral to medial approach. The images were marked for Dr. Windell Moment. Site 2: Using mammographic guidance, sterile technique, 1% lidocaine and a 5 cm modified Kopans needle, the ribbon shaped biopsy marking clip in the lateral mid right breastwas localized using lateral to medial approach. The images were marked for Dr. Windell Moment. IMPRESSION: Needle localization right breast. No apparent complications. Electronically Signed   By: Everlean Alstrom M.D.   On: 07/31/2019 08:24   MM RT PLC BREAST LOC DEV   EA ADD LESION  INC MAMMO GUIDE  Result Date: 07/31/2019 CLINICAL DATA:  59 year old female with recently diagnosed invasive mammary carcinoma in the lateral posterior right breast at site of coil shaped biopsy marking clip and high-grade ductal carcinoma in situ in the lateral mid right breast at site of ribbon shaped biopsy marking clip. Patient presents for wire bracketing prior to planned lumpectomy. EXAM: NEEDLE LOCALIZATION OF THE RIGHT BREAST WITH MAMMO GUIDANCE COMPARISON:  Previous exams. FINDINGS: Patient presents for needle localization prior to right breast lumpectomy. I met with the patient and we discussed the procedure of needle localization including benefits and alternatives. We discussed the high likelihood of a successful procedure. We discussed the risks of the procedure, including infection, bleeding, tissue injury, and further surgery. Informed, written consent was given. The usual time-out protocol was performed immediately prior to the procedure. Site 1: Using mammographic guidance, sterile technique, 1% lidocaine and a 7 cm modified Kopans needle, the coil shaped biopsy marking clip in the  lateral posterior right breast was localized using lateral to medial approach. The images were marked for Dr. Windell Moment. Site 2: Using mammographic guidance, sterile technique, 1% lidocaine and a  5 cm modified Kopans needle, the ribbon shaped biopsy marking clip in the lateral mid right breastwas localized using lateral to medial approach. The images were marked for Dr. Windell Moment. IMPRESSION: Needle localization right breast. No apparent complications. Electronically Signed   By: Everlean Alstrom M.D.   On: 07/31/2019 08:24    Assessment and plan- Patient is a 59 y.o. female who presents to [facility] for initial meeting in preparation for starting chemotherapy for the treatment of breast cancer.    1. HPI: Connie Bradford is a 59 year old female who was recently diagnosed with breast cancer ER PR positive HER-2/neu positive clinical stage I with past medical history for hypothyroidism.  Connie Bradford presented for usual yearly mammogram which revealed 2 new calcifications. Biopsy revealed  invasive mammary carcinoma and ductal carcinoma in situ, high-grade. Given her HER-2/neu positive disease patient will require adjuvant chemotherapy post surgery with Herceptin x1 year. Had lumpectomy on 07/31/19 with SLN biopsy by Dr. Windell Moment. Scheduled to begin treatment next week with Taxol and Herceptin next week.   2. Chemo Care Clinic/High Risk for ER/Hospitalization during chemotherapy- We discussed the role of the chemo care clinic and identified patient specific risk factors. I discussed that patient was identified as high risk primarily based on stage of disease.   Patient has past medical history positive for: Past Medical History:  Diagnosis Date  . Breast cancer ([place]) 07/2019   [place] and DCIS  . Dyslipidemia   . Family history of bladder cancer   . Graves disease   . Hyperthyroidism   . Inborn lipid storage disorder   . Leiomyoma of uterus   . Vitamin D deficiency     Patient has past surgical  history positive for: Past Surgical History:  Procedure Laterality Date  . APPENDECTOMY  1982   Cyst removed from ovaries   . BREAST BIOPSY Right 07/05/2019   stereo bx, coil clip, IMC  . BREAST BIOPSY Right 07/05/2019   stereo bx, ribbon clip, DCIS   . BREAST LUMPECTOMY Right 07/31/2019    [facility] and DCIS with SN bx  . COLONOSCOPY WITH PROPOFOL N/A 01/20/2017   Procedure: COLONOSCOPY WITH PROPOFOL;  Surgeon: Jonathon Bellows, MD;  Location: [facility] ENDOSCOPY;  Service: Gastroenterology;  Laterality: N/A;  . [place]  . NECK SURGERY  2013   LIft from weight loss  . PARTIAL MASTECTOMY WITH NEEDLE LOCALIZATION AND AXILLARY SENTINEL LYMPH NODE BX Right 07/31/2019   Procedure: RIGHT PARTIAL MASTECTOMY WITH BRACKETED NEEDLE LOCALIZATION AND LEFT AXILLARY SENTINEL LYMPH NODE BIOPSY;  Surgeon: Herbert Pun, MD;  Location: [facility] ORS;  Service: General;  Laterality: Right;  . PORTACATH PLACEMENT Left 07/31/2019   Procedure: INSERTION PORT-A-CATH LEFT INTERNAL JUGULAR;  Surgeon: Herbert Pun, MD;  Location: [facility] ORS;  Service: General;  Laterality: Left;  . TONSILLECTOMY  1969    Based on our high risk symptom management report; this patient has a high risk of ED utilization.  The percentage below indicates how "at risk "  this patient based on the factors in this table within one year.   General Risk Score: 1  Values used to calculate this score:   Points  Metrics      0        Age: 59      1        Hospital Admissions: 1      0        ED Visits: 0      0  Has Chronic Obstructive Pulmonary Disease: No      0        Has Diabetes: No      0        Has Congestive Heart Failure: No      0        Has liver disease: No      0        Has Depression: No      0        Current PCP: Birdie Sons, MD      0        Has Medicaid: No   3. We discussed that social determinants of health may have significant impacts on health and outcomes for cancer patients.   Today we discussed specific social determinants of performance status, alcohol use, depression, financial needs, food insecurity, housing, interpersonal violence, social connections, stress, tobacco use, and transportation.    After lengthy discussion the following were identified as areas of need: None  Outpatient services: We discussed options including home based and outpatient services, DME and care program. We discusssed that patients who participate in regular physical activity report fewer negative impacts of cancer and treatments and report less fatigue.   Financial Concerns: We discussed that living with cancer can create tremendous financial burden.  We discussed options for assistance. I asked that if assistance is needed in affording medications or paying bills to please let us know so that we can provide assistance. We discussed options for food including social services, [employer] ($50 every 2 weeks) and onsite food pantry.  We will also notify [facility] to see if cancer center can provide additional support.  Referral to Social work: Introduced Education officer, museum Elease Etienne and the services he can provide such as support with [employer], cell phone and gas vouchers.   Support groups: We discussed options for support groups at the cancer center. If interested, please notify nurse navigator to enroll. We discussed options for managing stress including healthy eating, exercise as well as participating in no charge counseling services at the cancer center and support groups.  If these are of interest, patient can notify either myself or primary nursing team.We discussed options for management including medications and referral to quit Smart program  Transportation: We discussed options for transportation including acta, paratransit, bus routes, link transit, taxi/uber/lyft, and cancer center [place].  I have notified primary oncology team who will help assist with arranging Lucianne Lei  transportation for appointments when/if needed. We also discussed options for transportation on short notice/acute visits.  Palliative care services: We have palliative care services available in the cancer center to discuss goals of care and advanced care planning.  Please let us know if you have any questions or would like to speak to our palliative nurse practitioner.  Symptom Management Clinic: We discussed our symptom management clinic which is available for acute concerns while receiving treatment such as nausea, vomiting or diarrhea.  We can be reached via telephone at [phone] or through my chart.  We are available for virtual or in person visits on the same day from 830 to 4 PM Monday through Friday. Connie Bradford denies needing specific assistance at this time and Connie Bradford will be followed by Dr. Rogue Bussing clinical team.  Plan: Discussed symptom management clinic. Discussed palliative care services. Discussed resources that are available here at the cancer center. Discussed medications and new prescriptions to begin treatment such as anti-nausea or steroids.   Disposition: RTC on  08/20/19 for MUGA Scan.  RTC on 08/30/19 for labs, md assessment and cycle 1.   Visit Diagnosis 1. Carcinoma of upper-outer quadrant of right breast in female, estrogen receptor positive ([place])     Patient expressed understanding and was in agreement with this plan. Connie Bradford also understands that Connie Bradford can call clinic at any time with any questions, concerns, or complaints.   Greater than 50% was spent in counseling and coordination of care with this patient including but not limited to discussion of the relevant topics above (See A&P) including, but not limited to diagnosis and management of acute and chronic medical conditions.   [place] [facility] [place]  CC: Dr. Rogue Bussing

## 2019-08-19 NOTE — Research (Addendum)
Met with patient Connie Bradford following her chemotherapy education class this morning for purpose of discussing and offering participation in the [place] and the [place] studies. Per recommendation of Dr. Rogue Bussing, the patient is a good candidate for these studies and he plans to give her Taxol plus Herceptin beginning on 08/28/2019. Connie Bradford voices that she is overwhelmed with information at present, but is receptive to learning more about the studies. Brief overview of each study was presented to patient highlighting the purpose of each study, the measures used to conduct the studies, and the time frame for study visits. Patient informed that we schedule study visits at time points when she will be in the clinic anyway in order to avoid extra visits, that participation is strictly voluntary and that she will not receive any penalty for electing not to participate. Also informed her that the UPBEAT study requires 3 different time points in which she would need to travel to [place] to have a cardiac MRI conducted - at baseline, 3 months and 24 months. Copies of informed consent forms for each study were provided to the patient along with contact information for myself and Jeral Fruit, RN. Patient is to review the information at home and plans made for me to contact her in 2-3 days to discuss whether she wants to proceed with study participation, and provide written consent. Patient was thanked for considering clinical trials. Connie Bradford, BSN, MHA, OCN 08/19/2019 12:01 PM  T/C made to patient Connie Bradford to determine whether she is interested in participating in the [place] study or the SWOG S1714 study - or both. Patient reports she has too much going on with new chemotherapy and working and cannot manage the trips to [place] for the UPBEAT study, so she has declined this one. Connie Bradford voices interest in the [phone] Neuropathy study, but states she would not be able to come to  the clinic outside of her scheduled treatment visits due to her work schedule. Explained to patient that she will be receiving Herceptin for a total of one year, and we should be able to schedule all of her study visits at a time while she has to be in clinic for treatment anyway. Connie Bradford states she will take another look at that study and discuss it with her husband again. Plans made to follow up with her on Monday to determine whether she is interested in moving forward with it. Patient has contact information for myself and Jeral Fruit, RN in the event she develops further questions. Connie Bradford, BSN, MHA, OCN 08/22/2019 2:35 PM  Spoke with Connie Bradford by phone again this afternoon and she reports she has decided that she will participate in the SWOG S1714 Neuropathy study. The patient begins treatment on Wednesday morning - 08/28/2019, and has agreed to come in early that morning to provide written consent for the study. She does not have any additional study-related questions at present, but states she will review the consent form again and contact me tomorrow if she has any questions. Instructed her that I will get her written consent, collect study labs, have her complete questionnaires and preform the Neuropathy assessment all prior to her receiving her first Taxol infusion. Dr. Rogue Bussing informed of patient decision to participate in the study. Connie Bradford, BSN, MHA, OCN 08/26/2019 1:33 PM  Patient Connie Bradford arrived in clinic this morning before 8:00am as planned in order to provide written consent to participate in the [facility] S1714  research study. Patient states she has read the consent form and does not have any additional questions this morning. Brief overview of study procedures we will complete this morning was provided to patient. Patient provided written consent for [phone] Research Study Informed Consent Document - Protocol Version Date: 02/24/19 and the corresponding [facility] HIPAA  form dated 06/16/17. She also signed the [facility] Release of information form. Patient was given copies of all signed documents for her records, as well as contact information for myself and Jeral Fruit, RN. Patient was alone this morning, but reports she has previously discussed study participation with her husband. Patient was found to meet all of the eligibility criteria and none of the ineligibility criteria for the study. 2nd eligibility review was completed by Jeral Fruit, RN and Dr. Rogue Bussing signed in agreement that the patient is eligible to participate in the study. After all baseline assessments completed, the patient was registered to the study in OPEN. Please see Research encounter note dated 08/28/2019 for details of the baseline study assessments. No study assessments or procedures were conducted prior to patient signing ICF. Connie Bradford, BSN, MHA, OCN 08/28/2019 9:38 AM  Late entry: on 08/28/2019 prior to patient being registered to the study and during her consent visit, Ms Puller verified that  She had quit smoking in 2007, and that she smoked an average of one pack per day for 25 year prior to quitting. T/C made to patient later this day to verify specific amounts of the following vitamins within her multiple vitamin supplements: Vitamin B6 - 69m, Vitamin B12 - 1011m Fish oil - 103038mithin her Omega 3-6-9 supplement. She is not taking any of these supplements for neuropathy. This information was previously documented on paper by this RN. KayYolande JollySN, MHA, OCN 01/15/2020 11:21 AM

## 2019-08-20 ENCOUNTER — Other Ambulatory Visit: Payer: Self-pay

## 2019-08-20 ENCOUNTER — Ambulatory Visit
Admission: RE | Admit: 2019-08-20 | Discharge: 2019-08-20 | Disposition: A | Discharge status: Home / Self Care | Payer: 59 | Source: Ambulatory Visit | Attending: Internal Medicine | Admitting: Internal Medicine

## 2019-08-20 DIAGNOSIS — Z5111 Encounter for antineoplastic chemotherapy: Secondary | ICD-10-CM | POA: Diagnosis not present

## 2019-08-20 DIAGNOSIS — C50411 Malignant neoplasm of upper-outer quadrant of right female breast: Secondary | ICD-10-CM | POA: Insufficient documentation

## 2019-08-20 DIAGNOSIS — Z170 Estrogen receptor positive status [ER+]: Secondary | ICD-10-CM | POA: Insufficient documentation

## 2019-08-20 MED ORDER — TECHNETIUM TC 99M-LABELED RED BLOOD CELLS IV KIT
22.0000 | PACK | Freq: Once | INTRAVENOUS | Status: AC | PRN
  Administered 2019-08-20: 20.8 via INTRAVENOUS

## 2019-08-20 NOTE — Progress Notes (Signed)
START ON PATHWAY REGIMEN - Breast     Cycle 1: A cycle is 7 days:     Trastuzumab-xxxx      Paclitaxel    Cycles 2 through 12: A cycle is every 7 days:     Trastuzumab-xxxx      Paclitaxel    Cycles 13 through 25: A cycle is every 21 days:     Trastuzumab-xxxx   **Always confirm dose/schedule in your pharmacy ordering system**  Patient Characteristics: Postoperative without Neoadjuvant Therapy (Pathologic Staging), Invasive Disease, Adjuvant Therapy, HER2 Positive, ER Positive, Node Negative, pT1a, pN0/N86m, Chemotherapy Indicated Therapeutic Status: Postoperative without Neoadjuvant Therapy (Pathologic Staging) AJCC Grade: G3 AJCC N Category: pN0 AJCC M Category: cM0 ER Status: Positive (+) AJCC 8 Stage Grouping: IA HER2 Status: Positive (+) Oncotype Dx Recurrence Score: Not Appropriate AJCC T Category: pT1a PR Status: Positive (+) Intervention Indicated: Chemotherapy Intent of Therapy: Curative Intent, Discussed with Patient

## 2019-08-21 NOTE — Progress Notes (Signed)
Pharmacist Chemotherapy Monitoring - Initial Assessment    Anticipated start date: 08/28/19   Regimen:  . Are orders appropriate based on the patient's diagnosis, regimen, and cycle? Yes . Does the plan date match the patient's scheduled date? Yes . Is the sequencing of drugs appropriate? Yes . Are the premedications appropriate for the patient's regimen? Yes . Prior Authorization for treatment is: Pending If applicable, is the correct biosimilar selected based on the patient's insurance? No have aetna and ogivri ordered sent message to financial team   Organ Function and Labs: Marland Kitchen Are dose adjustments needed based on the patient's renal function, hepatic function, or hematologic function? No . Are appropriate labs ordered prior to the start of patient's treatment? Yes . Other organ system assessment, if indicated: trastuzumab-deruztecan: Echo/ MUGA . The following baseline labs, if indicated, have been ordered: N/A  Dose Assessment: . Are the drug doses appropriate? Yes . Are the following correct: o Drug concentrations Yes o IV fluid compatible with drug Yes o Administration routes Yes o Timing of therapy Yes . If applicable, does the patient have documented access for treatment and/or plans for port-a-cath placement?  . If applicable, have lifetime cumulative doses been properly documented and assessed? no Lifetime Dose Tracking  No doses have been documented on this patient for the following tracked chemicals: Doxorubicin, Epirubicin, Idarubicin, Daunorubicin, Mitoxantrone, Bleomycin, Oxaliplatin, Carboplatin, Liposomal Doxorubicin  o   Toxicity Monitoring/Prevention: . The patient has the following take home antiemetics prescribed: Ondansetron, Prochlorperazine and Lorazepam . The patient has the following take home medications prescribed: VZV prophylaxis . Medication allergies and previous infusion related reactions, if applicable, have been reviewed and addressed. Yes . The  patient's current medication list has been assessed for drug-drug interactions with their chemotherapy regimen. no significant drug-drug interactions were identified on review.  Order Review: . Are the treatment plan orders signed? No . Is the patient scheduled to see a provider prior to their treatment? Yes  I verify that I have reviewed each item in the above checklist and answered each question accordingly.  Connie Bradford 08/21/2019 8:41 AM

## 2019-08-27 ENCOUNTER — Other Ambulatory Visit: Payer: Self-pay

## 2019-08-27 ENCOUNTER — Encounter: Payer: Self-pay | Admitting: Internal Medicine

## 2019-08-28 ENCOUNTER — Inpatient Hospital Stay (HOSPITAL_BASED_OUTPATIENT_CLINIC_OR_DEPARTMENT_OTHER): Payer: 59 | Admitting: Internal Medicine

## 2019-08-28 ENCOUNTER — Inpatient Hospital Stay: Payer: 59

## 2019-08-28 ENCOUNTER — Encounter: Payer: Self-pay | Admitting: *Deleted

## 2019-08-28 VITALS — BP 139/72 | HR 74 | Resp 16

## 2019-08-28 DIAGNOSIS — Z170 Estrogen receptor positive status [ER+]: Secondary | ICD-10-CM | POA: Diagnosis not present

## 2019-08-28 DIAGNOSIS — Z5112 Encounter for antineoplastic immunotherapy: Secondary | ICD-10-CM | POA: Diagnosis not present

## 2019-08-28 DIAGNOSIS — C50411 Malignant neoplasm of upper-outer quadrant of right female breast: Secondary | ICD-10-CM | POA: Diagnosis not present

## 2019-08-28 DIAGNOSIS — Z95828 Presence of other vascular implants and grafts: Secondary | ICD-10-CM

## 2019-08-28 DIAGNOSIS — Z5111 Encounter for antineoplastic chemotherapy: Secondary | ICD-10-CM

## 2019-08-28 LAB — CBC WITH DIFFERENTIAL/PLATELET
Abs Immature Granulocytes: 0.03 10*3/uL (ref 0.00–0.07)
Basophils Absolute: 0.1 10*3/uL (ref 0.0–0.1)
Basophils Relative: 2 %
Eosinophils Absolute: 0.2 10*3/uL (ref 0.0–0.5)
Eosinophils Relative: 4 %
HCT: 35.9 % — ABNORMAL LOW (ref 36.0–46.0)
Hemoglobin: 11.5 g/dL — ABNORMAL LOW (ref 12.0–15.0)
Immature Granulocytes: 1 %
Lymphocytes Relative: 24 %
Lymphs Abs: 1.2 10*3/uL (ref 0.7–4.0)
MCH: 28.1 pg (ref 26.0–34.0)
MCHC: 32 g/dL (ref 30.0–36.0)
MCV: 87.8 fL (ref 80.0–100.0)
Monocytes Absolute: 0.4 10*3/uL (ref 0.1–1.0)
Monocytes Relative: 9 %
Neutro Abs: 2.9 10*3/uL (ref 1.7–7.7)
Neutrophils Relative %: 60 %
Platelets: 206 10*3/uL (ref 150–400)
RBC: 4.09 MIL/uL (ref 3.87–5.11)
RDW: 13.6 % (ref 11.5–15.5)
WBC: 4.8 10*3/uL (ref 4.0–10.5)
nRBC: 0 % (ref 0.0–0.2)

## 2019-08-28 LAB — COMPREHENSIVE METABOLIC PANEL
ALT: 37 U/L (ref 0–44)
AST: 36 U/L (ref 15–41)
Albumin: 4 g/dL (ref 3.5–5.0)
Alkaline Phosphatase: 74 U/L (ref 38–126)
Anion gap: 10 (ref 5–15)
BUN: 15 mg/dL (ref 6–20)
CO2: 24 mmol/L (ref 22–32)
Calcium: 9.2 mg/dL (ref 8.9–10.3)
Chloride: 105 mmol/L (ref 98–111)
Creatinine, Ser: 0.88 mg/dL (ref 0.44–1.00)
GFR calc Af Amer: >60 mL/min (ref >60)
GFR calc non Af Amer: >60 mL/min (ref >60)
Glucose, Bld: 173 mg/dL — ABNORMAL HIGH (ref 70–99)
Potassium: 3.9 mmol/L (ref 3.5–5.1)
Sodium: 139 mmol/L (ref 135–145)
Total Bilirubin: 0.9 mg/dL (ref 0.3–1.2)
Total Protein: 6.9 g/dL (ref 6.5–8.1)

## 2019-08-28 MED ORDER — FAMOTIDINE IN NACL 20-0.9 MG/50ML-% IV SOLN
20.0000 mg | Freq: Once | INTRAVENOUS | Status: AC
  Administered 2019-08-28: 11:00:00 20 mg via INTRAVENOUS
  Filled 2019-08-28: qty 50

## 2019-08-28 MED ORDER — SODIUM CHLORIDE 0.9 % IV SOLN
Freq: Once | INTRAVENOUS | Status: AC
  Filled 2019-08-28: qty 250

## 2019-08-28 MED ORDER — DIPHENHYDRAMINE HCL 50 MG/ML IJ SOLN
50.0000 mg | Freq: Once | INTRAMUSCULAR | Status: AC
  Administered 2019-08-28: 50 mg via INTRAVENOUS
  Filled 2019-08-28: qty 1

## 2019-08-28 MED ORDER — SODIUM CHLORIDE 0.9 % IV SOLN
20.0000 mg | Freq: Once | INTRAVENOUS | Status: AC
  Administered 2019-08-28: 10:00:00 20 mg via INTRAVENOUS
  Filled 2019-08-28: qty 20

## 2019-08-28 MED ORDER — TRASTUZUMAB-ANNS CHEMO 150 MG IV SOLR
4.0000 mg/kg | Freq: Once | INTRAVENOUS | Status: AC
  Administered 2019-08-28: 12:00:00 336 mg via INTRAVENOUS
  Filled 2019-08-28: qty 16

## 2019-08-28 MED ORDER — SODIUM CHLORIDE 0.9 % IV SOLN
80.0000 mg/m2 | Freq: Once | INTRAVENOUS | Status: AC
  Administered 2019-08-28: 156 mg via INTRAVENOUS
  Filled 2019-08-28: qty 26

## 2019-08-28 MED ORDER — ACETAMINOPHEN 325 MG PO TABS
650.0000 mg | ORAL_TABLET | Freq: Once | ORAL | Status: AC
  Administered 2019-08-28: 650 mg via ORAL
  Filled 2019-08-28: qty 2

## 2019-08-28 MED ORDER — SODIUM CHLORIDE 0.9% FLUSH
10.0000 mL | INTRAVENOUS | Status: DC | PRN
  Filled 2019-08-28: qty 10

## 2019-08-28 MED ORDER — SODIUM CHLORIDE 0.9% FLUSH
10.0000 mL | INTRAVENOUS | Status: DC | PRN
  Administered 2019-08-28: 10 mL via INTRAVENOUS
  Filled 2019-08-28: qty 10

## 2019-08-28 MED ORDER — HEPARIN SOD (PORK) LOCK FLUSH 100 UNIT/ML IV SOLN
INTRAVENOUS | Status: AC
  Filled 2019-08-28: qty 5

## 2019-08-28 MED ORDER — HEPARIN SOD (PORK) LOCK FLUSH 100 UNIT/ML IV SOLN
500.0000 [IU] | Freq: Once | INTRAVENOUS | Status: AC | PRN
  Administered 2019-08-28: 16:00:00 500 [IU]
  Filled 2019-08-28: qty 5

## 2019-08-28 NOTE — Progress Notes (Signed)
[facility] [place] NOTE  Patient Care Team: Birdie Sons, MD as PCP - General (Family Medicine) [facility] Gyn (Gynecology) Theodore Demark, RN as Oncology Nurse Navigator Cammie Sickle, MD as Consulting Physician (Hematology and Oncology)  CHIEF COMPLAINTS/PURPOSE OF CONSULTATION: Breast cancer  #  Oncology History Overview Note  # MARCH 2021- RUOQ- 12m- [BREAST, RIGHT, LATERAL POSTERIOR  - INVASIVE MAMMARY CARCINOMA, NO SPECIAL TYPE, ASSOCIATED WITH  CALCIFICATIONS.][place] ER-> 90%; PR- 50-90%; HER 2 FISH- POSITIVE  # S/P LUMPECTOMY [Dr.Cintron] pT1a [45m;pN0 [STAGE IA- ER/PRpoS; Her 2 POS]  . BREAST, RIGHT, LATERAL MIDDLE DEPTH ([place]ED CL[place]):  STEREOTACTIC-GUIDED CORE BIOPSY: - DUCTAL CARCINOMA IN SITU (DCIS), HIGH-GRADE, ASSOCIATED WITH  CALCIFICATIONS.   # May 14th,2021- TH-H  #Hypothyroidism-methimazole [Dr.Solum]  # SURVIVORSHIP:   # GENETICS:   DIAGNOSIS: Breast cancer  STAGE:   1      ;  GOALS: Cure  CURRENT/MOST RECENT THERAPY : Taxol Herceptin   Carcinoma of upper-outer quadrant of right breast in female, estrogen receptor positive ([place] 07/[place] Initial Diagnosis   Carcinoma of upper-outer quadrant of right breast in female, estrogen receptor positive ([place]  08/28/2019 -  Chemotherapy   The patient had PACLitaxel (TAXOL) 156 mg in sodium chloride 0.9 % 250 mL chemo infusion (</= '80mg'$ /m2), 80 mg/m2 = 156 mg, Intravenous,  Once, 1 of 3 cycles Administration: 156 mg (08/28/2019) trastuzumab-anns (KANJINTI) 336 mg in sodium chloride 0.9 % 250 mL chemo infusion, 4 mg/kg = 336 mg (100 % of original dose 4 mg/kg), Intravenous,  Once, 1 of 16 cycles Dose modification: 4 mg/kg (original dose 4 mg/kg, Cycle 1, Reason: Other (see comments), Comment: loading dose, biosimilar per insurance) Administration: 336 mg (08/28/2019)  for chemotherapy treatment.       HISTORY OF PRESENTING ILLNESS:  Connie Bradford [age].o.  female with recently  diagnosed breast cancer early stage-ER/PR positive HER-2/neu positive is here status post lumpectomy-is here to proceed with chemotherapy.  Patient denies any fever chills denies any nausea vomiting abdominal pain.    Review of Systems  Constitutional: Negative for chills, diaphoresis, fever, malaise/fatigue and weight loss.  HENT: Negative for nosebleeds and sore throat.   Eyes: Negative for double vision.  Respiratory: Negative for cough, hemoptysis, sputum production, shortness of breath and wheezing.   Cardiovascular: Negative for chest pain, palpitations, orthopnea and leg swelling.  Gastrointestinal: Negative for abdominal pain, blood in stool, constipation, diarrhea, heartburn, melena, nausea and vomiting.  Genitourinary: Negative for dysuria, frequency and urgency.  Musculoskeletal: Negative for back pain and joint pain.  Skin: Negative.  Negative for itching and rash.  Neurological: Negative for dizziness, tingling, focal weakness, weakness and headaches.  Endo/Heme/Allergies: Does not bruise/bleed easily.  Psychiatric/Behavioral: Negative for depression. The patient is not nervous/anxious and does not have insomnia.      MEDICAL HISTORY:  Past Medical History:  Diagnosis Date  . [place] ([place]   [facility] [place]  . Dyslipidemia   . Family history of bladder cancer   . Graves disease   . Hyperthyroidism   . Inborn lipid storage disorder   . Leiomyoma of uterus   . Vitamin D deficiency     SURGICAL HISTORY: Past Surgical History:  Procedure Laterality Date  . APPENDECTOMY  1982   Cyst removed from ovaries   . BREAST BIOPSY Right 07/05/2019   stereo bx, coil clip, IMC  . BREAST BIOPSY Right 07/05/2019   stereo bx, ribbon clip, DCIS   .  BREAST LUMPECTOMY Right 07/31/2019    [facility] and DCIS with SN bx  . COLONOSCOPY WITH PROPOFOL N/A 01/20/2017   Procedure: COLONOSCOPY WITH PROPOFOL;  Surgeon: Jonathon Bellows, MD;  Location: [facility] ENDOSCOPY;  Service:  Gastroenterology;  Laterality: N/A;  . [place]  . NECK SURGERY  2013   LIft from weight loss  . PARTIAL MASTECTOMY WITH NEEDLE LOCALIZATION AND AXILLARY SENTINEL LYMPH NODE BX Right 07/31/2019   Procedure: RIGHT PARTIAL MASTECTOMY WITH BRACKETED NEEDLE LOCALIZATION AND LEFT AXILLARY SENTINEL LYMPH NODE BIOPSY;  Surgeon: Herbert Pun, MD;  Location: [facility] ORS;  Service: General;  Laterality: Right;  . PORTACATH PLACEMENT Left 07/31/2019   Procedure: INSERTION PORT-A-CATH LEFT INTERNAL JUGULAR;  Surgeon: Herbert Pun, MD;  Location: [facility] ORS;  Service: General;  Laterality: Left;  . TONSILLECTOMY  1969    SOCIAL HISTORY: Social History   Socioeconomic History  . Marital status: Married    Spouse name: Not on file  . Number of children: 0  . Years of education: Anselm Lis  . Highest education level: Not on file  Occupational History  . Occupation: Full-Time    Comment: [employer] x9  Tobacco Use  . Smoking status: Former Research scientist (life sciences)  . Smokeless tobacco: Never Used  Substance and Sexual Activity  . Alcohol use: Yes    Alcohol/week: 0.0 standard drinks    Comment: RARELY  . Drug use: No  . Sexual activity: Yes    Birth control/protection: Post-menopausal  Other Topics Concern  . Not on file  Social History Narrative   Lives close to [place]; with husband. Quit smoking in 2007 [25 years]; ocassional alcohol. 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    Social Determinants of Health   Financial Resource Strain:   . Difficulty of Paying Living Expenses:   Food Insecurity:   . Worried About Charity fundraiser in the Last Year:   . Arboriculturist in the Last Year:   Transportation Needs:   . Film/video editor (Medical):   Marland Kitchen Lack of Transportation (Non-Medical):   Physical Activity:   . Days of Exercise per Week:   . Minutes of Exercise per Session:   Stress:   . Feeling of Stress :   Social Connections:   . Frequency of  Communication with Friends and Family:   . Frequency of Social Gatherings with Friends and Family:   . Attends Religious Services:   . Active Member of Clubs or Organizations:   . Attends Archivist Meetings:   Marland Kitchen Marital Status:   Intimate Partner Violence:   . Fear of Current or Ex-Partner:   . Emotionally Abused:   Marland Kitchen Physically Abused:   . Sexually Abused:     FAMILY HISTORY: Family History  Problem Relation Age of Onset  . Bladder Cancer Mother   . Diabetes Father   . Heart disease Father   . Bladder Cancer Brother   . Breast cancer Neg Hx     ALLERGIES:  is allergic to penicillins.  MEDICATIONS:  Current Outpatient Medications  Medication Sig Dispense Refill  . B COMPLEX VITAMINS PO Take 1 tablet by mouth daily.     . Cholecalciferol (VITAMIN D) 125 MCG (5000 UT) CAPS Take 5,000 Units by mouth daily.     . COLLAGEN PO Take 1 tablet by mouth daily. Super Collagen w/Biotin    . ibuprofen (ADVIL) 200 MG tablet Take 600 mg by mouth every 8 (eight) hours as needed (for pain.).    Marland Kitchen  lidocaine-prilocaine (EMLA) cream Apply 1 application topically as needed. 30 g 0  . methimazole (TAPAZOLE) 5 MG tablet Take 5 mg by mouth daily.     . Multiple Vitamin (MULTIVITAMIN WITH MINERALS) TABS tablet Take 1 tablet by mouth daily.    . Omega 3-6-9 Fatty Acids (OMEGA 3-6-9 COMPLEX) CAPS Take 1 capsule by mouth daily.    . ondansetron (ZOFRAN) 8 MG tablet One pill every 8 hours as needed for nausea/vomitting. 40 tablet 1  . prochlorperazine (COMPAZINE) 10 MG tablet Take 1 tablet (10 mg total) by mouth every 6 (six) hours as needed for nausea or vomiting. 40 tablet 1  . valACYclovir (VALTREX) 500 MG tablet Take 500 mg by mouth 2 (two) times a week.      No current facility-administered medications for this visit.    PHYSICAL EXAMINATION: ECOG PERFORMANCE STATUS: 0 - Asymptomatic  Vitals:   08/28/19 0830  BP: (!) 155/78  Pulse: 73  Resp: 18  Temp: 97.8 F (36.6 C)   Filed  Weights   08/28/19 0830  Weight: 181 lb 4.8 oz (82.2 kg)    Physical Exam  Constitutional: She is oriented to person, place, and time and well-developed, well-nourished, and in no distress.  HENT:  Head: Normocephalic and atraumatic.  Mouth/Throat: Oropharynx is clear and moist. No oropharyngeal exudate.  Eyes: Pupils are equal, round, and reactive to light.  Cardiovascular: Normal rate and regular rhythm.  Pulmonary/Chest: Effort normal and breath sounds normal. No respiratory distress. She has no wheezes.  Abdominal: Soft. Bowel sounds are normal. She exhibits no distension and no mass. There is no abdominal tenderness. There is no rebound and no guarding.  Musculoskeletal:        General: No tenderness or edema. Normal range of motion.     Cervical back: Normal range of motion and neck supple.  Neurological: She is alert and oriented to person, place, and time.  Skin: Skin is warm.  Psychiatric: Affect normal.   LABORATORY DATA:  I have reviewed the data as listed Lab Results  Component Value Date   WBC 4.8 08/28/2019   HGB 11.5 (L) 08/28/2019   HCT 35.9 (L) 08/28/2019   MCV 87.8 08/28/2019   PLT 206 08/28/2019   Recent Labs    07/15/19 1205 08/28/19 0818  NA 137 139  K 4.4 3.9  CL 103 105  CO2 25 24  GLUCOSE 99 173*  BUN 18 15  CREATININE 0.85 0.88  CALCIUM 9.9 9.2  GFRNONAA >60 >60  GFRAA >60 >60  PROT 8.1 6.9  ALBUMIN 4.7 4.0  AST 87* 36  ALT 106* 37  ALKPHOS 90 74  BILITOT 0.7 0.9    RADIOGRAPHIC STUDIES: I have personally reviewed the radiological images as listed and agreed with the findings in the report. NM Cardiac [place]  Result Date: 08/20/2019 CLINICAL DATA:  Breast cancer at upper outer quadrant RIGHT breast, pre cardiotoxic chemotherapy EXAM: NUCLEAR MEDICINE CARDIAC BLOOD POOL IMAGING (MUGA) TECHNIQUE: Cardiac multi-gated acquisition was performed at rest following intravenous injection of Tc-17mlabeled red blood cells.  RADIOPHARMACEUTICALS:  20.8 mCi Tc-953mertechnetate in-vitro labeled red blood cells IV COMPARISON:  None FINDINGS: Calculated LEFT ventricular ejection fraction is 62.4%%, normal. Study was obtained at a cardiac rate of 69 bpm. Patient was rhythmic during imaging. Cine analysis of the LEFT ventricle in 3 projections demonstrates normal LEFT ventricular wall motion. IMPRESSION: Normal LEFT ventricular ejection fraction 62.4%. Normal LV wall motion. Electronically Signed   By: MaElta [place]  Thornton Papas M.D.   On: 08/20/2019 13:22   ASSESSMENT & PLAN:   Carcinoma of upper-outer quadrant of right breast in female, estrogen receptor positive ([place]) #Right breast-invasive mammary carcinoma- S/P LUMPECTOMY pT1a [21m];pN0 [STAGE IA- ER/PRpoS; Her 2 POS]; STAGE I.  Stable.  # Proceed with Taxol-herpcetin today. Labs today reviewed;  acceptable for treatment today. MUGA scan- EF -WNL.  Again reviewed the potential side effects including but not limited to fatigue nausea vomiting tingling numbness; drop in ejection fraction with Herceptin.  We will plan radiation post chemotherapy.  #Slightly elevated AST ALT x 2 times times normal limits-currently normal limits.  #Hyperthyroidism-methimazole-stable  # Clinical trial: [SWOG-1714]-discussed with clinical trials nurse/counseled the patient  # Disposition: # Chemo today #  1 week- labs-cbc/bmp/taxol-hercerptin #  follow up in 2 weeks- MD; labs- cbc/cmp; weekly taxol-Herceptin- Dr.B  Cc: Dr.Cintron  All questions were answered. The patient/family knows to call the clinic with any problems, questions or concerns.    GCammie Sickle MD 09/01/2019 4:49 PM

## 2019-08-28 NOTE — Assessment & Plan Note (Addendum)
#  Right breast-invasive mammary carcinoma- S/P LUMPECTOMY pT1a [25mm];pN0 [STAGE IA- ER/PRpoS; Her 2 POS]; STAGE I.  Stable.  # Proceed with Taxol-herpcetin today. Labs today reviewed;  acceptable for treatment today. MUGA scan- EF -WNL.  Again reviewed the potential side effects including but not limited to fatigue nausea vomiting tingling numbness; drop in ejection fraction with Herceptin.  We will plan radiation post chemotherapy.  #Slightly elevated AST ALT x 2 times times normal limits-currently normal limits.  #Hyperthyroidism-methimazole-stable  # Clinical trial: [SWOG-1714]-discussed with clinical trials nurse/counseled the patient  # Disposition: # Chemo today #  1 week- labs-cbc/bmp/taxol-hercerptin #  follow up in 2 weeks- MD; labs- cbc/cmp; weekly taxol-Herceptin- Dr.B  Cc: Dr.Cintron

## 2019-08-28 NOTE — Research (Signed)
Patient Connie Bradford presents to clinic this morning for her first infusion of Taxol + Herceptin. Patient provided written consent to participate in the SWOG S1714 Neuropathy study. See Research Consent note dated 08/19/2019 for details of consent process and study registration. After consent to participate, patient study visit was conducted. She completed the baseline questionnaires including: PROMIS 29, GSLPTAQ, EORTC QLQ-CIPN20, PRO-CTCAE, Baseline Patient Symptom burden questionnaire, and the FACT/GOG-NTX. Dr. Rogue Bussing was in to perform H&P. He also completed the baseline Physician CTCAE assessment and the baseline Physician Treatment Burden form. Height, weight and VS were assessed prior to MD visit. Patient denies any falls within the past 6 months and reports she is right hand and right foot dominant. Timed Get up and Go completed along with Neuropen Touch & Pressure and Pain & Sharpness assessments. Tuning fork assessment completed with assistance of Jeral Fruit, RN for timed assessments. Patient had pre-treatment labs and Central study labs collected prior to H&P, and discussed that the second part of mandatory labs will be collected towards the end of her Taxol infusion this afternoon, and it cannot be collected from the port, but will have to be drawn from her arm. This was discussed prior to patient providing consent for the study. She elected not to participate in the optional lab studies. After baseline assessments were completed, patient was found to meet all of the eligibility and none of the ineligibility criteria and was registered in the [facility].  Solicited Adverse Event Log  Study/Protocol: SWOG [id number] Cycle: Baseline Event Grade Onset Date Resolved Date Drug Name Attribution Treatment Comments  Dysesthesia 0        Neuralgia 0        Paresthesia 0        Peripheral Motor Neuropathy 0        Peripheral Sensory Neuropathy 0                 [name], BSN, MHA,  OCN 08/28/2019 9:35 AM  Mandatory study labs were collected by [facility] at 3:07 pm; within 10 minutes of patient completing her initial Taxol infusion at 3:15 pm. Patient tolerated procedure well. EDTA tubes immediately placed in ice bath and transported to the lab by this RN. Study labs processed per protocol instructions by Collene Mares and placed in -80 degree freezer. Will batch and ship out the labs on dry ice in approx. 2 weeks. [name], BSN, MHA, OCN 08/28/2019 3:43 PM

## 2019-08-29 ENCOUNTER — Telehealth: Payer: Self-pay | Admitting: *Deleted

## 2019-08-29 NOTE — Telephone Encounter (Signed)
T/C made to patient Connie Bradford to follow up her first chemotherapy infusion and to clarify dosages on her B vitamins and fish oil for the S1714 study. Patient reports she has done surprisingly well following her first Taxol & Herceptin. States she had an episode of nausea last evening that resolved quickly after taking her anti-emetic medication. Also states she had some tingling in her left arm that quickly went away. Reports feeling a little tired, but states she is much better this morning and is working from home. Patient verified the following dosages for her supplements: within her B-complex vitamin -  Vitamin B6 = 5mg ; Vitamin B12 = 126mcg. Within her Omega 3-6-9 supplement there is 1030mg  of omega 3 fish oil. Will report these accordingly. Patient instructed to call if she has any problems or questions and she agrees. [name], BSN, MHA, OCN 08/29/2019 12:12 PM

## 2019-09-04 ENCOUNTER — Other Ambulatory Visit: Payer: Self-pay

## 2019-09-04 ENCOUNTER — Inpatient Hospital Stay: Payer: 59

## 2019-09-04 VITALS — BP 147/77 | HR 79 | Resp 16 | Wt 182.2 lb

## 2019-09-04 DIAGNOSIS — Z5112 Encounter for antineoplastic immunotherapy: Secondary | ICD-10-CM | POA: Diagnosis not present

## 2019-09-04 DIAGNOSIS — C50411 Malignant neoplasm of upper-outer quadrant of right female breast: Secondary | ICD-10-CM

## 2019-09-04 DIAGNOSIS — Z95828 Presence of other vascular implants and grafts: Secondary | ICD-10-CM

## 2019-09-04 DIAGNOSIS — Z170 Estrogen receptor positive status [ER+]: Secondary | ICD-10-CM

## 2019-09-04 LAB — BASIC METABOLIC PANEL
Anion gap: 9 (ref 5–15)
BUN: 14 mg/dL (ref 6–20)
CO2: 23 mmol/L (ref 22–32)
Calcium: 8.7 mg/dL — ABNORMAL LOW (ref 8.9–10.3)
Chloride: 106 mmol/L (ref 98–111)
Creatinine, Ser: 0.85 mg/dL (ref 0.44–1.00)
GFR calc Af Amer: >60 mL/min (ref >60)
GFR calc non Af Amer: >60 mL/min (ref >60)
Glucose, Bld: 189 mg/dL — ABNORMAL HIGH (ref 70–99)
Potassium: 4 mmol/L (ref 3.5–5.1)
Sodium: 138 mmol/L (ref 135–145)

## 2019-09-04 LAB — CBC WITH DIFFERENTIAL/PLATELET
Abs Immature Granulocytes: 0 10*3/uL (ref 0.00–0.07)
Band Neutrophils: 4 %
Basophils Absolute: 0.1 10*3/uL (ref 0.0–0.1)
Basophils Relative: 2 %
Eosinophils Absolute: 0.2 10*3/uL (ref 0.0–0.5)
Eosinophils Relative: 6 %
HCT: 32 % — ABNORMAL LOW (ref 36.0–46.0)
Hemoglobin: 10.5 g/dL — ABNORMAL LOW (ref 12.0–15.0)
Lymphocytes Relative: 24 %
Lymphs Abs: 0.9 10*3/uL (ref 0.7–4.0)
MCH: 28.7 pg (ref 26.0–34.0)
MCHC: 32.8 g/dL (ref 30.0–36.0)
MCV: 87.4 fL (ref 80.0–100.0)
Monocytes Absolute: 0.2 10*3/uL (ref 0.1–1.0)
Monocytes Relative: 6 %
Neutro Abs: 2.4 10*3/uL (ref 1.7–7.7)
Neutrophils Relative %: 58 %
Platelets: 177 10*3/uL (ref 150–400)
RBC: 3.66 MIL/uL — ABNORMAL LOW (ref 3.87–5.11)
RDW: 13.2 % (ref 11.5–15.5)
Smear Review: ADEQUATE
WBC: 3.8 10*3/uL — ABNORMAL LOW (ref 4.0–10.5)
nRBC: 0 % (ref 0.0–0.2)

## 2019-09-04 MED ORDER — SODIUM CHLORIDE 0.9% FLUSH
10.0000 mL | INTRAVENOUS | Status: DC | PRN
  Administered 2019-09-04: 10 mL via INTRAVENOUS
  Filled 2019-09-04: qty 10

## 2019-09-04 MED ORDER — DIPHENHYDRAMINE HCL 50 MG/ML IJ SOLN
25.0000 mg | Freq: Once | INTRAMUSCULAR | Status: AC
  Administered 2019-09-04: 25 mg via INTRAVENOUS
  Filled 2019-09-04: qty 1

## 2019-09-04 MED ORDER — ACETAMINOPHEN 325 MG PO TABS
650.0000 mg | ORAL_TABLET | Freq: Once | ORAL | Status: AC
  Administered 2019-09-04: 650 mg via ORAL
  Filled 2019-09-04: qty 2

## 2019-09-04 MED ORDER — HEPARIN SOD (PORK) LOCK FLUSH 100 UNIT/ML IV SOLN
500.0000 [IU] | Freq: Once | INTRAVENOUS | Status: AC | PRN
  Administered 2019-09-04: 500 [IU]
  Filled 2019-09-04: qty 5

## 2019-09-04 MED ORDER — SODIUM CHLORIDE 0.9 % IV SOLN
Freq: Once | INTRAVENOUS | Status: AC
  Filled 2019-09-04: qty 250

## 2019-09-04 MED ORDER — TRASTUZUMAB-ANNS CHEMO 150 MG IV SOLR
150.0000 mg | Freq: Once | INTRAVENOUS | Status: AC
  Administered 2019-09-04: 150 mg via INTRAVENOUS
  Filled 2019-09-04: qty 7.14

## 2019-09-04 MED ORDER — SODIUM CHLORIDE 0.9 % IV SOLN
20.0000 mg | Freq: Once | INTRAVENOUS | Status: AC
  Administered 2019-09-04: 20 mg via INTRAVENOUS
  Filled 2019-09-04: qty 20

## 2019-09-04 MED ORDER — SODIUM CHLORIDE 0.9 % IV SOLN
80.0000 mg/m2 | Freq: Once | INTRAVENOUS | Status: AC
  Administered 2019-09-04: 156 mg via INTRAVENOUS
  Filled 2019-09-04: qty 26

## 2019-09-04 MED ORDER — FAMOTIDINE IN NACL 20-0.9 MG/50ML-% IV SOLN
20.0000 mg | Freq: Once | INTRAVENOUS | Status: AC
  Administered 2019-09-04: 20 mg via INTRAVENOUS
  Filled 2019-09-04: qty 50

## 2019-09-04 NOTE — Progress Notes (Signed)
Pharmacist Chemotherapy Monitoring - Follow Up Assessment    I verify that I have reviewed each item in the below checklist:  . Regimen for the patient is scheduled for the appropriate day and plan matches scheduled date. Marland Kitchen Appropriate non-routine labs are ordered dependent on drug ordered. . If applicable, additional medications reviewed and ordered per protocol based on lifetime cumulative doses and/or treatment regimen.   Plan for follow-up and/or issues identified: No . I-vent associated with next due treatment: No . MD and/or nursing notified: No  Falyn Rubel K 09/04/2019 9:01 AM

## 2019-09-11 ENCOUNTER — Inpatient Hospital Stay: Payer: 59

## 2019-09-11 ENCOUNTER — Encounter: Payer: Self-pay | Admitting: Internal Medicine

## 2019-09-11 ENCOUNTER — Encounter: Payer: Self-pay | Admitting: *Deleted

## 2019-09-11 ENCOUNTER — Other Ambulatory Visit: Payer: Self-pay

## 2019-09-11 ENCOUNTER — Inpatient Hospital Stay (HOSPITAL_BASED_OUTPATIENT_CLINIC_OR_DEPARTMENT_OTHER): Payer: 59 | Admitting: Internal Medicine

## 2019-09-11 DIAGNOSIS — Z170 Estrogen receptor positive status [ER+]: Secondary | ICD-10-CM | POA: Diagnosis not present

## 2019-09-11 DIAGNOSIS — Z95828 Presence of other vascular implants and grafts: Secondary | ICD-10-CM

## 2019-09-11 DIAGNOSIS — C50411 Malignant neoplasm of upper-outer quadrant of right female breast: Secondary | ICD-10-CM | POA: Diagnosis not present

## 2019-09-11 DIAGNOSIS — Z5112 Encounter for antineoplastic immunotherapy: Secondary | ICD-10-CM | POA: Diagnosis not present

## 2019-09-11 LAB — CBC WITH DIFFERENTIAL/PLATELET
Abs Immature Granulocytes: 0.04 10*3/uL (ref 0.00–0.07)
Basophils Absolute: 0.1 10*3/uL (ref 0.0–0.1)
Basophils Relative: 2 %
Eosinophils Absolute: 0.1 10*3/uL (ref 0.0–0.5)
Eosinophils Relative: 3 %
HCT: 31.3 % — ABNORMAL LOW (ref 36.0–46.0)
Hemoglobin: 10.1 g/dL — ABNORMAL LOW (ref 12.0–15.0)
Immature Granulocytes: 1 %
Lymphocytes Relative: 40 %
Lymphs Abs: 1.2 10*3/uL (ref 0.7–4.0)
MCH: 28.3 pg (ref 26.0–34.0)
MCHC: 32.3 g/dL (ref 30.0–36.0)
MCV: 87.7 fL (ref 80.0–100.0)
Monocytes Absolute: 0.3 10*3/uL (ref 0.1–1.0)
Monocytes Relative: 8 %
Neutro Abs: 1.4 10*3/uL — ABNORMAL LOW (ref 1.7–7.7)
Neutrophils Relative %: 46 %
Platelets: 248 10*3/uL (ref 150–400)
RBC: 3.57 MIL/uL — ABNORMAL LOW (ref 3.87–5.11)
RDW: 14.1 % (ref 11.5–15.5)
WBC: 3.1 10*3/uL — ABNORMAL LOW (ref 4.0–10.5)
nRBC: 0 % (ref 0.0–0.2)

## 2019-09-11 LAB — COMPREHENSIVE METABOLIC PANEL
ALT: 61 U/L — ABNORMAL HIGH (ref 0–44)
AST: 40 U/L (ref 15–41)
Albumin: 4.1 g/dL (ref 3.5–5.0)
Alkaline Phosphatase: 74 U/L (ref 38–126)
Anion gap: 6 (ref 5–15)
BUN: 15 mg/dL (ref 6–20)
CO2: 25 mmol/L (ref 22–32)
Calcium: 8.9 mg/dL (ref 8.9–10.3)
Chloride: 104 mmol/L (ref 98–111)
Creatinine, Ser: 0.84 mg/dL (ref 0.44–1.00)
GFR calc Af Amer: >60 mL/min (ref >60)
GFR calc non Af Amer: >60 mL/min (ref >60)
Glucose, Bld: 148 mg/dL — ABNORMAL HIGH (ref 70–99)
Potassium: 3.9 mmol/L (ref 3.5–5.1)
Sodium: 135 mmol/L (ref 135–145)
Total Bilirubin: 0.6 mg/dL (ref 0.3–1.2)
Total Protein: 6.7 g/dL (ref 6.5–8.1)

## 2019-09-11 MED ORDER — SODIUM CHLORIDE 0.9 % IV SOLN
80.0000 mg/m2 | Freq: Once | INTRAVENOUS | Status: AC
  Administered 2019-09-11: 156 mg via INTRAVENOUS
  Filled 2019-09-11: qty 26

## 2019-09-11 MED ORDER — ACETAMINOPHEN 325 MG PO TABS
650.0000 mg | ORAL_TABLET | Freq: Once | ORAL | Status: AC
  Administered 2019-09-11: 650 mg via ORAL
  Filled 2019-09-11: qty 2

## 2019-09-11 MED ORDER — FAMOTIDINE IN NACL 20-0.9 MG/50ML-% IV SOLN
20.0000 mg | Freq: Once | INTRAVENOUS | Status: AC
  Administered 2019-09-11: 20 mg via INTRAVENOUS
  Filled 2019-09-11: qty 50

## 2019-09-11 MED ORDER — SODIUM CHLORIDE 0.9% FLUSH
10.0000 mL | INTRAVENOUS | Status: DC | PRN
  Administered 2019-09-11: 10 mL via INTRAVENOUS
  Filled 2019-09-11: qty 10

## 2019-09-11 MED ORDER — HEPARIN SOD (PORK) LOCK FLUSH 100 UNIT/ML IV SOLN
500.0000 [IU] | Freq: Once | INTRAVENOUS | Status: AC | PRN
  Administered 2019-09-11: 500 [IU]
  Filled 2019-09-11: qty 5

## 2019-09-11 MED ORDER — TRASTUZUMAB-ANNS CHEMO 150 MG IV SOLR
150.0000 mg | Freq: Once | INTRAVENOUS | Status: AC
  Administered 2019-09-11: 150 mg via INTRAVENOUS
  Filled 2019-09-11: qty 7.14

## 2019-09-11 MED ORDER — DIPHENHYDRAMINE HCL 50 MG/ML IJ SOLN
25.0000 mg | Freq: Once | INTRAMUSCULAR | Status: AC
  Administered 2019-09-11: 25 mg via INTRAVENOUS
  Filled 2019-09-11: qty 1

## 2019-09-11 MED ORDER — SODIUM CHLORIDE 0.9 % IV SOLN
20.0000 mg | Freq: Once | INTRAVENOUS | Status: AC
  Administered 2019-09-11: 20 mg via INTRAVENOUS
  Filled 2019-09-11: qty 20

## 2019-09-11 MED ORDER — HEPARIN SOD (PORK) LOCK FLUSH 100 UNIT/ML IV SOLN
INTRAVENOUS | Status: AC
  Filled 2019-09-11: qty 5

## 2019-09-11 MED ORDER — SODIUM CHLORIDE 0.9 % IV SOLN
Freq: Once | INTRAVENOUS | Status: AC
  Filled 2019-09-11: qty 250

## 2019-09-11 NOTE — Progress Notes (Signed)
Pharmacist Chemotherapy Monitoring - Follow Up Assessment    I verify that I have reviewed each item in the below checklist:  . Regimen for the patient is scheduled for the appropriate day and plan matches scheduled date. Marland Kitchen Appropriate non-routine labs are ordered dependent on drug ordered. . If applicable, additional medications reviewed and ordered per protocol based on lifetime cumulative doses and/or treatment regimen.   Plan for follow-up and/or issues identified: No . I-vent associated with next due treatment: No . MD and/or nursing notified: No  Couper Juncaj K 09/11/2019 10:14 AM

## 2019-09-11 NOTE — Research (Signed)
Met with patient this morning to review NCI/NIH DCP-001 study prior to her treatment. Purpose of consent discussed with patient along with type of information that would be gathered and reported to the [name]. Informed that participation is strictly voluntary and she is not obligated to participate, that she will not be paid for participation, how her information would be kept private, that there are no direct benefits to her for participating and that the only risk might be the breach of her PHI. Also discussed that she can withdraw at any time and for any reason. Copy of the Informed Consent form given to patient to review and she indicated she would reqd over it during treatment and may make a decision later today. Will check back with patient later on for her decision. [name], BSN, MHA, OCN 09/11/2019 9:24 AM  Approached patient Connie Bradford back in infusion and she reports she had read the consent and she is willing to participate. Patient denies having any additional questiona bout the study at this point. She is alone in the clinic and was alone during initial consent conversation. Reminded that participation is voluntary and that she can withdraw at any time and for any reason. Patient provided written consent for the NIH DCP-001 ICF Protocol Version Date: 10/09/18 and NIH DCP-001 SCOR HIPAA form dated 06/09/14. Connie Bradford also completed the DCP-001 worksheet while in clinic and was registered to the study. Patient was given copies of all signed consent forms for her records. She was thanked for participating in [name]. [name], BSN, MHA, OCN 09/11/2019 11:50 AM

## 2019-09-11 NOTE — Progress Notes (Signed)
one Connie Bradford NOTE  Patient Care Team: Connie Sons, MD as PCP - General (Family Medicine) [facility] (Gynecology) Connie Demark, RN as Oncology Nurse Navigator Connie Sickle, MD as Consulting Physician (Hematology and Oncology)  CHIEF COMPLAINTS/PURPOSE OF CONSULTATION: Breast cancer  #  Oncology History Overview Note  # MARCH 2021- RUOQ- 54m- [BREAST, RIGHT, LATERAL POSTERIOR  - INVASIVE MAMMARY CARCINOMA, NO SPECIAL TYPE, ASSOCIATED WITH  CALCIFICATIONS.][place] ER-> 90%; PR- 50-90%; HER 2 FISH- POSITIVE  # S/P LUMPECTOMY [Dr.Cintron] pT1a [428m;pN0 [STAGE IA- ER/PRpoS; Her 2 POS]  . BREAST, RIGHT, LATERAL MIDDLE DEPTH (RIBBON-SHAPED CLIP):  STEREOTACTIC-GUIDED CORE BIOPSY: - DUCTAL CARCINOMA IN SITU (DCIS), HIGH-GRADE, ASSOCIATED WITH  CALCIFICATIONS.   # May 14th,2021- TH-H  #Hypothyroidism-methimazole [Dr.Solum]  # SURVIVORSHIP:   # GENETICS:   DIAGNOSIS: Breast cancer  STAGE:   1      ;  GOALS: Cure  CURRENT/MOST RECENT THERAPY : Taxol Herceptin   Carcinoma of upper-outer quadrant of right breast in female, estrogen receptor positive ([name] 07/15/2019 Initial Diagnosis   Carcinoma of upper-outer quadrant of right breast in female, estrogen receptor positive ([name]  08/28/2019 -  Chemotherapy   The patient had PACLitaxel (TAXOL) 156 mg in sodium chloride 0.9 % 250 mL chemo infusion (</= 8059m2), 80 mg/m2 = 156 mg, Intravenous,  Once, 1 of 3 cycles Administration: 156 mg (08/28/2019), 156 mg (09/04/2019) trastuzumab-anns (KANJINTI) 336 mg in sodium chloride 0.9 % 250 mL chemo infusion, 4 mg/kg = 336 mg (100 % of original dose 4 mg/kg), Intravenous,  Once, 1 of 16 cycles Dose modification: 4 mg/kg (original dose 4 mg/kg, Cycle 1, Reason: Other (see comments), Comment: loading dose, biosimilar per insurance) Administration: 336 mg (08/28/2019), 150 mg (09/04/2019)  for chemotherapy treatment.       HISTORY OF PRESENTING ILLNESS:   Connie Bradford 58 18o.  female with breast cancer early stage-ER/PR positive HER-2/neu positive on adjuvant chemotherapy.  Has intermittent nosebleeds only on blowing.  Otherwise denies any spontaneous bleeding.  Mild diarrhea resolved.  No nausea no vomiting.    Review of Systems  Constitutional: Negative for chills, diaphoresis, fever, malaise/fatigue and weight loss.  HENT: Negative for nosebleeds and sore throat.   Eyes: Negative for double vision.  Respiratory: Negative for cough, hemoptysis, sputum production, shortness of breath and wheezing.   Cardiovascular: Negative for chest pain, palpitations, orthopnea and leg swelling.  Gastrointestinal: Negative for abdominal pain, blood in stool, constipation, diarrhea, heartburn, melena, nausea and vomiting.  Genitourinary: Negative for dysuria, frequency and urgency.  Musculoskeletal: Negative for back pain and joint pain.  Skin: Negative.  Negative for itching and rash.  Neurological: Negative for dizziness, tingling, focal weakness, weakness and headaches.  Endo/Heme/Allergies: Does not bruise/bleed easily.  Psychiatric/Behavioral: Negative for depression. The patient is not nervous/anxious and does not have insomnia.      MEDICAL HISTORY:  Past Medical History:  Diagnosis Date  . Breast cancer ([facility]   [facility] DCIS  . Dyslipidemia   . Family history of bladder cancer   . Graves disease   . Hyperthyroidism   . Inborn lipid storage disorder   . Leiomyoma of uterus   . Vitamin D deficiency     SURGICAL HISTORY: Past Surgical History:  Procedure Laterality Date  . APPENDECTOMY  1982   Cyst removed from ovaries   . BREAST BIOPSY Right 07/05/2019   stereo bx, coil clip, IMC  . BREAST BIOPSY Right 07/05/2019  stereo bx, ribbon clip, DCIS   . BREAST LUMPECTOMY Right 07/31/2019    [facility] and DCIS with SN bx  . COLONOSCOPY WITH PROPOFOL N/A 01/20/2017   Procedure: COLONOSCOPY WITH PROPOFOL;  Surgeon: Connie Bellows, MD;   Location: [facility] ENDOSCOPY;  Service: Gastroenterology;  Laterality: N/A;  . [place]  . NECK SURGERY  2013   LIft from weight loss  . PARTIAL MASTECTOMY WITH NEEDLE LOCALIZATION AND AXILLARY SENTINEL LYMPH NODE BX Right 07/31/2019   Procedure: RIGHT PARTIAL MASTECTOMY WITH BRACKETED NEEDLE LOCALIZATION AND LEFT AXILLARY SENTINEL LYMPH NODE BIOPSY;  Surgeon: Connie Pun, MD;  Location: [facility] ORS;  Service: General;  Laterality: Right;  . PORTACATH PLACEMENT Left 07/31/2019   Procedure: INSERTION PORT-A-CATH LEFT INTERNAL JUGULAR;  Surgeon: Connie Pun, MD;  Location: [facility] ORS;  Service: General;  Laterality: Left;  . TONSILLECTOMY  1969    SOCIAL HISTORY: Social History   Socioeconomic History  . Marital status: Married    Spouse name: Not on file  . Number of children: 0  . Years of education: Connie Bradford  . Highest education level: Not on file  Occupational History  . Occupation: Full-Time    Comment: [employer] x9  Tobacco Use  . Smoking status: Former Research scientist (life sciences)  . Smokeless tobacco: Never Used  Substance and Sexual Activity  . Alcohol use: Yes    Alcohol/week: 0.0 standard drinks    Comment: RARELY  . Drug use: No  . Sexual activity: Yes    Birth control/protection: Post-menopausal  Other Topics Concern  . Not on file  Social History Narrative   Lives close to [place]; with husband. Quit smoking in 2007 [25 years]; ocassional alcohol. 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    Social Determinants of Health   Financial Resource Strain:   . Difficulty of Paying Living Expenses:   Food Insecurity:   . Worried About Charity fundraiser in the Last Year:   . Arboriculturist in the Last Year:   Transportation Needs:   . Film/video editor (Medical):   Marland Kitchen Lack of Transportation (Non-Medical):   Physical Activity:   . Days of Exercise per Week:   . Minutes of Exercise per Session:   Stress:   . Feeling of Stress :   Social  Connections:   . Frequency of Communication with Friends and Family:   . Frequency of Social Gatherings with Friends and Family:   . Attends Religious Services:   . Active Member of Clubs or Organizations:   . Attends Archivist Meetings:   Marland Kitchen Marital Status:   Intimate Partner Violence:   . Fear of Current or Ex-Partner:   . Emotionally Abused:   Marland Kitchen Physically Abused:   . Sexually Abused:     FAMILY HISTORY: Family History  Problem Relation Age of Onset  . Bladder Cancer Mother   . Diabetes Father   . Heart disease Father   . Bladder Cancer Brother   . Breast cancer Neg Hx     ALLERGIES:  is allergic to penicillins.  MEDICATIONS:  Current Outpatient Medications  Medication Sig Dispense Refill  . B COMPLEX VITAMINS PO Take 1 tablet by mouth daily.     . Cholecalciferol (VITAMIN D) 125 MCG (5000 UT) CAPS Take 5,000 Units by mouth daily.     . COLLAGEN PO Take 1 tablet by mouth daily. Super Collagen w/Biotin    . ibuprofen (ADVIL) 200 MG tablet Take 600 mg by mouth  every 8 (eight) hours as needed (for pain.).    Marland Kitchen lidocaine-prilocaine (EMLA) cream Apply 1 application topically as needed. 30 g 0  . methimazole (TAPAZOLE) 5 MG tablet Take 5 mg by mouth daily.     . Multiple Vitamin (MULTIVITAMIN WITH MINERALS) TABS tablet Take 1 tablet by mouth daily.    . Omega 3-6-9 Fatty Acids (OMEGA 3-6-9 COMPLEX) CAPS Take 1 capsule by mouth daily.    . valACYclovir (VALTREX) 500 MG tablet Take 500 mg by mouth 2 (two) times a week.     . ondansetron (ZOFRAN) 8 MG tablet One pill every 8 hours as needed for nausea/vomitting. (Patient not taking: Reported on 09/11/2019) 40 tablet 1  . prochlorperazine (COMPAZINE) 10 MG tablet Take 1 tablet (10 mg total) by mouth every 6 (six) hours as needed for nausea or vomiting. (Patient not taking: Reported on 09/11/2019) 40 tablet 1   No current facility-administered medications for this visit.   Facility-Administered Medications Ordered in Other  Visits  Medication Dose Route Frequency Provider Last Rate Last Admin  . dexamethasone (DECADRON) 20 mg in sodium chloride 0.9 % 50 mL IVPB  20 mg Intravenous Once [name] R, MD      . famotidine (PEPCID) IVPB 20 mg premix  20 mg Intravenous Once [name] R, MD 200 mL/hr at 09/11/19 0953 20 mg at 09/11/19 0953  . heparin lock flush 100 unit/mL  500 Units Intracatheter Once PRN Connie Sickle, MD      . PACLitaxel (TAXOL) 156 mg in sodium chloride 0.9 % 250 mL chemo infusion (</= 61m/m2)  80 mg/m2 (Treatment Plan Recorded) Intravenous Once BCammie Sickle MD      . tTheotis Burrow([facility] 150 mg in sodium chloride 0.9 % 250 mL chemo infusion  150 mg Intravenous Once BCammie Sickle MD        PHYSICAL EXAMINATION: ECOG PERFORMANCE STATUS: 0 - Asymptomatic  Vitals:   09/11/19 0907  BP: 121/69  Pulse: 69  Resp: 18  Temp: 98.7 F (37.1 C)   Filed Weights   09/11/19 0907  Weight: 181 lb 6.4 oz (82.3 kg)    Physical Exam  Constitutional: She is oriented to person, place, and time and well-developed, well-nourished, and in no distress.  HENT:  Head: Normocephalic and atraumatic.  Mouth/Throat: Oropharynx is clear and moist. No oropharyngeal exudate.  Eyes: Pupils are equal, round, and reactive to light.  Cardiovascular: Normal rate and regular rhythm.  Pulmonary/Chest: Effort normal and breath sounds normal. No respiratory distress. She has no wheezes.  Abdominal: Soft. Bowel sounds are normal. She exhibits no distension and no mass. There is no abdominal tenderness. There is no rebound and no guarding.  Musculoskeletal:        General: No tenderness or edema. Normal range of motion.     Cervical back: Normal range of motion and neck supple.  Neurological: She is alert and oriented to person, place, and time.  Skin: Skin is warm.  Psychiatric: Affect normal.   LABORATORY DATA:  I have reviewed the data as listed Lab Results   Component Value Date   WBC 3.1 (L) 09/11/2019   HGB 10.1 (L) 09/11/2019   HCT 31.3 (L) 09/11/2019   MCV 87.7 09/11/2019   PLT 248 09/11/2019   Recent Labs    07/15/19 1205 07/15/19 1205 08/28/19 0818 09/04/19 0752 09/11/19 0840  NA 137   < > 139 138 135  K 4.4   < > 3.9 4.0 3.9  CL 103   < > 105 106 104  CO2 25   < > _0 GLUCOSE 99   < > 173* 189* 148*  BUN 18   < > _1 CREATININE 0.85   < > 0.88 0.85 0.84  CALCIUM 9.9   < > 9.2 8.7* 8.9  GFRNONAA >60   < > >60 >60 >60  GFRAA >60   < > >60 >60 >60  PROT 8.1  --  6.9  --  6.7  ALBUMIN 4.7  --  4.0  --  4.1  AST 87*  --  36  --  40  ALT 106*  --  37  --  61*  ALKPHOS 90  --  74  --  74  BILITOT 0.7  --  0.9  --  0.6   < > = values in this interval not displayed.    RADIOGRAPHIC STUDIES: I have personally reviewed the radiological images as listed and agreed with the findings in the report. NM Cardiac M[place] Rest  Result Date: 08/20/2019 CLINICAL DATA:  Breast cancer at upper outer quadrant RIGHT breast, pre cardiotoxic chemotherapy EXAM: NUCLEAR MEDICINE CARDIAC BLOOD POOL IMAGING (MUGA) TECHNIQUE: Cardiac multi-gated acquisition was performed at rest following intravenous injection of Tc-35mlabeled red blood cells. RADIOPHARMACEUTICALS:  20.8 mCi Tc-975mertechnetate in-vitro labeled red blood cells IV COMPARISON:  None FINDINGS: Calculated LEFT ventricular ejection fraction is 62.4%%, normal. Study was obtained at a cardiac rate of 69 bpm. Patient was rhythmic during imaging. Cine analysis of the LEFT ventricle in 3 projections demonstrates normal LEFT ventricular wall motion. IMPRESSION: Normal LEFT ventricular ejection fraction 62.4%. Normal LV wall motion. Electronically Signed   By: MaLavonia Dana.D.   On: 08/20/2019 13:22   ASSESSMENT & PLAN:   Carcinoma of upper-outer quadrant of right breast in female, estrogen receptor positive ([place]#Right breast-invasive mammary carcinoma- S/P LUMPECTOMY pT1a [68m63mpN0  [STAGE IA- ER/PRpoS; Her 2 POS]; STAGE I. STABLE- on adjuvant Taxol-Herceptin.  # Proceed with cycle #1-day-15 today. Labs today reviewed;  acceptable for treatment today.   #Slightly elevated AST ALT x 2 times times normal limits-currently normal limits.  Stable.  #Hyperthyroidism-methimazole-stable  # [place] should be able to accommodate.   # Disposition:  # Chemo today #  1 week- labs-cbc/bmp/taxol-hercerptin #  follow up in 2 weeks- MD; labs- cbc/cmp; weekly taxol-Herceptin- Dr.B  Cc: Dr.Cintron  All questions were answered. The patient/family knows to call the clinic with any problems, questions or concerns.    GovCammie SickleD 09/11/2019 9:53 AM

## 2019-09-11 NOTE — Progress Notes (Signed)
Pt in for follow up, reports some nose bleeds after blowing nose but able to get stopped.

## 2019-09-11 NOTE — Assessment & Plan Note (Addendum)
#  Right breast-invasive mammary carcinoma- S/P LUMPECTOMY pT1a [21mm];pN0 [STAGE IA- ER/PRpoS; Her 2 POS]; STAGE I. STABLE- on adjuvant Taxol-Herceptin.  # Proceed with cycle #1-day-15 today. Labs today reviewed;  acceptable for treatment today.   #Slightly elevated AST ALT x 2 times times normal limits-currently normal limits.  Stable.  #Hyperthyroidism-methimazole-stable  # Vacation in in [place] over the schedule; should be able to accommodate.   # Disposition:  # Chemo today #  1 week- labs-cbc/bmp/taxol-hercerptin #  follow up in 2 weeks- MD; labs- cbc/cmp; weekly taxol-Herceptin- Dr.B  Cc: Dr.Cintron

## 2019-09-17 ENCOUNTER — Other Ambulatory Visit: Payer: Self-pay | Admitting: Obstetrics & Gynecology

## 2019-09-17 DIAGNOSIS — R928 Other abnormal and inconclusive findings on diagnostic imaging of breast: Secondary | ICD-10-CM

## 2019-09-18 ENCOUNTER — Other Ambulatory Visit: Payer: Self-pay

## 2019-09-18 ENCOUNTER — Inpatient Hospital Stay: Payer: 59 | Attending: Internal Medicine

## 2019-09-18 ENCOUNTER — Inpatient Hospital Stay: Payer: 59

## 2019-09-18 VITALS — BP 132/75 | HR 71 | Temp 97.8°F | Resp 18 | Wt 179.8 lb

## 2019-09-18 DIAGNOSIS — Z006 Encounter for examination for normal comparison and control in clinical research program: Secondary | ICD-10-CM | POA: Insufficient documentation

## 2019-09-18 DIAGNOSIS — Z5112 Encounter for antineoplastic immunotherapy: Secondary | ICD-10-CM | POA: Insufficient documentation

## 2019-09-18 DIAGNOSIS — R197 Diarrhea, unspecified: Secondary | ICD-10-CM | POA: Diagnosis not present

## 2019-09-18 DIAGNOSIS — E0500 Thyrotoxicosis with diffuse goiter without thyrotoxic crisis or storm: Secondary | ICD-10-CM | POA: Diagnosis not present

## 2019-09-18 DIAGNOSIS — Z79899 Other long term (current) drug therapy: Secondary | ICD-10-CM | POA: Insufficient documentation

## 2019-09-18 DIAGNOSIS — Z5111 Encounter for antineoplastic chemotherapy: Secondary | ICD-10-CM | POA: Insufficient documentation

## 2019-09-18 DIAGNOSIS — Z170 Estrogen receptor positive status [ER+]: Secondary | ICD-10-CM | POA: Diagnosis not present

## 2019-09-18 DIAGNOSIS — R7401 Elevation of levels of liver transaminase levels: Secondary | ICD-10-CM | POA: Diagnosis not present

## 2019-09-18 DIAGNOSIS — C50411 Malignant neoplasm of upper-outer quadrant of right female breast: Secondary | ICD-10-CM | POA: Diagnosis not present

## 2019-09-18 DIAGNOSIS — Z95828 Presence of other vascular implants and grafts: Secondary | ICD-10-CM

## 2019-09-18 LAB — BASIC METABOLIC PANEL
Anion gap: 8 (ref 5–15)
BUN: 18 mg/dL (ref 6–20)
CO2: 24 mmol/L (ref 22–32)
Calcium: 9.1 mg/dL (ref 8.9–10.3)
Chloride: 107 mmol/L (ref 98–111)
Creatinine, Ser: 0.85 mg/dL (ref 0.44–1.00)
GFR calc Af Amer: >60 mL/min (ref >60)
GFR calc non Af Amer: >60 mL/min (ref >60)
Glucose, Bld: 141 mg/dL — ABNORMAL HIGH (ref 70–99)
Potassium: 4 mmol/L (ref 3.5–5.1)
Sodium: 139 mmol/L (ref 135–145)

## 2019-09-18 LAB — CBC WITH DIFFERENTIAL/PLATELET
Abs Immature Granulocytes: 0.05 10*3/uL (ref 0.00–0.07)
Basophils Absolute: 0.1 10*3/uL (ref 0.0–0.1)
Basophils Relative: 2 %
Eosinophils Absolute: 0.1 10*3/uL (ref 0.0–0.5)
Eosinophils Relative: 2 %
HCT: 31.5 % — ABNORMAL LOW (ref 36.0–46.0)
Hemoglobin: 10.3 g/dL — ABNORMAL LOW (ref 12.0–15.0)
Immature Granulocytes: 2 %
Lymphocytes Relative: 37 %
Lymphs Abs: 1.1 10*3/uL (ref 0.7–4.0)
MCH: 28.7 pg (ref 26.0–34.0)
MCHC: 32.7 g/dL (ref 30.0–36.0)
MCV: 87.7 fL (ref 80.0–100.0)
Monocytes Absolute: 0.3 10*3/uL (ref 0.1–1.0)
Monocytes Relative: 10 %
Neutro Abs: 1.5 10*3/uL — ABNORMAL LOW (ref 1.7–7.7)
Neutrophils Relative %: 47 %
Platelets: 270 10*3/uL (ref 150–400)
RBC: 3.59 MIL/uL — ABNORMAL LOW (ref 3.87–5.11)
RDW: 14.6 % (ref 11.5–15.5)
WBC: 3 10*3/uL — ABNORMAL LOW (ref 4.0–10.5)
nRBC: 0 % (ref 0.0–0.2)

## 2019-09-18 MED ORDER — SODIUM CHLORIDE 0.9 % IV SOLN
80.0000 mg/m2 | Freq: Once | INTRAVENOUS | Status: AC
  Administered 2019-09-18: 156 mg via INTRAVENOUS
  Filled 2019-09-18: qty 26

## 2019-09-18 MED ORDER — DIPHENHYDRAMINE HCL 50 MG/ML IJ SOLN
25.0000 mg | Freq: Once | INTRAMUSCULAR | Status: AC
  Administered 2019-09-18: 25 mg via INTRAVENOUS
  Filled 2019-09-18: qty 1

## 2019-09-18 MED ORDER — HEPARIN SOD (PORK) LOCK FLUSH 100 UNIT/ML IV SOLN
500.0000 [IU] | Freq: Once | INTRAVENOUS | Status: AC
  Administered 2019-09-18: 500 [IU] via INTRAVENOUS
  Filled 2019-09-18: qty 5

## 2019-09-18 MED ORDER — SODIUM CHLORIDE 0.9 % IV SOLN
20.0000 mg | Freq: Once | INTRAVENOUS | Status: AC
  Administered 2019-09-18: 20 mg via INTRAVENOUS
  Filled 2019-09-18: qty 20

## 2019-09-18 MED ORDER — SODIUM CHLORIDE 0.9 % IV SOLN
Freq: Once | INTRAVENOUS | Status: AC
  Filled 2019-09-18: qty 250

## 2019-09-18 MED ORDER — SODIUM CHLORIDE 0.9% FLUSH
10.0000 mL | INTRAVENOUS | Status: DC | PRN
  Administered 2019-09-18: 10 mL via INTRAVENOUS
  Filled 2019-09-18: qty 10

## 2019-09-18 MED ORDER — TRASTUZUMAB-ANNS CHEMO 150 MG IV SOLR
150.0000 mg | Freq: Once | INTRAVENOUS | Status: AC
  Administered 2019-09-18: 150 mg via INTRAVENOUS
  Filled 2019-09-18: qty 7.14

## 2019-09-18 MED ORDER — ACETAMINOPHEN 325 MG PO TABS
650.0000 mg | ORAL_TABLET | Freq: Once | ORAL | Status: AC
  Administered 2019-09-18: 650 mg via ORAL
  Filled 2019-09-18: qty 2

## 2019-09-18 MED ORDER — FAMOTIDINE IN NACL 20-0.9 MG/50ML-% IV SOLN
20.0000 mg | Freq: Once | INTRAVENOUS | Status: AC
  Administered 2019-09-18: 20 mg via INTRAVENOUS
  Filled 2019-09-18: qty 50

## 2019-09-18 NOTE — Progress Notes (Signed)
Pharmacist Chemotherapy Monitoring - Follow Up Assessment    I verify that I have reviewed each item in the below checklist:  . Regimen for the patient is scheduled for the appropriate day and plan matches scheduled date. Marland Kitchen Appropriate non-routine labs are ordered dependent on drug ordered. . If applicable, additional medications reviewed and ordered per protocol based on lifetime cumulative doses and/or treatment regimen.   Plan for follow-up and/or issues identified: No . I-vent associated with next due treatment: No . MD and/or nursing notified: No  Connie Bradford K 09/18/2019 8:34 AM

## 2019-09-24 ENCOUNTER — Encounter: Payer: Self-pay | Admitting: Internal Medicine

## 2019-09-24 NOTE — Progress Notes (Signed)
Patient called for pre assessment. She denies any pain at this time. States she has been a little sick on stomach since the weekend. Has been taking imodium to relieve symptoms. Reports decreased appetite due to upset stomach and nausea. No other concerns reported.

## 2019-09-25 ENCOUNTER — Encounter: Payer: Self-pay | Admitting: Internal Medicine

## 2019-09-25 ENCOUNTER — Encounter: Payer: Self-pay | Admitting: *Deleted

## 2019-09-25 ENCOUNTER — Inpatient Hospital Stay: Payer: 59

## 2019-09-25 ENCOUNTER — Other Ambulatory Visit: Payer: Self-pay

## 2019-09-25 ENCOUNTER — Inpatient Hospital Stay (HOSPITAL_BASED_OUTPATIENT_CLINIC_OR_DEPARTMENT_OTHER): Payer: 59 | Admitting: Internal Medicine

## 2019-09-25 VITALS — BP 132/71 | HR 77 | Temp 98.6°F | Resp 16

## 2019-09-25 DIAGNOSIS — E0500 Thyrotoxicosis with diffuse goiter without thyrotoxic crisis or storm: Secondary | ICD-10-CM | POA: Diagnosis not present

## 2019-09-25 DIAGNOSIS — Z006 Encounter for examination for normal comparison and control in clinical research program: Secondary | ICD-10-CM

## 2019-09-25 DIAGNOSIS — Z79899 Other long term (current) drug therapy: Secondary | ICD-10-CM

## 2019-09-25 DIAGNOSIS — R7401 Elevation of levels of liver transaminase levels: Secondary | ICD-10-CM

## 2019-09-25 DIAGNOSIS — R197 Diarrhea, unspecified: Secondary | ICD-10-CM

## 2019-09-25 DIAGNOSIS — C50411 Malignant neoplasm of upper-outer quadrant of right female breast: Secondary | ICD-10-CM

## 2019-09-25 DIAGNOSIS — Z170 Estrogen receptor positive status [ER+]: Secondary | ICD-10-CM

## 2019-09-25 DIAGNOSIS — Z5112 Encounter for antineoplastic immunotherapy: Secondary | ICD-10-CM | POA: Diagnosis not present

## 2019-09-25 LAB — CBC WITH DIFFERENTIAL/PLATELET
Abs Immature Granulocytes: 0.17 10*3/uL — ABNORMAL HIGH (ref 0.00–0.07)
Band Neutrophils: 4 %
Basophils Absolute: 0.2 10*3/uL — ABNORMAL HIGH (ref 0.0–0.1)
Basophils Relative: 5 %
Blasts: 0 %
Eosinophils Absolute: 0.1 10*3/uL (ref 0.0–0.5)
Eosinophils Relative: 3 %
HCT: 30.8 % — ABNORMAL LOW (ref 36.0–46.0)
Hemoglobin: 10 g/dL — ABNORMAL LOW (ref 12.0–15.0)
Lymphocytes Relative: 29 %
Lymphs Abs: 1 10*3/uL (ref 0.7–4.0)
MCH: 28.9 pg (ref 26.0–34.0)
MCHC: 32.5 g/dL (ref 30.0–36.0)
MCV: 89 fL (ref 80.0–100.0)
Metamyelocytes Relative: 2 %
Monocytes Absolute: 0.2 10*3/uL (ref 0.1–1.0)
Monocytes Relative: 5 %
Myelocytes: 3 %
Neutro Abs: 1.6 10*3/uL — ABNORMAL LOW (ref 1.7–7.7)
Neutrophils Relative %: 49 %
Other: 0 %
Platelets: 270 10*3/uL (ref 150–400)
Promyelocytes Relative: 0 %
RBC: 3.46 MIL/uL — ABNORMAL LOW (ref 3.87–5.11)
RDW: 15 % (ref 11.5–15.5)
Smear Review: ADEQUATE
WBC: 3.3 10*3/uL — ABNORMAL LOW (ref 4.0–10.5)
nRBC: 0 % (ref 0.0–0.2)
nRBC: 0 /100 WBC

## 2019-09-25 LAB — COMPREHENSIVE METABOLIC PANEL
ALT: 50 U/L — ABNORMAL HIGH (ref 0–44)
AST: 33 U/L (ref 15–41)
Albumin: 4 g/dL (ref 3.5–5.0)
Alkaline Phosphatase: 70 U/L (ref 38–126)
Anion gap: 10 (ref 5–15)
BUN: 21 mg/dL — ABNORMAL HIGH (ref 6–20)
CO2: 24 mmol/L (ref 22–32)
Calcium: 9.4 mg/dL (ref 8.9–10.3)
Chloride: 106 mmol/L (ref 98–111)
Creatinine, Ser: 0.79 mg/dL (ref 0.44–1.00)
GFR calc Af Amer: >60 mL/min (ref >60)
GFR calc non Af Amer: >60 mL/min (ref >60)
Glucose, Bld: 107 mg/dL — ABNORMAL HIGH (ref 70–99)
Potassium: 4 mmol/L (ref 3.5–5.1)
Sodium: 140 mmol/L (ref 135–145)
Total Bilirubin: 0.5 mg/dL (ref 0.3–1.2)
Total Protein: 6.6 g/dL (ref 6.5–8.1)

## 2019-09-25 MED ORDER — SODIUM CHLORIDE 0.9 % IV SOLN
80.0000 mg/m2 | Freq: Once | INTRAVENOUS | Status: AC
  Administered 2019-09-25: 156 mg via INTRAVENOUS
  Filled 2019-09-25: qty 26

## 2019-09-25 MED ORDER — HEPARIN SOD (PORK) LOCK FLUSH 100 UNIT/ML IV SOLN
500.0000 [IU] | Freq: Once | INTRAVENOUS | Status: AC
  Administered 2019-09-25: 500 [IU] via INTRAVENOUS
  Filled 2019-09-25: qty 5

## 2019-09-25 MED ORDER — ACETAMINOPHEN 325 MG PO TABS
650.0000 mg | ORAL_TABLET | Freq: Once | ORAL | Status: AC
  Administered 2019-09-25: 650 mg via ORAL
  Filled 2019-09-25: qty 2

## 2019-09-25 MED ORDER — FAMOTIDINE IN NACL 20-0.9 MG/50ML-% IV SOLN
20.0000 mg | Freq: Once | INTRAVENOUS | Status: AC
  Administered 2019-09-25: 20 mg via INTRAVENOUS
  Filled 2019-09-25: qty 50

## 2019-09-25 MED ORDER — SODIUM CHLORIDE 0.9 % IV SOLN
Freq: Once | INTRAVENOUS | Status: AC
  Filled 2019-09-25: qty 250

## 2019-09-25 MED ORDER — SODIUM CHLORIDE 0.9 % IV SOLN
20.0000 mg | Freq: Once | INTRAVENOUS | Status: AC
  Administered 2019-09-25: 20 mg via INTRAVENOUS
  Filled 2019-09-25: qty 20

## 2019-09-25 MED ORDER — SODIUM CHLORIDE 0.9% FLUSH
10.0000 mL | Freq: Once | INTRAVENOUS | Status: AC
  Administered 2019-09-25: 10 mL via INTRAVENOUS
  Filled 2019-09-25: qty 10

## 2019-09-25 MED ORDER — HEPARIN SOD (PORK) LOCK FLUSH 100 UNIT/ML IV SOLN
INTRAVENOUS | Status: AC
  Filled 2019-09-25: qty 5

## 2019-09-25 MED ORDER — HEPARIN SOD (PORK) LOCK FLUSH 100 UNIT/ML IV SOLN
500.0000 [IU] | Freq: Once | INTRAVENOUS | Status: DC | PRN
  Filled 2019-09-25: qty 5

## 2019-09-25 MED ORDER — SODIUM CHLORIDE 0.9% FLUSH
10.0000 mL | INTRAVENOUS | Status: DC | PRN
  Filled 2019-09-25: qty 10

## 2019-09-25 MED ORDER — DIPHENHYDRAMINE HCL 50 MG/ML IJ SOLN
25.0000 mg | Freq: Once | INTRAMUSCULAR | Status: AC
  Administered 2019-09-25: 25 mg via INTRAVENOUS
  Filled 2019-09-25: qty 1

## 2019-09-25 MED ORDER — TRASTUZUMAB-ANNS CHEMO 150 MG IV SOLR
150.0000 mg | Freq: Once | INTRAVENOUS | Status: AC
  Administered 2019-09-25: 150 mg via INTRAVENOUS
  Filled 2019-09-25: qty 7.14

## 2019-09-25 NOTE — Progress Notes (Signed)
[facility] [place] NOTE  Patient Care Team: Birdie Sons, MD as PCP - General (Family Medicine) [facility] Gyn (Gynecology) Theodore Demark, RN as Oncology Nurse Navigator Cammie Sickle, MD as Consulting Physician (Hematology and Oncology)  CHIEF COMPLAINTS/PURPOSE OF CONSULTATION: Breast cancer  #  Oncology History Overview Note  # MARCH 2021- RUOQ- 73m- [BREAST, RIGHT, LATERAL POSTERIOR  - INVASIVE MAMMARY CARCINOMA, NO SPECIAL TYPE, ASSOCIATED WITH  CALCIFICATIONS.][place] ER-> 90%; PR- 50-90%; HER 2 FISH- POSITIVE  # S/P LUMPECTOMY [Dr.Cintron] pT1a [432m;pN0 [STAGE IA- ER/PRpoS; Her 2 POS]  . BREAST, RIGHT, LATERAL MIDDLE DEPTH (RIBBON-SHAPED CLIP):  STEREOTACTIC-GUIDED CORE BIOPSY: - DUCTAL CARCINOMA IN SITU (DCIS), HIGH-GRADE, ASSOCIATED WITH  CALCIFICATIONS.   # May 14th,2021- TH-H  #Hypothyroidism-methimazole [Dr.Solum]  # SURVIVORSHIP:   # GENETICS:   DIAGNOSIS: Breast cancer  STAGE:   1      ;  GOALS: Cure  CURRENT/MOST RECENT THERAPY : Taxol Herceptin   Carcinoma of upper-outer quadrant of right breast in [place], estrogen receptor positive ([place] 07/[place] Initial Diagnosis   Carcinoma of upper-outer quadrant of right breast in female, estrogen receptor positive ([place]  08/28/2019 -  Chemotherapy   The patient had PACLitaxel (TAXOL) 156 mg in sodium chloride 0.9 % 250 mL chemo infusion (</= 8036m2), 80 mg/m2 = 156 mg, Intravenous,  Once, 2 of 3 cycles Administration: 156 mg (08/28/2019), 156 mg (09/04/2019), 156 mg (09/25/2019), 156 mg (09/11/2019), 156 mg (09/18/2019) trastuzumab-anns (KANJINTI) 336 mg in sodium chloride 0.9 % 250 mL chemo infusion, 4 mg/kg = 336 mg (100 % of original dose 4 mg/kg), Intravenous,  Once, 2 of 16 cycles Dose modification: 4 mg/kg (original dose 4 mg/kg, Cycle 1, Reason: Other (see comments), Comment: loading dose, biosimilar per insurance) Administration: 336 mg (08/28/2019), 150 mg (09/04/2019), 150 mg (09/25/2019),  150 mg (09/11/2019), 150 mg (09/18/2019)  for chemotherapy treatment.      HISTORY OF PRESENTING ILLNESS:  Connie Bradford [age]o.  female with breast cancer early stage-ER/PR positive HER-2/neu positive on adjuvant chemotherapy.  Patient denies any significant nausea or vomiting.  Complains of mild diarrhea.  Denies abdominal cramping.  No significant tingling or numbness extremities.  No myalgias.    Review of Systems  Constitutional: Negative for chills, diaphoresis, fever, malaise/fatigue and weight loss.  HENT: Negative for nosebleeds and sore throat.   Eyes: Negative for double vision.  Respiratory: Negative for cough, hemoptysis, sputum production, shortness of breath and wheezing.   Cardiovascular: Negative for chest pain, palpitations, orthopnea and leg swelling.  Gastrointestinal: Negative for abdominal pain, blood in stool, constipation, diarrhea, heartburn, melena, nausea and vomiting.  Genitourinary: Negative for dysuria, frequency and urgency.  Musculoskeletal: Negative for back pain and joint pain.  Skin: Negative.  Negative for itching and rash.  Neurological: Negative for dizziness, tingling, focal weakness, weakness and headaches.  Endo/Heme/Allergies: Does not bruise/bleed easily.  Psychiatric/Behavioral: Negative for depression. The patient is not nervous/anxious and does not have insomnia.      MEDICAL HISTORY:  Past Medical History:  Diagnosis Date  . Breast cancer ([place]   [place] DCIS  . Dyslipidemia   . Family history of bladder cancer   . Graves disease   . Hyperthyroidism   . Inborn lipid storage disorder   . Leiomyoma of uterus   . Vitamin D deficiency     SURGICAL HISTORY: Past Surgical History:  Procedure Laterality Date  . APPENDECTOMY  1982   Cyst removed from ovaries   .  BREAST BIOPSY Right 07/05/2019   stereo bx, coil clip, IMC  . BREAST BIOPSY Right 07/05/2019   stereo bx, ribbon clip, DCIS   . BREAST LUMPECTOMY Right 07/31/2019     [facility] and DCIS with SN bx  . COLONOSCOPY WITH PROPOFOL N/A 01/20/2017   Procedure: COLONOSCOPY WITH PROPOFOL;  Surgeon: Jonathon Bellows, MD;  Location: [facility] ENDOSCOPY;  Service: Gastroenterology;  Laterality: N/A;  . [place]  . NECK SURGERY  2013   LIft from weight loss  . PARTIAL MASTECTOMY WITH NEEDLE LOCALIZATION AND AXILLARY SENTINEL LYMPH NODE BX Right 07/31/2019   Procedure: RIGHT PARTIAL MASTECTOMY WITH BRACKETED NEEDLE LOCALIZATION AND LEFT AXILLARY SENTINEL LYMPH NODE BIOPSY;  Surgeon: Herbert Pun, MD;  Location: [facility] ORS;  Service: General;  Laterality: Right;  . PORTACATH PLACEMENT Left 07/31/2019   Procedure: INSERTION PORT-A-CATH LEFT INTERNAL JUGULAR;  Surgeon: Herbert Pun, MD;  Location: [facility] ORS;  Service: General;  Laterality: Left;  . TONSILLECTOMY  1969    SOCIAL HISTORY: Social History   Socioeconomic History  . Marital status: Married    Spouse name: Not on file  . Number of children: 0  . Years of education: Anselm Lis  . Highest education level: Not on file  Occupational History  . Occupation: Full-Time    Comment: [employer] x9  Tobacco Use  . Smoking status: Former Research scientist (life sciences)  . Smokeless tobacco: Never Used  Vaping Use  . Vaping Use: Never used  Substance and Sexual Activity  . Alcohol use: Yes    Alcohol/week: 0.0 standard drinks    Comment: RARELY  . Drug use: No  . Sexual activity: Yes    Birth control/protection: Post-menopausal  Other Topics Concern  . Not on file  Social History Narrative   Lives close to [place]; with husband. Quit smoking in 2007 [25 years]; ocassional alcohol. 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    Social Determinants of Health   Financial Resource Strain:   . Difficulty of Paying Living Expenses:   Food Insecurity:   . Worried About Charity fundraiser in the Last Year:   . Arboriculturist in the Last Year:   Transportation Needs:   . Film/video editor (Medical):   Marland Kitchen  Lack of Transportation (Non-Medical):   Physical Activity:   . Days of Exercise per Week:   . Minutes of Exercise per Session:   Stress:   . Feeling of Stress :   Social Connections:   . Frequency of Communication with Friends and Family:   . Frequency of Social Gatherings with Friends and Family:   . Attends Religious Services:   . Active Member of Clubs or Organizations:   . Attends Archivist Meetings:   Marland Kitchen Marital Status:   Intimate Partner Violence:   . Fear of Current or Ex-Partner:   . Emotionally Abused:   Marland Kitchen Physically Abused:   . Sexually Abused:     FAMILY HISTORY: Family History  Problem Relation Age of Onset  . Bladder Cancer Mother   . Diabetes Father   . Heart disease Father   . Bladder Cancer Brother   . Breast cancer Neg Hx     ALLERGIES:  is allergic to penicillins.  MEDICATIONS:  Current Outpatient Medications  Medication Sig Dispense Refill  . B COMPLEX VITAMINS PO Take 1 tablet by mouth daily.     . Cholecalciferol (VITAMIN D) 125 MCG (5000 UT) CAPS Take 5,000 Units by mouth daily.     Marland Kitchen  COLLAGEN PO Take 1 tablet by mouth daily. Super Collagen w/Biotin    . ibuprofen (ADVIL) 200 MG tablet Take 600 mg by mouth every 8 (eight) hours as needed (for pain.).    Marland Kitchen lidocaine-prilocaine (EMLA) cream Apply 1 application topically as needed. 30 g 0  . methimazole (TAPAZOLE) 5 MG tablet Take 5 mg by mouth daily.     . Multiple Vitamin (MULTIVITAMIN WITH MINERALS) TABS tablet Take 1 tablet by mouth daily.    . Omega 3-6-9 Fatty Acids (OMEGA 3-6-9 COMPLEX) CAPS Take 1 capsule by mouth daily.    . ondansetron (ZOFRAN) 8 MG tablet One pill every 8 hours as needed for nausea/vomitting. 40 tablet 1  . prochlorperazine (COMPAZINE) 10 MG tablet Take 1 tablet (10 mg total) by mouth every 6 (six) hours as needed for nausea or vomiting. 40 tablet 1  . valACYclovir (VALTREX) 500 MG tablet Take 500 mg by mouth 2 (two) times a week.      No current  facility-administered medications for this visit.    PHYSICAL EXAMINATION: ECOG PERFORMANCE STATUS: 0 - Asymptomatic  Vitals:   09/24/19 1558 09/25/19 0923  BP: 136/68 136/68  Pulse: 82 82  Temp: 97.7 F (36.5 C) 97.7 F (36.5 C)   Filed Weights   09/24/19 1558 09/25/19 0923  Weight: 179 lb (81.2 kg) 179 lb (81.2 kg)    Physical Exam  Constitutional: She is oriented to person, place, and time and well-developed, well-nourished, and in no distress.  HENT:  Head: Normocephalic and atraumatic.  Mouth/Throat: Oropharynx is clear and moist. No oropharyngeal exudate.  Eyes: Pupils are equal, round, and reactive to light.  Cardiovascular: Normal rate and regular rhythm.  Pulmonary/Chest: Effort normal and breath sounds normal. No respiratory distress. She has no wheezes.  Abdominal: Soft. Bowel sounds are normal. She exhibits no distension and no mass. There is no abdominal tenderness. There is no rebound and no guarding.  Musculoskeletal:        General: No tenderness or edema. Normal range of motion.     Cervical back: Normal range of motion and neck supple.  Neurological: She is alert and oriented to person, place, and time.  Skin: Skin is warm.  Psychiatric: Affect normal.   LABORATORY DATA:  I have reviewed the data as listed Lab Results  Component Value Date   WBC 3.3 (L) 09/25/2019   HGB 10.0 (L) 09/25/2019   HCT 30.8 (L) 09/25/2019   MCV 89.0 09/25/2019   PLT 270 09/25/2019   Recent Labs    08/28/19 0818 09/04/19 0752 09/11/19 0840 09/18/19 0905 09/25/19 0809  NA 139   < > 135 139 140  K 3.9   < > 3.9 4.0 4.0  CL 105   < > 104 107 106  CO2 24   < > '25 24 24  ' GLUCOSE 173*   < > 148* 141* 107*  BUN 15   < > 15 18 21*  CREATININE 0.88   < > 0.84 0.85 0.79  CALCIUM 9.2   < > 8.9 9.1 9.4  GFRNONAA >60   < > >60 >60 >60  GFRAA >60   < > >60 >60 >60  PROT 6.9  --  6.7  --  6.6  ALBUMIN 4.0  --  4.1  --  4.0  AST 36  --  40  --  33  ALT 37  --  61*  --  50*   ALKPHOS 74  --  74  --  70  BILITOT 0.9  --  0.6  --  0.5   < > = values in this interval not displayed.    RADIOGRAPHIC STUDIES: I have personally reviewed the radiological images as listed and agreed with the findings in the report. No results found. ASSESSMENT & PLAN:   Carcinoma of upper-outer quadrant of right breast in female, estrogen receptor positive ([place]) #Right breast-invasive mammary carcinoma- S/P LUMPECTOMY pT1a [51m];pN0 [STAGE IA- ER/PRpoS; Her 2 POS]; STAGE I. STABLE- on adjuvant Taxol-Herceptin.  # Proceed with cycle #2-day-1 today. Labs today reviewed;  acceptable for treatment today.   #Slightly elevated AST ALT x 2 times times normal limits-currently normal limits.  STABLE.   # Hyperthyroidism-methimazole-stable  # Fatigue-grade 1; secondary chemotherapy stable.  # Diarrhea- G-1; immodium prn.   # SWOG study/PN study- PN- G-0.   # Disposition:  # Chemo today #  1 week- labs-cbc/bmp/taxol-hercerptin #  follow up in 2 weeks- MD; labs- cbc/cmp; weekly taxol-Herceptin- Dr.B  Cc: Dr.Cintron  All questions were answered. The patient/family knows to call the clinic with any problems, questions or concerns.    GCammie Sickle MD 09/26/2019 5:58 PM

## 2019-09-25 NOTE — Research (Signed)
Patient Connie Bradford presents to clinic this morning for her scheduled infusion of Taxol + Herceptin, which will be Cycle 5. Solicited Neuropathy events were assessed and patient denies experiencing any pain, numbness, tingling or loss of sensation at all since she began her chemotherapy 4 weeks ago. She does report some mild fatigue that peaks about 3 days after her infusions, then gradually subsides.  Dr. Rogue Bussing has recommended exercise to combat this. She also reports alopecia, which is nearly total hair loss at this point, and is an expected adverse event. [name] completed the week 4 study questionnaires including: PROMIS 29, GSLPTAQ, EORTC QLQ-CIPN20, PRO-CTCAE and Follow up Patient Symptom burden questionnaire. Dr. Rogue Bussing performed H&P and completed the Follow up Physician Toxicity Burden assessment. Height, weight and VS were assessed prior to MD visit. Patient confirms she is right hand and right foot dominant. Neuropen and Tuning fork assessments completed with assistance of Jeral Fruit, RN for timed assessments. Patient denies any alternative treatments for neuropathy because she is not experiencing this at all. Solicited and other adverse events with grade and attribution as note below:   Solicited Adverse Event Log  Study/Protocol: SWOG [id number] Cycle: Week 4 Event Grade Onset Date Resolved Date Drug Name Attribution Treatment Comments  Dysesthesia 0        Neuralgia 0        Paresthesia 0        Peripheral Motor Neuropathy 0        Peripheral Sensory Neuropathy 0        Fatigue (not a solicited AE) 1 09/21/4401  Taxol+Herceptin definitely exercise First reported today  Alopecia (not solicited) 2 07/23/4257  Taxol definitely    [name], BSN, MHA, OCN 09/25/2019 9:06 AM

## 2019-09-25 NOTE — Assessment & Plan Note (Addendum)
#  Right breast-invasive mammary carcinoma- S/P LUMPECTOMY pT1a [43mm];pN0 [STAGE IA- ER/PRpoS; Her 2 POS]; STAGE I. STABLE- on adjuvant Taxol-Herceptin.  # Proceed with cycle #2-day-1 today. Labs today reviewed;  acceptable for treatment today.   #Slightly elevated AST ALT x 2 times times normal limits-currently normal limits.  STABLE.   # Hyperthyroidism-methimazole-stable  # Fatigue-grade 1; secondary chemotherapy stable.  # Diarrhea- G-1; immodium prn.   # SWOG study/PN study- PN- G-0.   # Disposition:  # Chemo today #  1 week- labs-cbc/bmp/taxol-hercerptin #  follow up in 2 weeks- MD; labs- cbc/cmp; weekly taxol-Herceptin- Dr.B  Cc: Dr.Cintron

## 2019-09-25 NOTE — Progress Notes (Signed)
Per Nira Conn, RN per Dr. Rogue Bussing, okay to release treatment orders despite all results from cbc/d not being resulted.

## 2019-09-30 DIAGNOSIS — E0500 Thyrotoxicosis with diffuse goiter without thyrotoxic crisis or storm: Secondary | ICD-10-CM | POA: Diagnosis not present

## 2019-10-02 ENCOUNTER — Inpatient Hospital Stay: Payer: 59

## 2019-10-02 ENCOUNTER — Other Ambulatory Visit: Payer: Self-pay

## 2019-10-02 VITALS — BP 133/71 | HR 74 | Temp 98.1°F | Resp 18 | Wt 177.4 lb

## 2019-10-02 DIAGNOSIS — Z170 Estrogen receptor positive status [ER+]: Secondary | ICD-10-CM

## 2019-10-02 DIAGNOSIS — Z5112 Encounter for antineoplastic immunotherapy: Secondary | ICD-10-CM | POA: Diagnosis not present

## 2019-10-02 DIAGNOSIS — C50411 Malignant neoplasm of upper-outer quadrant of right female breast: Secondary | ICD-10-CM

## 2019-10-02 LAB — CBC WITH DIFFERENTIAL/PLATELET
Abs Immature Granulocytes: 0.12 10*3/uL — ABNORMAL HIGH (ref 0.00–0.07)
Basophils Absolute: 0.1 10*3/uL (ref 0.0–0.1)
Basophils Relative: 3 %
Eosinophils Absolute: 0.1 10*3/uL (ref 0.0–0.5)
Eosinophils Relative: 2 %
HCT: 31.1 % — ABNORMAL LOW (ref 36.0–46.0)
Hemoglobin: 10.1 g/dL — ABNORMAL LOW (ref 12.0–15.0)
Immature Granulocytes: 4 %
Lymphocytes Relative: 34 %
Lymphs Abs: 1.2 10*3/uL (ref 0.7–4.0)
MCH: 29 pg (ref 26.0–34.0)
MCHC: 32.5 g/dL (ref 30.0–36.0)
MCV: 89.4 fL (ref 80.0–100.0)
Monocytes Absolute: 0.4 10*3/uL (ref 0.1–1.0)
Monocytes Relative: 11 %
Neutro Abs: 1.6 10*3/uL — ABNORMAL LOW (ref 1.7–7.7)
Neutrophils Relative %: 46 %
Platelets: 276 10*3/uL (ref 150–400)
RBC: 3.48 MIL/uL — ABNORMAL LOW (ref 3.87–5.11)
RDW: 16 % — ABNORMAL HIGH (ref 11.5–15.5)
WBC: 3.4 10*3/uL — ABNORMAL LOW (ref 4.0–10.5)
nRBC: 0 % (ref 0.0–0.2)

## 2019-10-02 LAB — BASIC METABOLIC PANEL
Anion gap: 11 (ref 5–15)
BUN: 18 mg/dL (ref 6–20)
CO2: 22 mmol/L (ref 22–32)
Calcium: 9.2 mg/dL (ref 8.9–10.3)
Chloride: 107 mmol/L (ref 98–111)
Creatinine, Ser: 0.95 mg/dL (ref 0.44–1.00)
GFR calc Af Amer: >60 mL/min (ref >60)
GFR calc non Af Amer: >60 mL/min (ref >60)
Glucose, Bld: 111 mg/dL — ABNORMAL HIGH (ref 70–99)
Potassium: 4.1 mmol/L (ref 3.5–5.1)
Sodium: 140 mmol/L (ref 135–145)

## 2019-10-02 MED ORDER — DIPHENHYDRAMINE HCL 50 MG/ML IJ SOLN
25.0000 mg | Freq: Once | INTRAMUSCULAR | Status: AC
  Administered 2019-10-02: 25 mg via INTRAVENOUS
  Filled 2019-10-02: qty 1

## 2019-10-02 MED ORDER — HEPARIN SOD (PORK) LOCK FLUSH 100 UNIT/ML IV SOLN
500.0000 [IU] | Freq: Once | INTRAVENOUS | Status: AC | PRN
  Administered 2019-10-02: 500 [IU]
  Filled 2019-10-02: qty 5

## 2019-10-02 MED ORDER — SODIUM CHLORIDE 0.9 % IV SOLN
Freq: Once | INTRAVENOUS | Status: AC
  Filled 2019-10-02: qty 250

## 2019-10-02 MED ORDER — ACETAMINOPHEN 325 MG PO TABS
650.0000 mg | ORAL_TABLET | Freq: Once | ORAL | Status: AC
  Administered 2019-10-02: 650 mg via ORAL
  Filled 2019-10-02: qty 2

## 2019-10-02 MED ORDER — TRASTUZUMAB-ANNS CHEMO 150 MG IV SOLR
150.0000 mg | Freq: Once | INTRAVENOUS | Status: AC
  Administered 2019-10-02: 150 mg via INTRAVENOUS
  Filled 2019-10-02: qty 7.14

## 2019-10-02 MED ORDER — SODIUM CHLORIDE 0.9 % IV SOLN
80.0000 mg/m2 | Freq: Once | INTRAVENOUS | Status: AC
  Administered 2019-10-02: 156 mg via INTRAVENOUS
  Filled 2019-10-02: qty 26

## 2019-10-02 MED ORDER — SODIUM CHLORIDE 0.9 % IV SOLN
20.0000 mg | Freq: Once | INTRAVENOUS | Status: AC
  Administered 2019-10-02: 20 mg via INTRAVENOUS
  Filled 2019-10-02: qty 20

## 2019-10-02 MED ORDER — FAMOTIDINE IN NACL 20-0.9 MG/50ML-% IV SOLN
20.0000 mg | Freq: Once | INTRAVENOUS | Status: AC
  Administered 2019-10-02: 20 mg via INTRAVENOUS
  Filled 2019-10-02: qty 50

## 2019-10-02 MED ORDER — HEPARIN SOD (PORK) LOCK FLUSH 100 UNIT/ML IV SOLN
INTRAVENOUS | Status: AC
  Filled 2019-10-02: qty 5

## 2019-10-02 NOTE — Progress Notes (Signed)
Basic Metabolic Panel was ordered and drawn today. Per MD, Dr. Rogue Bussing, order: Comprehensive Metabolic Panel does not need to be done today; reference Basic Metabolic Panel and proceed with scheduled Kanjinti and Taxol treatment at this time.

## 2019-10-03 ENCOUNTER — Ambulatory Visit (INDEPENDENT_AMBULATORY_CARE_PROVIDER_SITE_OTHER): Payer: 59 | Admitting: Dermatology

## 2019-10-03 ENCOUNTER — Other Ambulatory Visit: Payer: Self-pay

## 2019-10-03 DIAGNOSIS — L750 Bromhidrosis: Secondary | ICD-10-CM

## 2019-10-03 DIAGNOSIS — D225 Melanocytic nevi of trunk: Secondary | ICD-10-CM | POA: Diagnosis not present

## 2019-10-03 DIAGNOSIS — L719 Rosacea, unspecified: Secondary | ICD-10-CM | POA: Diagnosis not present

## 2019-10-03 DIAGNOSIS — L98491 Non-pressure chronic ulcer of skin of other sites limited to breakdown of skin: Secondary | ICD-10-CM | POA: Diagnosis not present

## 2019-10-03 DIAGNOSIS — Z1283 Encounter for screening for malignant neoplasm of skin: Secondary | ICD-10-CM | POA: Diagnosis not present

## 2019-10-03 DIAGNOSIS — D229 Melanocytic nevi, unspecified: Secondary | ICD-10-CM | POA: Diagnosis not present

## 2019-10-03 DIAGNOSIS — D1801 Hemangioma of skin and subcutaneous tissue: Secondary | ICD-10-CM

## 2019-10-03 DIAGNOSIS — D692 Other nonthrombocytopenic purpura: Secondary | ICD-10-CM

## 2019-10-03 DIAGNOSIS — R61 Generalized hyperhidrosis: Secondary | ICD-10-CM

## 2019-10-03 DIAGNOSIS — Z85828 Personal history of other malignant neoplasm of skin: Secondary | ICD-10-CM | POA: Diagnosis not present

## 2019-10-03 DIAGNOSIS — L814 Other melanin hyperpigmentation: Secondary | ICD-10-CM

## 2019-10-03 DIAGNOSIS — L578 Other skin changes due to chronic exposure to nonionizing radiation: Secondary | ICD-10-CM

## 2019-10-03 DIAGNOSIS — D2271 Melanocytic nevi of right lower limb, including hip: Secondary | ICD-10-CM | POA: Diagnosis not present

## 2019-10-03 DIAGNOSIS — L821 Other seborrheic keratosis: Secondary | ICD-10-CM

## 2019-10-03 DIAGNOSIS — Z86018 Personal history of other benign neoplasm: Secondary | ICD-10-CM

## 2019-10-03 MED ORDER — CLINDAMYCIN PHOSPHATE 1 % EX LOTN: TOPICAL_LOTION | Freq: Every day | CUTANEOUS | 11 refills | Status: DC

## 2019-10-03 NOTE — Progress Notes (Signed)
Follow-Up Visit   Subjective  Connie Bradford is a 59 y.o. female who presents for the following: Annual Exam. The patient presents for Total-Body Skin Exam (TBSE) for skin cancer screening and mole check.  Patient here today for TBSE. She has a history of BCC and DN but is not aware of any new or changing spots. Patient was diagnosed with breast cancer in March 2021, had surgery in April and started chemo May 12th. She does have some spots at wrists that she feels may be related to her chemo and would like you to look at her excision site.  The following portions of the chart were reviewed this encounter and updated as appropriate:  Tobacco   Allergies   Meds   Problems   Med Hx   Surg Hx   Fam Hx       Review of Systems:  No other skin or systemic complaints except as noted in HPI or Assessment and Plan.  Objective  Well appearing patient in no apparent distress; mood and affect are within normal limits.  A full examination was performed including scalp, head, eyes, ears, nose, lips, neck, chest, axillae, abdomen, back, buttocks, bilateral upper extremities, bilateral lower extremities, hands, feet, fingers, toes, fingernails, and toenails. All findings within normal limits unless otherwise noted below.  Objective  Forearms: Violaceous macules and patches.   Objective  Cheeks: Pinkness at cheeks  Objective  Right Mid Back lateral: Congenital nevus   Right Mid Sole: 38mm regular brown macule  Images    Objective  Left Axilla, Right Axilla: Excessive sweating  Objective  Right Breast: Ulcer   Objective  Right mid anterior side: Dyspigmented smooth macule or patch.   Objective  Right mid cheek infraorbital: Dyspigmented smooth macule or patch.    Assessment & Plan    Purpura ([place]) Forearms  Benign, observe.  Probably related to low platelets.    Rosacea Cheeks  Erythrotelangiectatic type BBL laser treatment discussed   Nevus (2) Right Mid  Back lateral; Right Mid Sole  Benign, observe.    Bromhidrosis (2) Left Axilla; Right Axilla  Continue clindamycin lotion daily as needed.   Ordered Medications: clindamycin (CLEOCIN-T) 1 % lotion  Hyperhidrosis (2) Left Axilla; Right Axilla  Discussed Qbrexa, patient declines treatment at this time  Non-pressure chronic ulcer of skin of other sites limited to breakdown of skin ([employer]), associated breast cancer for which she is being treated. Right Breast Associated with previous breast surgery and treatment for breast cancer.  No evidence of infection. May continue A&D ointment or use clindamycin lotion daily as needed.  History of dysplastic nevus Right mid anterior side Clear. Observe for recurrence. Call clinic for new or changing lesions.  Recommend regular skin exams, daily broad-spectrum spf 30+ sunscreen use, and photoprotection.    No evidence of recurrence, call clinic for new or changing lesions.   History of basal cell carcinoma (BCC) Right mid cheek infraorbital Clear. Observe for recurrence. Call clinic for new or changing lesions.  Recommend regular skin exams, daily broad-spectrum spf 30+ sunscreen use, and photoprotection.    No evidence of recurrence, call clinic for new or changing lesions.  Lentigines - Scattered tan macules - Discussed due to sun exposure - Benign, observe - Call for any changes  Seborrheic Keratoses - Stuck-on, waxy, tan-brown papules and plaques  - Discussed benign etiology and prognosis. - Observe - Call for any changes  Melanocytic Nevi - Tan-brown and/or pink-flesh-colored symmetric macules and papules - Benign  appearing on exam today - Observation - Call clinic for new or changing moles - Recommend daily use of broad spectrum spf 30+ sunscreen to sun-exposed areas.   Hemangiomas - Red papules - Discussed benign nature - Observe - Call for any changes  Actinic Damage - diffuse scaly erythematous macules with  underlying dyspigmentation - Recommend daily broad spectrum sunscreen SPF 30+ to sun-exposed areas, reapply every 2 hours as needed.  - Call for new or changing lesions.  Skin cancer screening performed today.  Return in about 1 year (around 10/02/2020) for TBSE.  Graciella Belton, RMA, am acting as scribe for Sarina Ser, MD . Documentation: I have reviewed the above documentation for accuracy and completeness, and I agree with the above.  Sarina Ser, MD

## 2019-10-03 NOTE — Patient Instructions (Signed)
Recommend daily broad spectrum sunscreen SPF 30+ to sun-exposed areas, reapply every 2 hours as needed. Call for new or changing lesions.  

## 2019-10-06 ENCOUNTER — Encounter: Payer: Self-pay | Admitting: Dermatology

## 2019-10-07 DIAGNOSIS — E0500 Thyrotoxicosis with diffuse goiter without thyrotoxic crisis or storm: Secondary | ICD-10-CM | POA: Diagnosis not present

## 2019-10-09 ENCOUNTER — Inpatient Hospital Stay: Payer: 59

## 2019-10-09 ENCOUNTER — Inpatient Hospital Stay (HOSPITAL_BASED_OUTPATIENT_CLINIC_OR_DEPARTMENT_OTHER): Payer: 59 | Admitting: Internal Medicine

## 2019-10-09 ENCOUNTER — Encounter: Payer: Self-pay | Admitting: Internal Medicine

## 2019-10-09 ENCOUNTER — Other Ambulatory Visit: Payer: Self-pay

## 2019-10-09 DIAGNOSIS — Z5112 Encounter for antineoplastic immunotherapy: Secondary | ICD-10-CM | POA: Diagnosis not present

## 2019-10-09 DIAGNOSIS — Z170 Estrogen receptor positive status [ER+]: Secondary | ICD-10-CM

## 2019-10-09 DIAGNOSIS — C50411 Malignant neoplasm of upper-outer quadrant of right female breast: Secondary | ICD-10-CM | POA: Diagnosis not present

## 2019-10-09 DIAGNOSIS — Z95828 Presence of other vascular implants and grafts: Secondary | ICD-10-CM

## 2019-10-09 LAB — CBC WITH DIFFERENTIAL/PLATELET
Abs Immature Granulocytes: 0.15 10*3/uL — ABNORMAL HIGH (ref 0.00–0.07)
Basophils Absolute: 0.1 10*3/uL (ref 0.0–0.1)
Basophils Relative: 2 %
Eosinophils Absolute: 0.1 10*3/uL (ref 0.0–0.5)
Eosinophils Relative: 3 %
HCT: 33.1 % — ABNORMAL LOW (ref 36.0–46.0)
Hemoglobin: 10.6 g/dL — ABNORMAL LOW (ref 12.0–15.0)
Immature Granulocytes: 4 %
Lymphocytes Relative: 31 %
Lymphs Abs: 1.3 10*3/uL (ref 0.7–4.0)
MCH: 28.9 pg (ref 26.0–34.0)
MCHC: 32 g/dL (ref 30.0–36.0)
MCV: 90.2 fL (ref 80.0–100.0)
Monocytes Absolute: 0.5 10*3/uL (ref 0.1–1.0)
Monocytes Relative: 12 %
Neutro Abs: 2 10*3/uL (ref 1.7–7.7)
Neutrophils Relative %: 48 %
Platelets: 276 10*3/uL (ref 150–400)
RBC: 3.67 MIL/uL — ABNORMAL LOW (ref 3.87–5.11)
RDW: 16.7 % — ABNORMAL HIGH (ref 11.5–15.5)
WBC: 4.1 10*3/uL (ref 4.0–10.5)
nRBC: 0 % (ref 0.0–0.2)

## 2019-10-09 LAB — COMPREHENSIVE METABOLIC PANEL
ALT: 42 U/L (ref 0–44)
AST: 31 U/L (ref 15–41)
Albumin: 4.1 g/dL (ref 3.5–5.0)
Alkaline Phosphatase: 70 U/L (ref 38–126)
Anion gap: 11 (ref 5–15)
BUN: 19 mg/dL (ref 6–20)
CO2: 23 mmol/L (ref 22–32)
Calcium: 9.3 mg/dL (ref 8.9–10.3)
Chloride: 106 mmol/L (ref 98–111)
Creatinine, Ser: 0.74 mg/dL (ref 0.44–1.00)
GFR calc Af Amer: >60 mL/min (ref >60)
GFR calc non Af Amer: >60 mL/min (ref >60)
Glucose, Bld: 143 mg/dL — ABNORMAL HIGH (ref 70–99)
Potassium: 4 mmol/L (ref 3.5–5.1)
Sodium: 140 mmol/L (ref 135–145)
Total Bilirubin: 0.8 mg/dL (ref 0.3–1.2)
Total Protein: 7 g/dL (ref 6.5–8.1)

## 2019-10-09 MED ORDER — SODIUM CHLORIDE 0.9 % IV SOLN
80.0000 mg/m2 | Freq: Once | INTRAVENOUS | Status: AC
  Administered 2019-10-09: 156 mg via INTRAVENOUS
  Filled 2019-10-09: qty 26

## 2019-10-09 MED ORDER — SODIUM CHLORIDE 0.9 % IV SOLN
Freq: Once | INTRAVENOUS | Status: AC
  Filled 2019-10-09: qty 250

## 2019-10-09 MED ORDER — FAMOTIDINE IN NACL 20-0.9 MG/50ML-% IV SOLN
20.0000 mg | Freq: Once | INTRAVENOUS | Status: AC
  Administered 2019-10-09: 20 mg via INTRAVENOUS
  Filled 2019-10-09: qty 50

## 2019-10-09 MED ORDER — SODIUM CHLORIDE 0.9% FLUSH
10.0000 mL | INTRAVENOUS | Status: DC | PRN
  Administered 2019-10-09: 10 mL via INTRAVENOUS
  Filled 2019-10-09: qty 10

## 2019-10-09 MED ORDER — HEPARIN SOD (PORK) LOCK FLUSH 100 UNIT/ML IV SOLN
500.0000 [IU] | Freq: Once | INTRAVENOUS | Status: AC | PRN
  Administered 2019-10-09: 500 [IU]
  Filled 2019-10-09: qty 5

## 2019-10-09 MED ORDER — DIPHENHYDRAMINE HCL 50 MG/ML IJ SOLN
25.0000 mg | Freq: Once | INTRAMUSCULAR | Status: AC
  Administered 2019-10-09: 25 mg via INTRAVENOUS
  Filled 2019-10-09: qty 1

## 2019-10-09 MED ORDER — HEPARIN SOD (PORK) LOCK FLUSH 100 UNIT/ML IV SOLN
INTRAVENOUS | Status: AC
  Filled 2019-10-09: qty 5

## 2019-10-09 MED ORDER — ACETAMINOPHEN 325 MG PO TABS
650.0000 mg | ORAL_TABLET | Freq: Once | ORAL | Status: AC
  Administered 2019-10-09: 650 mg via ORAL
  Filled 2019-10-09: qty 2

## 2019-10-09 MED ORDER — TRASTUZUMAB-ANNS CHEMO 150 MG IV SOLR
150.0000 mg | Freq: Once | INTRAVENOUS | Status: AC
  Administered 2019-10-09: 150 mg via INTRAVENOUS
  Filled 2019-10-09: qty 7.14

## 2019-10-09 MED ORDER — SODIUM CHLORIDE 0.9 % IV SOLN
20.0000 mg | Freq: Once | INTRAVENOUS | Status: AC
  Administered 2019-10-09: 20 mg via INTRAVENOUS
  Filled 2019-10-09: qty 20

## 2019-10-09 NOTE — Assessment & Plan Note (Addendum)
#  Right breast-invasive mammary carcinoma- S/P LUMPECTOMY pT1a [72mm];pN0 [STAGE IA- ER/PRpoS; Her 2 POS]; STAGE I. STABLE. On adjuvant Taxol-Herceptin.  # Proceed with cycle #2-day-15 today. Labs today reviewed;  acceptable for treatment today.   #Slightly elevated AST ALT x 2 times times normal limits-currently normal limits. STABLE.   # Fatigue-grade 1; secondary chemotherapy stable.  # SWOG study/PN study- PN- G-1.  Monitor for now.  # rash- sec to taxol recommend hydrocortisone prn; sun screen.   # Disposition:  # Chemo today #  1 week- labs-cbc/bmp/taxol-hercerptin #  follow up in 2 weeks- MD; labs- cbc/cmp; weekly taxol-Herceptin- Dr.B  Cc: Dr.Cintron

## 2019-10-09 NOTE — Progress Notes (Signed)
one [place] NOTE  Patient Care Team: Birdie Sons, MD as PCP - General (Family Medicine) [facility] Gyn (Gynecology) Theodore Demark, RN as Oncology Nurse Navigator Cammie Sickle, MD as Consulting Physician (Hematology and Oncology)  CHIEF COMPLAINTS/PURPOSE OF CONSULTATION: Breast cancer  #  Oncology History Overview Note  # MARCH 2021- RUOQ- 2m- [BREAST, RIGHT, LATERAL POSTERIOR  - INVASIVE MAMMARY CARCINOMA, NO SPECIAL TYPE, ASSOCIATED WITH  CALCIFICATIONS.][place] ER-> 90%; PR- 50-90%; HER 2 FISH- POSITIVE  # S/P LUMPECTOMY [Dr.Cintron] pT1a [448m;pN0 [STAGE IA- ER/PRpoS; Her 2 POS]  . BREAST, RIGHT, LATERAL MIDDLE DEPTH (RIBBON-SHAPED CLIP):  STEREOTACTIC-GUIDED CORE BIOPSY: - DUCTAL CARCINOMA IN SITU (DCIS), HIGH-GRADE, ASSOCIATED WITH  CALCIFICATIONS.   # May 14th,2021- TH-H  #Hypothyroidism-methimazole [Dr.Solum]  # SURVIVORSHIP:   # GENETICS:   DIAGNOSIS: Breast cancer  STAGE:   1      ;  GOALS: Cure  CURRENT/MOST RECENT THERAPY : Taxol Herceptin   Carcinoma of upper-outer quadrant of right breast in female, estrogen receptor positive ([place] 07/15/[place] Initial Diagnosis   Carcinoma of upper-outer quadrant of right breast in female, estrogen receptor positive ([place]  08/28/2019 -  Chemotherapy   The patient had PACLitaxel (TAXOL) 156 mg in sodium chloride 0.9 % 250 mL chemo infusion (</= 8033m2), 80 mg/m2 = 156 mg, Intravenous,  Once, 2 of 3 cycles Administration: 156 mg (08/28/2019), 156 mg (09/04/2019), 156 mg (09/25/2019), 156 mg (09/11/2019), 156 mg (09/18/2019), 156 mg (10/02/2019), 156 mg (10/09/2019) trastuzumab-anns (KANJINTI) 336 mg in sodium chloride 0.9 % 250 mL chemo infusion, 4 mg/kg = 336 mg (100 % of original dose 4 mg/kg), Intravenous,  Once, 2 of 16 cycles Dose modification: 4 mg/kg (original dose 4 mg/kg, Cycle 1, Reason: Other (see comments), Comment: loading dose, biosimilar per insurance) Administration: 336 mg (08/28/2019),  150 mg (09/04/2019), 150 mg (09/25/2019), 150 mg (09/11/2019), 150 mg (09/18/2019), 150 mg (10/02/2019), 150 mg (10/09/2019)  for chemotherapy treatment.      HISTORY OF PRESENTING ILLNESS:  AngKandiceSmall [age]o.  female with breast cancer early stage-ER/PR positive HER-2/neu positive on adjuvant chemotherapy.  Patient noted to have tingling and numbness in the fingertips and toes about 2 weeks ago.  Intermittent.  Not interrupting her daily lifestyle or her ADLs.  Also noted to have mild skin rash on the dorsal surface of her hands again comes and goes.  Not itching.  No fever no chills but no nausea no vomiting.   Review of Systems  Constitutional: Negative for chills, diaphoresis, fever, malaise/fatigue and weight loss.  HENT: Negative for nosebleeds and sore throat.   Eyes: Negative for double vision.  Respiratory: Negative for cough, hemoptysis, sputum production, shortness of breath and wheezing.   Cardiovascular: Negative for chest pain, palpitations, orthopnea and leg swelling.  Gastrointestinal: Negative for abdominal pain, blood in stool, constipation, diarrhea, heartburn, melena, nausea and vomiting.  Genitourinary: Negative for dysuria, frequency and urgency.  Musculoskeletal: Negative for back pain and joint pain.  Skin: Positive for rash. Negative for itching.  Neurological: Positive for tingling. Negative for dizziness, focal weakness, weakness and headaches.  Endo/Heme/Allergies: Does not bruise/bleed easily.  Psychiatric/Behavioral: Negative for depression. The patient is not nervous/anxious and does not have insomnia.      MEDICAL HISTORY:  Past Medical History:  Diagnosis Date  . Atypical mole 09/26/2018   right mid anterior side/mild  . Basal cell carcinoma 08/25/2014   right med cheek infraorbital  . Breast cancer ([facility]  [facility] and DCIS  . Dyslipidemia   . Family history of bladder cancer   . Graves disease   . Hyperthyroidism   . Inborn lipid storage  disorder   . Leiomyoma of uterus   . Vitamin D deficiency     SURGICAL HISTORY: Past Surgical History:  Procedure Laterality Date  . APPENDECTOMY  1982   Cyst removed from ovaries   . BREAST BIOPSY Right 07/05/2019   stereo bx, coil clip, I[facility]  . BREAST BIOPSY Right 07/05/2019   stereo bx, ribbon clip, DCIS   . BREAST LUMPECTOMY Right 07/31/2019    [facility] and DCIS with SN bx  . COLONOSCOPY WITH PROPOFOL N/A 01/20/2017   Procedure: COLONOSCOPY WITH PROPOFOL;  Surgeon: Jonathon Bellows, MD;  Location: [facility] ENDOSCOPY;  Service: Gastroenterology;  Laterality: N/A;  . [place]  . NECK SURGERY  2013   LIft from weight loss  . PARTIAL MASTECTOMY WITH NEEDLE LOCALIZATION AND AXILLARY SENTINEL LYMPH NODE BX Right 07/31/2019   Procedure: RIGHT PARTIAL MASTECTOMY WITH BRACKETED NEEDLE LOCALIZATION AND LEFT AXILLARY SENTINEL LYMPH NODE BIOPSY;  Surgeon: Herbert Pun, MD;  Location: [facility] ORS;  Service: General;  Laterality: Right;  . PORTACATH PLACEMENT Left 07/31/2019   Procedure: INSERTION PORT-A-CATH LEFT INTERNAL JUGULAR;  Surgeon: Herbert Pun, MD;  Location: [facility] ORS;  Service: General;  Laterality: Left;  . TONSILLECTOMY  1969    SOCIAL HISTORY: Social History   Socioeconomic History  . Marital status: Married    Spouse name: Not on file  . Number of children: 0  . Years of education: Anselm Lis  . Highest education level: Not on file  Occupational History  . Occupation: Full-Time    Comment: [employer] x9  Tobacco Use  . Smoking status: Former Research scientist (life sciences)  . Smokeless tobacco: Never Used  Vaping Use  . Vaping Use: Never used  Substance and Sexual Activity  . Alcohol use: Yes    Alcohol/week: 0.0 standard drinks    Comment: RARELY  . Drug use: No  . Sexual activity: Yes    Birth control/protection: Post-menopausal  Other Topics Concern  . Not on file  Social History Narrative   Lives close to [place]; with husband. Quit smoking  in 2007 [25 years]; ocassional alcohol. 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    Social Determinants of Health   Financial Resource Strain:   . Difficulty of Paying Living Expenses:   Food Insecurity:   . Worried About Charity fundraiser in the Last Year:   . Arboriculturist in the Last Year:   Transportation Needs:   . Film/video editor (Medical):   Marland Kitchen Lack of Transportation (Non-Medical):   Physical Activity:   . Days of Exercise per Week:   . Minutes of Exercise per Session:   Stress:   . Feeling of Stress :   Social Connections:   . Frequency of Communication with Friends and Family:   . Frequency of Social Gatherings with Friends and Family:   . Attends Religious Services:   . Active Member of Clubs or Organizations:   . Attends Archivist Meetings:   Marland Kitchen Marital Status:   Intimate Partner Violence:   . Fear of Current or Ex-Partner:   . Emotionally Abused:   Marland Kitchen Physically Abused:   . Sexually Abused:     FAMILY HISTORY: Family History  Problem Relation Age of Onset  . Bladder Cancer Mother   . Diabetes Father   . Heart  disease Father   . Bladder Cancer Brother   . Breast cancer Neg Hx     ALLERGIES:  is allergic to penicillins.  MEDICATIONS:  Current Outpatient Medications  Medication Sig Dispense Refill  . B COMPLEX VITAMINS PO Take 1 tablet by mouth daily.     . Cholecalciferol (VITAMIN D) 125 MCG (5000 UT) CAPS Take 5,000 Units by mouth daily.     . COLLAGEN PO Take 1 tablet by mouth daily. Super Collagen w/Biotin    . lidocaine-prilocaine (EMLA) cream Apply 1 application topically as needed. 30 g 0  . methimazole (TAPAZOLE) 5 MG tablet Take 5 mg by mouth daily.     . Multiple Vitamin (MULTIVITAMIN WITH MINERALS) TABS tablet Take 1 tablet by mouth daily.    . Omega 3-6-9 Fatty Acids (OMEGA 3-6-9 COMPLEX) CAPS Take 1 capsule by mouth daily.    . ondansetron (ZOFRAN) 8 MG tablet One pill every 8 hours as needed for nausea/vomitting. 40 tablet 1  .  prochlorperazine (COMPAZINE) 10 MG tablet Take 1 tablet (10 mg total) by mouth every 6 (six) hours as needed for nausea or vomiting. 40 tablet 1  . valACYclovir (VALTREX) 500 MG tablet Take 500 mg by mouth 2 (two) times a week.     . clindamycin (CLEOCIN-T) 1 % lotion Apply topically daily. (Patient not taking: Reported on 10/09/2019) 60 mL 11  . ibuprofen (ADVIL) 200 MG tablet Take 600 mg by mouth every 8 (eight) hours as needed (for pain.). (Patient not taking: Reported on 10/09/2019)     No current facility-administered medications for this visit.    PHYSICAL EXAMINATION: ECOG PERFORMANCE STATUS: 0 - Asymptomatic  Vitals:   10/09/19 0839  BP: 139/65  Pulse: 91  Resp: 16  Temp: 97.9 F (36.6 C)  SpO2: 100%   Filed Weights   10/09/19 0839  Weight: 181 lb 6.4 oz (82.3 kg)    Physical Exam HENT:     Head: Normocephalic and atraumatic.     Mouth/Throat:     Pharynx: No oropharyngeal exudate.  Eyes:     Pupils: Pupils are equal, round, and reactive to light.  Cardiovascular:     Rate and Rhythm: Normal rate and regular rhythm.  Pulmonary:     Effort: Pulmonary effort is normal. No respiratory distress.     Breath sounds: Normal breath sounds. No wheezing.  Abdominal:     General: Bowel sounds are normal. There is no distension.     Palpations: Abdomen is soft. There is no mass.     Tenderness: There is no abdominal tenderness. There is no guarding or rebound.  Musculoskeletal:        General: No tenderness. Normal range of motion.     Cervical back: Normal range of motion and neck supple.  Skin:    General: Skin is warm.     Comments: Rash on bilateral hands.   Neurological:     Mental Status: She is alert and oriented to person, place, and time.  Psychiatric:        Mood and Affect: Affect normal.    LABORATORY DATA:  I have reviewed the data as listed Lab Results  Component Value Date   WBC 4.1 10/09/2019   HGB 10.6 (L) 10/09/2019   HCT 33.1 (L) 10/09/2019    MCV 90.2 10/09/2019   PLT 276 10/09/2019   Recent Labs    09/11/19 0840 09/18/19 0905 09/25/19 0809 10/02/19 0814 10/09/19 0759  NA 135   < >  140 140 140  K 3.9   < > 4.0 4.1 4.0  CL 104   < > 106 107 106  CO2 25   < > '24 22 23  ' GLUCOSE 148*   < > 107* 111* 143*  BUN 15   < > 21* 18 19  CREATININE 0.84   < > 0.79 0.95 0.74  CALCIUM 8.9   < > 9.4 9.2 9.3  GFRNONAA >60   < > >60 >60 >60  GFRAA >60   < > >60 >60 >60  PROT 6.7  --  6.6  --  7.0  ALBUMIN 4.1  --  4.0  --  4.1  AST 40  --  33  --  31  ALT 61*  --  50*  --  42  ALKPHOS 74  --  70  --  70  BILITOT 0.6  --  0.5  --  0.8   < > = values in this interval not displayed.    RADIOGRAPHIC STUDIES: I have personally reviewed the radiological images as listed and agreed with the findings in the report. No results found. ASSESSMENT & PLAN:   Carcinoma of upper-outer quadrant of right breast in female, estrogen receptor positive ([place]) #Right breast-invasive mammary carcinoma- S/P LUMPECTOMY pT1a [63m];pN0 [STAGE IA- ER/PRpoS; Her 2 POS]; STAGE I. STABLE. On adjuvant Taxol-Herceptin.  # Proceed with cycle #2-day-15 today. Labs today reviewed;  acceptable for treatment today.   #Slightly elevated AST ALT x 2 times times normal limits-currently normal limits. STABLE.   # Fatigue-grade 1; secondary chemotherapy stable.  # SWOG study/PN study- PN- G-1.  Monitor for now.  # rash- sec to taxol recommend hydrocortisone prn; sun screen.   # Disposition:  # Chemo today #  1 week- labs-cbc/bmp/taxol-hercerptin #  follow up in 2 weeks- MD; labs- cbc/cmp; weekly taxol-Herceptin- Dr.B  Cc: Dr.Cintron  All questions were answered. The patient/family knows to call the clinic with any problems, questions or concerns.    GCammie Sickle MD 10/09/2019 1:08 PM

## 2019-10-16 ENCOUNTER — Inpatient Hospital Stay: Payer: 59

## 2019-10-16 ENCOUNTER — Other Ambulatory Visit: Payer: Self-pay

## 2019-10-16 VITALS — BP 114/62 | HR 73 | Temp 98.0°F | Resp 20 | Wt 179.0 lb

## 2019-10-16 DIAGNOSIS — Z5112 Encounter for antineoplastic immunotherapy: Secondary | ICD-10-CM | POA: Diagnosis not present

## 2019-10-16 DIAGNOSIS — Z170 Estrogen receptor positive status [ER+]: Secondary | ICD-10-CM

## 2019-10-16 LAB — CBC WITH DIFFERENTIAL/PLATELET
Abs Immature Granulocytes: 0.17 10*3/uL — ABNORMAL HIGH (ref 0.00–0.07)
Basophils Absolute: 0.1 10*3/uL (ref 0.0–0.1)
Basophils Relative: 3 %
Eosinophils Absolute: 0.1 10*3/uL (ref 0.0–0.5)
Eosinophils Relative: 3 %
HCT: 31.6 % — ABNORMAL LOW (ref 36.0–46.0)
Hemoglobin: 10.2 g/dL — ABNORMAL LOW (ref 12.0–15.0)
Immature Granulocytes: 4 %
Lymphocytes Relative: 26 %
Lymphs Abs: 1.1 10*3/uL (ref 0.7–4.0)
MCH: 29.5 pg (ref 26.0–34.0)
MCHC: 32.3 g/dL (ref 30.0–36.0)
MCV: 91.3 fL (ref 80.0–100.0)
Monocytes Absolute: 0.5 10*3/uL (ref 0.1–1.0)
Monocytes Relative: 11 %
Neutro Abs: 2.4 10*3/uL (ref 1.7–7.7)
Neutrophils Relative %: 53 %
Platelets: 268 10*3/uL (ref 150–400)
RBC: 3.46 MIL/uL — ABNORMAL LOW (ref 3.87–5.11)
RDW: 16.8 % — ABNORMAL HIGH (ref 11.5–15.5)
WBC: 4.5 10*3/uL (ref 4.0–10.5)
nRBC: 0 % (ref 0.0–0.2)

## 2019-10-16 LAB — COMPREHENSIVE METABOLIC PANEL
ALT: 44 U/L (ref 0–44)
AST: 31 U/L (ref 15–41)
Albumin: 3.9 g/dL (ref 3.5–5.0)
Alkaline Phosphatase: 67 U/L (ref 38–126)
Anion gap: 10 (ref 5–15)
BUN: 20 mg/dL (ref 6–20)
CO2: 21 mmol/L — ABNORMAL LOW (ref 22–32)
Calcium: 9.4 mg/dL (ref 8.9–10.3)
Chloride: 109 mmol/L (ref 98–111)
Creatinine, Ser: 0.8 mg/dL (ref 0.44–1.00)
GFR calc Af Amer: >60 mL/min (ref >60)
GFR calc non Af Amer: >60 mL/min (ref >60)
Glucose, Bld: 107 mg/dL — ABNORMAL HIGH (ref 70–99)
Potassium: 4.2 mmol/L (ref 3.5–5.1)
Sodium: 140 mmol/L (ref 135–145)
Total Bilirubin: 0.7 mg/dL (ref 0.3–1.2)
Total Protein: 6.8 g/dL (ref 6.5–8.1)

## 2019-10-16 MED ORDER — DIPHENHYDRAMINE HCL 50 MG/ML IJ SOLN
25.0000 mg | Freq: Once | INTRAMUSCULAR | Status: AC
  Administered 2019-10-16: 25 mg via INTRAVENOUS
  Filled 2019-10-16: qty 1

## 2019-10-16 MED ORDER — TRASTUZUMAB-ANNS CHEMO 150 MG IV SOLR
150.0000 mg | Freq: Once | INTRAVENOUS | Status: AC
  Administered 2019-10-16: 150 mg via INTRAVENOUS
  Filled 2019-10-16: qty 7.14

## 2019-10-16 MED ORDER — HEPARIN SOD (PORK) LOCK FLUSH 100 UNIT/ML IV SOLN
INTRAVENOUS | Status: AC
  Filled 2019-10-16: qty 5

## 2019-10-16 MED ORDER — ACETAMINOPHEN 325 MG PO TABS
650.0000 mg | ORAL_TABLET | Freq: Once | ORAL | Status: AC
  Administered 2019-10-16: 650 mg via ORAL
  Filled 2019-10-16: qty 2

## 2019-10-16 MED ORDER — SODIUM CHLORIDE 0.9 % IV SOLN
20.0000 mg | Freq: Once | INTRAVENOUS | Status: AC
  Administered 2019-10-16: 20 mg via INTRAVENOUS
  Filled 2019-10-16: qty 20

## 2019-10-16 MED ORDER — FAMOTIDINE IN NACL 20-0.9 MG/50ML-% IV SOLN
20.0000 mg | Freq: Once | INTRAVENOUS | Status: AC
  Administered 2019-10-16: 20 mg via INTRAVENOUS
  Filled 2019-10-16: qty 50

## 2019-10-16 MED ORDER — SODIUM CHLORIDE 0.9 % IV SOLN
Freq: Once | INTRAVENOUS | Status: AC
  Filled 2019-10-16: qty 250

## 2019-10-16 MED ORDER — SODIUM CHLORIDE 0.9% FLUSH
10.0000 mL | Freq: Once | INTRAVENOUS | Status: AC
  Administered 2019-10-16: 10 mL via INTRAVENOUS
  Filled 2019-10-16: qty 10

## 2019-10-16 MED ORDER — SODIUM CHLORIDE 0.9 % IV SOLN
80.0000 mg/m2 | Freq: Once | INTRAVENOUS | Status: AC
  Administered 2019-10-16: 156 mg via INTRAVENOUS
  Filled 2019-10-16: qty 26

## 2019-10-16 MED ORDER — HEPARIN SOD (PORK) LOCK FLUSH 100 UNIT/ML IV SOLN
500.0000 [IU] | Freq: Once | INTRAVENOUS | Status: AC | PRN
  Administered 2019-10-16: 500 [IU]
  Filled 2019-10-16: qty 5

## 2019-10-22 ENCOUNTER — Other Ambulatory Visit: Payer: Self-pay | Admitting: *Deleted

## 2019-10-22 DIAGNOSIS — Z170 Estrogen receptor positive status [ER+]: Secondary | ICD-10-CM

## 2019-10-23 ENCOUNTER — Encounter: Payer: Self-pay | Admitting: *Deleted

## 2019-10-23 ENCOUNTER — Inpatient Hospital Stay: Payer: 59

## 2019-10-23 ENCOUNTER — Other Ambulatory Visit: Payer: Self-pay

## 2019-10-23 ENCOUNTER — Encounter: Payer: Self-pay | Admitting: Internal Medicine

## 2019-10-23 ENCOUNTER — Inpatient Hospital Stay (HOSPITAL_BASED_OUTPATIENT_CLINIC_OR_DEPARTMENT_OTHER): Payer: 59 | Admitting: Internal Medicine

## 2019-10-23 ENCOUNTER — Inpatient Hospital Stay: Payer: 59 | Attending: Internal Medicine

## 2019-10-23 DIAGNOSIS — C50411 Malignant neoplasm of upper-outer quadrant of right female breast: Secondary | ICD-10-CM

## 2019-10-23 DIAGNOSIS — Z006 Encounter for examination for normal comparison and control in clinical research program: Secondary | ICD-10-CM | POA: Insufficient documentation

## 2019-10-23 DIAGNOSIS — R7401 Elevation of levels of liver transaminase levels: Secondary | ICD-10-CM

## 2019-10-23 DIAGNOSIS — Z5111 Encounter for antineoplastic chemotherapy: Secondary | ICD-10-CM | POA: Diagnosis present

## 2019-10-23 DIAGNOSIS — Z170 Estrogen receptor positive status [ER+]: Secondary | ICD-10-CM | POA: Diagnosis not present

## 2019-10-23 LAB — CBC WITH DIFFERENTIAL/PLATELET
Abs Immature Granulocytes: 0.28 10*3/uL — ABNORMAL HIGH (ref 0.00–0.07)
Basophils Absolute: 0.1 10*3/uL (ref 0.0–0.1)
Basophils Relative: 2 %
Eosinophils Absolute: 0.1 10*3/uL (ref 0.0–0.5)
Eosinophils Relative: 3 %
HCT: 31.2 % — ABNORMAL LOW (ref 36.0–46.0)
Hemoglobin: 10.3 g/dL — ABNORMAL LOW (ref 12.0–15.0)
Immature Granulocytes: 6 %
Lymphocytes Relative: 29 %
Lymphs Abs: 1.4 10*3/uL (ref 0.7–4.0)
MCH: 29.5 pg (ref 26.0–34.0)
MCHC: 33 g/dL (ref 30.0–36.0)
MCV: 89.4 fL (ref 80.0–100.0)
Monocytes Absolute: 0.5 10*3/uL (ref 0.1–1.0)
Monocytes Relative: 11 %
Neutro Abs: 2.3 10*3/uL (ref 1.7–7.7)
Neutrophils Relative %: 49 %
Platelets: 284 10*3/uL (ref 150–400)
RBC: 3.49 MIL/uL — ABNORMAL LOW (ref 3.87–5.11)
RDW: 17.2 % — ABNORMAL HIGH (ref 11.5–15.5)
Smear Review: ADEQUATE
WBC: 4.7 10*3/uL (ref 4.0–10.5)
nRBC: 0 % (ref 0.0–0.2)

## 2019-10-23 LAB — COMPREHENSIVE METABOLIC PANEL
ALT: 42 U/L (ref 0–44)
AST: 33 U/L (ref 15–41)
Albumin: 4.1 g/dL (ref 3.5–5.0)
Alkaline Phosphatase: 71 U/L (ref 38–126)
Anion gap: 10 (ref 5–15)
BUN: 18 mg/dL (ref 6–20)
CO2: 23 mmol/L (ref 22–32)
Calcium: 9.1 mg/dL (ref 8.9–10.3)
Chloride: 108 mmol/L (ref 98–111)
Creatinine, Ser: 0.89 mg/dL (ref 0.44–1.00)
GFR calc Af Amer: >60 mL/min (ref >60)
GFR calc non Af Amer: >60 mL/min (ref >60)
Glucose, Bld: 122 mg/dL — ABNORMAL HIGH (ref 70–99)
Potassium: 4 mmol/L (ref 3.5–5.1)
Sodium: 141 mmol/L (ref 135–145)
Total Bilirubin: 0.6 mg/dL (ref 0.3–1.2)
Total Protein: 6.8 g/dL (ref 6.5–8.1)

## 2019-10-23 MED ORDER — FAMOTIDINE IN NACL 20-0.9 MG/50ML-% IV SOLN
20.0000 mg | Freq: Once | INTRAVENOUS | Status: AC
  Administered 2019-10-23: 20 mg via INTRAVENOUS
  Filled 2019-10-23: qty 50

## 2019-10-23 MED ORDER — ACETAMINOPHEN 325 MG PO TABS
650.0000 mg | ORAL_TABLET | Freq: Once | ORAL | Status: AC
  Administered 2019-10-23: 650 mg via ORAL
  Filled 2019-10-23: qty 2

## 2019-10-23 MED ORDER — HEPARIN SOD (PORK) LOCK FLUSH 100 UNIT/ML IV SOLN
500.0000 [IU] | Freq: Once | INTRAVENOUS | Status: AC
  Administered 2019-10-23: 500 [IU] via INTRAVENOUS
  Filled 2019-10-23: qty 5

## 2019-10-23 MED ORDER — SODIUM CHLORIDE 0.9 % IV SOLN
80.0000 mg/m2 | Freq: Once | INTRAVENOUS | Status: AC
  Administered 2019-10-23: 156 mg via INTRAVENOUS
  Filled 2019-10-23: qty 26

## 2019-10-23 MED ORDER — SODIUM CHLORIDE 0.9 % IV SOLN
Freq: Once | INTRAVENOUS | Status: AC
  Filled 2019-10-23: qty 250

## 2019-10-23 MED ORDER — DIPHENHYDRAMINE HCL 50 MG/ML IJ SOLN
25.0000 mg | Freq: Once | INTRAMUSCULAR | Status: AC
  Administered 2019-10-23: 25 mg via INTRAVENOUS
  Filled 2019-10-23: qty 1

## 2019-10-23 MED ORDER — SODIUM CHLORIDE 0.9 % IV SOLN
20.0000 mg | Freq: Once | INTRAVENOUS | Status: AC
  Administered 2019-10-23: 20 mg via INTRAVENOUS
  Filled 2019-10-23: qty 20

## 2019-10-23 MED ORDER — TRASTUZUMAB-ANNS CHEMO 150 MG IV SOLR
150.0000 mg | Freq: Once | INTRAVENOUS | Status: AC
  Administered 2019-10-23: 150 mg via INTRAVENOUS
  Filled 2019-10-23: qty 7.14

## 2019-10-23 MED ORDER — HEPARIN SOD (PORK) LOCK FLUSH 100 UNIT/ML IV SOLN
INTRAVENOUS | Status: AC
  Filled 2019-10-23: qty 5

## 2019-10-23 MED ORDER — SODIUM CHLORIDE 0.9% FLUSH
10.0000 mL | Freq: Once | INTRAVENOUS | Status: AC
  Administered 2019-10-23: 10 mL via INTRAVENOUS
  Filled 2019-10-23: qty 10

## 2019-10-23 NOTE — Progress Notes (Signed)
one [place] NOTE  Patient Care Team: Birdie Sons, MD as PCP - General (Family Medicine) [facility] Gyn (Gynecology) Theodore Demark, RN as Oncology Nurse Navigator Cammie Sickle, MD as Consulting Physician (Hematology and Oncology)  CHIEF COMPLAINTS/PURPOSE OF CONSULTATION: Breast cancer  #  Oncology History Overview Note  # MARCH 2021- RUOQ- 41m- [BREAST, RIGHT, LATERAL POSTERIOR  - INVASIVE MAMMARY CARCINOMA, NO SPECIAL TYPE, ASSOCIATED WITH  CALCIFICATIONS.][place] ER-> 90%; PR- 50-90%; HER 2 FISH- POSITIVE  # S/P LUMPECTOMY [Dr.Cintron] pT1a [422m;pN0 [STAGE IA- ER/PRpoS; Her 2 POS]  . BREAST, RIGHT, LATERAL MIDDLE DEPTH (RIBBON-SHAPED CLIP):  STEREOTACTIC-GUIDED CORE BIOPSY: - DUCTAL CARCINOMA IN SITU (DCIS), HIGH-GRADE, ASSOCIATED WITH  CALCIFICATIONS.   # May 14th,2021- TH-H  #Hypothyroidism-methimazole [Dr.Solum]  # SURVIVORSHIP:   # GENETICS:   DIAGNOSIS: Breast cancer  STAGE:   1      ;  GOALS: Cure  CURRENT/MOST RECENT THERAPY : Taxol Herceptin   Carcinoma of upper-outer quadrant of right breast in female, estrogen receptor positive ([facility] 07/15/2019 Initial Diagnosis   Carcinoma of upper-outer quadrant of right breast in female, estrogen receptor positive ([facility]  08/28/2019 -  Chemotherapy   The patient had PACLitaxel (TAXOL) 156 mg in sodium chloride 0.9 % 250 mL chemo infusion (</= 8051m2), 80 mg/m2 = 156 mg, Intravenous,  Once, 3 of 3 cycles Administration: 156 mg (08/28/2019), 156 mg (09/04/2019), 156 mg (09/25/2019), 156 mg (09/11/2019), 156 mg (09/18/2019), 156 mg (10/02/2019), 156 mg (10/09/2019), 156 mg (10/16/2019) trastuzumab-anns (KANJINTI) 336 mg in sodium chloride 0.9 % 250 mL chemo infusion, 4 mg/kg = 336 mg (100 % of original dose 4 mg/kg), Intravenous,  Once, 3 of 16 cycles Dose modification: 4 mg/kg (original dose 4 mg/kg, Cycle 1, Reason: Other (see comments), Comment: loading dose, biosimilar per insurance) Administration:  336 mg (08/28/2019), 150 mg (09/04/2019), 150 mg (09/25/2019), 150 mg (09/11/2019), 150 mg (09/18/2019), 150 mg (10/02/2019), 150 mg (10/09/2019), 150 mg (10/16/2019)  for chemotherapy treatment.      HISTORY OF PRESENTING ILLNESS:  Connie Bradford [age]o.  female with breast cancer early stage-ER/PR positive HER-2/neu positive on adjuvant chemotherapy.  Patient continues to have mild tingling and numbness in the extremities fingertips and toes.  Intermittent.  Not getting any worse.  Not interrupting her daily lifestyle.  No worsening skin rash.  No nausea vomiting headaches.  Review of Systems  Constitutional: Negative for chills, diaphoresis, fever, malaise/fatigue and weight loss.  HENT: Negative for nosebleeds and sore throat.   Eyes: Negative for double vision.  Respiratory: Negative for cough, hemoptysis, sputum production, shortness of breath and wheezing.   Cardiovascular: Negative for chest pain, palpitations, orthopnea and leg swelling.  Gastrointestinal: Negative for abdominal pain, blood in stool, constipation, diarrhea, heartburn, melena, nausea and vomiting.  Genitourinary: Negative for dysuria, frequency and urgency.  Musculoskeletal: Negative for back pain and joint pain.  Skin: Negative for itching.  Neurological: Positive for tingling. Negative for dizziness, focal weakness, weakness and headaches.  Endo/Heme/Allergies: Does not bruise/bleed easily.  Psychiatric/Behavioral: Negative for depression. The patient is not nervous/anxious and does not have insomnia.      MEDICAL HISTORY:  Past Medical History:  Diagnosis Date  . Atypical mole 09/26/2018   right mid anterior side/mild  . Basal cell carcinoma 08/25/2014   right med cheek infraorbital  . Breast cancer ([place]4/[place]   [facility]  . Dyslipidemia   . Family history of bladder cancer   . Graves  disease   . Hyperthyroidism   . Inborn lipid storage disorder   . Leiomyoma of uterus   . Vitamin D deficiency      SURGICAL HISTORY: Past Surgical History:  Procedure Laterality Date  . APPENDECTOMY  1982   Cyst removed from ovaries   . BREAST BIOPSY Right 07/05/2019   stereo bx, coil clip, IMC  . BREAST BIOPSY Right 07/05/2019   stereo bx, ribbon clip, DCIS   . BREAST LUMPECTOMY Right 07/31/2019    [facility] and DCIS with SN bx  . COLONOSCOPY WITH PROPOFOL N/A 01/20/2017   Procedure: COLONOSCOPY WITH PROPOFOL;  Surgeon: Jonathon Bellows, MD;  Location: [facility] ENDOSCOPY;  Service: Gastroenterology;  Laterality: N/A;  . [place]  . NECK SURGERY  2013   LIft from weight loss  . PARTIAL MASTECTOMY WITH NEEDLE LOCALIZATION AND AXILLARY SENTINEL LYMPH NODE BX Right 07/31/2019   Procedure: RIGHT PARTIAL MASTECTOMY WITH BRACKETED NEEDLE LOCALIZATION AND LEFT AXILLARY SENTINEL LYMPH NODE BIOPSY;  Surgeon: Herbert Pun, MD;  Location: [facility] ORS;  Service: General;  Laterality: Right;  . PORTACATH PLACEMENT Left 07/31/2019   Procedure: INSERTION PORT-A-CATH LEFT INTERNAL JUGULAR;  Surgeon: Herbert Pun, MD;  Location: [facility] ORS;  Service: General;  Laterality: Left;  . TONSILLECTOMY  1969    SOCIAL HISTORY: Social History   Socioeconomic History  . Marital status: Married    Spouse name: Not on file  . Number of children: 0  . Years of education: Anselm Lis  . Highest education level: Not on file  Occupational History  . Occupation: Full-Time    Comment: [employer] x9  Tobacco Use  . Smoking status: Former Research scientist (life sciences)  . Smokeless tobacco: Never Used  Vaping Use  . Vaping Use: Never used  Substance and Sexual Activity  . Alcohol use: Yes    Alcohol/week: 0.0 standard drinks    Comment: RARELY  . Drug use: No  . Sexual activity: Yes    Birth control/protection: Post-menopausal  Other Topics Concern  . Not on file  Social History Narrative   Lives close to [place]; with husband. Quit smoking in 2007 [25 years]; ocassional alcohol. 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    Social Determinants of Health   Financial Resource Strain:   . Difficulty of Paying Living Expenses:   Food Insecurity:   . Worried About Charity fundraiser in the Last Year:   . Arboriculturist in the Last Year:   Transportation Needs:   . Film/video editor (Medical):   Marland Kitchen Lack of Transportation (Non-Medical):   Physical Activity:   . Days of Exercise per Week:   . Minutes of Exercise per Session:   Stress:   . Feeling of Stress :   Social Connections:   . Frequency of Communication with Friends and Family:   . Frequency of Social Gatherings with Friends and Family:   . Attends Religious Services:   . Active Member of Clubs or Organizations:   . Attends Archivist Meetings:   Marland Kitchen Marital Status:   Intimate Partner Violence:   . Fear of Current or Ex-Partner:   . Emotionally Abused:   Marland Kitchen Physically Abused:   . Sexually Abused:     FAMILY HISTORY: Family History  Problem Relation Age of Onset  . Bladder Cancer Mother   . Diabetes Father   . Heart disease Father   . Bladder Cancer Brother   . Breast cancer Neg Hx  ALLERGIES:  is allergic to penicillins.  MEDICATIONS:  Current Outpatient Medications  Medication Sig Dispense Refill  . B COMPLEX VITAMINS PO Take 1 tablet by mouth daily.     . Cholecalciferol (VITAMIN D) 125 MCG (5000 UT) CAPS Take 5,000 Units by mouth daily.     . COLLAGEN PO Take 1 tablet by mouth daily. Super Collagen w/Biotin    . ibuprofen (ADVIL) 200 MG tablet Take 600 mg by mouth every 8 (eight) hours as needed (for pain.).     Marland Kitchen lidocaine-prilocaine (EMLA) cream Apply 1 application topically as needed. 30 g 0  . methimazole (TAPAZOLE) 5 MG tablet Take 5 mg by mouth daily.     . Multiple Vitamin (MULTIVITAMIN WITH MINERALS) TABS tablet Take 1 tablet by mouth daily.    . Omega 3-6-9 Fatty Acids (OMEGA 3-6-9 COMPLEX) CAPS Take 1 capsule by mouth daily.    . ondansetron (ZOFRAN) 8 MG tablet One pill every 8 hours as  needed for nausea/vomitting. 40 tablet 1  . prochlorperazine (COMPAZINE) 10 MG tablet Take 1 tablet (10 mg total) by mouth every 6 (six) hours as needed for nausea or vomiting. 40 tablet 1  . valACYclovir (VALTREX) 500 MG tablet Take 500 mg by mouth 2 (two) times a week.     . clindamycin (CLEOCIN-T) 1 % lotion Apply topically daily. (Patient not taking: Reported on 10/09/2019) 60 mL 11   No current facility-administered medications for this visit.   Facility-Administered Medications Ordered in Other Visits  Medication Dose Route Frequency Provider Last Rate Last Admin  . heparin lock flush 100 unit/mL  500 Units Intravenous Once [name] R, MD      . PACLitaxel (TAXOL) 156 mg in sodium chloride 0.9 % 250 mL chemo infusion (</= 73m/m2)  80 mg/m2 (Treatment Plan Recorded) Intravenous Once BCammie Sickle MD 276 mL/hr at 10/23/19 1144 156 mg at 10/23/19 1144    PHYSICAL EXAMINATION: ECOG PERFORMANCE STATUS: 0 - Asymptomatic  Vitals:   10/23/19 0845  BP: (!) 116/53  Pulse: 90  Resp: 16  Temp: 98.7 F (37.1 C)  SpO2: 99%   Filed Weights   10/23/19 0845  Weight: 179 lb (81.2 kg)    Physical Exam HENT:     Head: Normocephalic and atraumatic.     Mouth/Throat:     Pharynx: No oropharyngeal exudate.  Eyes:     Pupils: Pupils are equal, round, and reactive to light.  Cardiovascular:     Rate and Rhythm: Normal rate and regular rhythm.  Pulmonary:     Effort: Pulmonary effort is normal. No respiratory distress.     Breath sounds: Normal breath sounds. No wheezing.  Abdominal:     General: Bowel sounds are normal. There is no distension.     Palpations: Abdomen is soft. There is no mass.     Tenderness: There is no abdominal tenderness. There is no guarding or rebound.  Musculoskeletal:        General: No tenderness. Normal range of motion.     Cervical back: Normal range of motion and neck supple.  Skin:    General: Skin is warm.     Comments: Rash on  bilateral hands.   Neurological:     Mental Status: She is alert and oriented to person, place, and time.  Psychiatric:        Mood and Affect: Affect normal.    LABORATORY DATA:  I have reviewed the data as listed Lab Results  Component Value  Date   WBC 4.7 10/23/2019   HGB 10.3 (L) 10/23/2019   HCT 31.2 (L) 10/23/2019   MCV 89.4 10/23/2019   PLT 284 10/23/2019   Recent Labs    10/09/19 0759 10/16/19 0810 10/23/19 0836  NA 140 140 141  K 4.0 4.2 4.0  CL 106 109 108  CO2 23 21* 23  GLUCOSE 143* 107* 122*  BUN '19 20 18  ' CREATININE 0.74 0.80 0.89  CALCIUM 9.3 9.4 9.1  GFRNONAA >60 >60 >60  GFRAA >60 >60 >60  PROT 7.0 6.8 6.8  ALBUMIN 4.1 3.9 4.1  AST 31 31 33  ALT 42 44 42  ALKPHOS 70 67 71  BILITOT 0.8 0.7 0.6    RADIOGRAPHIC STUDIES: I have personally reviewed the radiological images as listed and agreed with the findings in the report. No results found. ASSESSMENT & PLAN:   Carcinoma of upper-outer quadrant of right breast in female, estrogen receptor positive ([place]) #Right breast-invasive mammary carcinoma- S/P LUMPECTOMY pT1a [36m];pN0 [STAGE IA- ER/PRpoS; Her 2 POS]; STAGE I. On adjuvant Taxol-Herceptin.  Stable.  # Proceed with cycle #3-day-1 today. Labs today reviewed;  acceptable for treatment today.   # Slightly elevated AST ALT x 2 times times normal limits-currently normal limits.  Stable.  # Fatigue-grade 1; secondary chemotherapy stable  # SWOG study/PN study- PN- G-1.  Stable monitor for now.  #Overall good prognosis-was discussed with patient.  Would not recommend surveillance imaging.  # Disposition:  # referral to dr.Crystal re: breast cancer # Chemo today #  1 week- labs-cbc/bmp/taxol-hercerptin #  follow up in 2 weeks- MD; labs- cbc/cmp; weekly taxol-Herceptin- Dr.B  Cc: Dr.Cintron  All questions were answered. The patient/family knows to call the clinic with any problems, questions or concerns.    GCammie Sickle MD 10/23/2019  11:58 AM

## 2019-10-23 NOTE — Research (Signed)
Patient Connie Bradford presents to clinic this morning for her scheduled infusion of Taxol + Herceptin, which will be Cycle 3, Day 1 or infusion #9. Solicited Neuropathy events were assessed and patient reports experiencing numbness and tingling in the tips of her fingers as well as her toes that is transient, but also painful at times. She denies experiencing any difficulty performing ADLs and states she is not having problems with fine motor skills such as buttoning her blouse. She also denies experiencing any problems with her balance of ambulation. States the onset of neuropathy was the day following her last study assessment 4 weeks ago. She began her chemotherapy 8 weeks ago now, and continues to report some mild fatigue that peaks about 3 days after her infusions, then gradually subsides. Dr. Rogue Bussing continues to recommend exercise to combat this. Questioned patient about any ancillary therapy she has used for her neuropathy and she denies having used acupuncture, cooling or compression gloves/socks, but does state she has an appointment for a massage tomorrow and plans to have them concentrate on her areas of neuropathy in her fingers & toes to see if this helps. Ms. Sperry completed the week 8 study questionnaires including: PROMIS 29, GSLPTAQ, EORTC QLQ-CIPN20, PRO-CTCAE and Follow up Patient Symptom burden questionnaire. Dr. Rogue Bussing performed H&P and completed the Follow up Physician Toxicity Burden assessment. Weight and VS were assessed prior to MD performing H&P. Patient confirms she is right hand and right foot dominant. Neuropen and Tuning fork assessments were also completed by this RN with assistance of Jeral Fruit, RN for the timed assessments. Patient denies any alternative treatments for neuropathy because she is not experiencing this at all. Solicited and other adverse events with grade and attribution as note below:   Solicited Adverse Event Log  Study/Protocol: SWOG [id number] Cycle:  Week 8 Event Grade Onset Date Resolved Date Drug Name Attribution Treatment Comments  Dysesthesia 0        Neuralgia 1 09/26/2019  Taxol definitely None yet   Paresthesia 0        Peripheral Motor Neuropathy 0        Peripheral Sensory Neuropathy 1 09/26/2019  Taxol definitely None yet   Fatigue (not a solicited AE) 1 08/21/9739  Taxol+Herceptin definitely exercise   Alopecia (not solicited) 2 09/19/8451  Taxol definitely    [name], BSN, MHA, OCN 10/23/2019 10:56 AM

## 2019-10-23 NOTE — Assessment & Plan Note (Addendum)
#  Right breast-invasive mammary carcinoma- S/P LUMPECTOMY pT1a [31mm];pN0 [STAGE IA- ER/PRpoS; Her 2 POS]; STAGE I. On adjuvant Taxol-Herceptin.  Stable.  # Proceed with cycle #3-day-1 today. Labs today reviewed;  acceptable for treatment today.   # Slightly elevated AST ALT x 2 times times normal limits-currently normal limits.  Stable.  # Fatigue-grade 1; secondary chemotherapy stable  # SWOG study/PN study- PN- G-1.  Stable monitor for now.  #Overall good prognosis-was discussed with patient.  Would not recommend surveillance imaging.  # Disposition:  # referral to dr.Crystal re: breast cancer # Chemo today #  1 week- labs-cbc/bmp/taxol-hercerptin #  follow up in 2 weeks- MD; labs- cbc/cmp; weekly taxol-Herceptin- Dr.B  Cc: Dr.Cintron

## 2019-10-30 ENCOUNTER — Inpatient Hospital Stay: Payer: 59

## 2019-10-30 ENCOUNTER — Other Ambulatory Visit: Payer: Self-pay

## 2019-10-30 ENCOUNTER — Encounter: Payer: Self-pay | Admitting: Internal Medicine

## 2019-10-30 VITALS — BP 124/70 | HR 88 | Temp 97.6°F | Resp 18 | Wt 177.6 lb

## 2019-10-30 DIAGNOSIS — C50411 Malignant neoplasm of upper-outer quadrant of right female breast: Secondary | ICD-10-CM

## 2019-10-30 DIAGNOSIS — Z170 Estrogen receptor positive status [ER+]: Secondary | ICD-10-CM

## 2019-10-30 DIAGNOSIS — Z5111 Encounter for antineoplastic chemotherapy: Secondary | ICD-10-CM | POA: Diagnosis not present

## 2019-10-30 LAB — COMPREHENSIVE METABOLIC PANEL
ALT: 36 U/L (ref 0–44)
AST: 29 U/L (ref 15–41)
Albumin: 4 g/dL (ref 3.5–5.0)
Alkaline Phosphatase: 72 U/L (ref 38–126)
Anion gap: 9 (ref 5–15)
BUN: 18 mg/dL (ref 6–20)
CO2: 25 mmol/L (ref 22–32)
Calcium: 9.3 mg/dL (ref 8.9–10.3)
Chloride: 105 mmol/L (ref 98–111)
Creatinine, Ser: 0.82 mg/dL (ref 0.44–1.00)
GFR calc Af Amer: >60 mL/min (ref >60)
GFR calc non Af Amer: >60 mL/min (ref >60)
Glucose, Bld: 114 mg/dL — ABNORMAL HIGH (ref 70–99)
Potassium: 3.9 mmol/L (ref 3.5–5.1)
Sodium: 139 mmol/L (ref 135–145)
Total Bilirubin: 0.7 mg/dL (ref 0.3–1.2)
Total Protein: 6.9 g/dL (ref 6.5–8.1)

## 2019-10-30 LAB — CBC WITH DIFFERENTIAL/PLATELET
Abs Immature Granulocytes: 0.25 10*3/uL — ABNORMAL HIGH (ref 0.00–0.07)
Basophils Absolute: 0.1 10*3/uL (ref 0.0–0.1)
Basophils Relative: 2 %
Eosinophils Absolute: 0.2 10*3/uL (ref 0.0–0.5)
Eosinophils Relative: 3 %
HCT: 32.6 % — ABNORMAL LOW (ref 36.0–46.0)
Hemoglobin: 10.6 g/dL — ABNORMAL LOW (ref 12.0–15.0)
Immature Granulocytes: 5 %
Lymphocytes Relative: 27 %
Lymphs Abs: 1.3 10*3/uL (ref 0.7–4.0)
MCH: 29.4 pg (ref 26.0–34.0)
MCHC: 32.5 g/dL (ref 30.0–36.0)
MCV: 90.3 fL (ref 80.0–100.0)
Monocytes Absolute: 0.6 10*3/uL (ref 0.1–1.0)
Monocytes Relative: 12 %
Neutro Abs: 2.4 10*3/uL (ref 1.7–7.7)
Neutrophils Relative %: 51 %
Platelets: 291 10*3/uL (ref 150–400)
RBC: 3.61 MIL/uL — ABNORMAL LOW (ref 3.87–5.11)
RDW: 16.8 % — ABNORMAL HIGH (ref 11.5–15.5)
WBC: 4.7 10*3/uL (ref 4.0–10.5)
nRBC: 0 % (ref 0.0–0.2)

## 2019-10-30 MED ORDER — SODIUM CHLORIDE 0.9 % IV SOLN
80.0000 mg/m2 | Freq: Once | INTRAVENOUS | Status: AC
  Administered 2019-10-30: 156 mg via INTRAVENOUS
  Filled 2019-10-30: qty 26

## 2019-10-30 MED ORDER — SODIUM CHLORIDE 0.9% FLUSH
10.0000 mL | INTRAVENOUS | Status: DC | PRN
  Administered 2019-10-30: 10 mL
  Filled 2019-10-30: qty 10

## 2019-10-30 MED ORDER — HEPARIN SOD (PORK) LOCK FLUSH 100 UNIT/ML IV SOLN
500.0000 [IU] | Freq: Once | INTRAVENOUS | Status: AC | PRN
  Administered 2019-10-30: 500 [IU]
  Filled 2019-10-30: qty 5

## 2019-10-30 MED ORDER — FAMOTIDINE IN NACL 20-0.9 MG/50ML-% IV SOLN
20.0000 mg | Freq: Once | INTRAVENOUS | Status: AC
  Administered 2019-10-30: 20 mg via INTRAVENOUS
  Filled 2019-10-30: qty 50

## 2019-10-30 MED ORDER — SODIUM CHLORIDE 0.9 % IV SOLN
20.0000 mg | Freq: Once | INTRAVENOUS | Status: AC
  Administered 2019-10-30: 20 mg via INTRAVENOUS
  Filled 2019-10-30: qty 20

## 2019-10-30 MED ORDER — SODIUM CHLORIDE 0.9 % IV SOLN
Freq: Once | INTRAVENOUS | Status: AC
  Filled 2019-10-30: qty 250

## 2019-10-30 MED ORDER — TRASTUZUMAB-ANNS CHEMO 150 MG IV SOLR
150.0000 mg | Freq: Once | INTRAVENOUS | Status: AC
  Administered 2019-10-30: 150 mg via INTRAVENOUS
  Filled 2019-10-30: qty 7.14

## 2019-10-30 MED ORDER — ACETAMINOPHEN 325 MG PO TABS
650.0000 mg | ORAL_TABLET | Freq: Once | ORAL | Status: AC
  Administered 2019-10-30: 650 mg via ORAL
  Filled 2019-10-30: qty 2

## 2019-10-30 MED ORDER — HEPARIN SOD (PORK) LOCK FLUSH 100 UNIT/ML IV SOLN
INTRAVENOUS | Status: AC
  Filled 2019-10-30: qty 5

## 2019-10-30 MED ORDER — DIPHENHYDRAMINE HCL 50 MG/ML IJ SOLN
25.0000 mg | Freq: Once | INTRAMUSCULAR | Status: AC
  Administered 2019-10-30: 25 mg via INTRAVENOUS
  Filled 2019-10-30: qty 1

## 2019-10-31 ENCOUNTER — Ambulatory Visit
Admission: RE | Admit: 2019-10-31 | Discharge: 2019-10-31 | Disposition: A | Discharge status: Home / Self Care | Payer: 59 | Source: Ambulatory Visit | Attending: Radiation Oncology | Admitting: Radiation Oncology

## 2019-10-31 ENCOUNTER — Encounter: Payer: Self-pay | Admitting: Radiation Oncology

## 2019-10-31 ENCOUNTER — Inpatient Hospital Stay (HOSPITAL_BASED_OUTPATIENT_CLINIC_OR_DEPARTMENT_OTHER): Payer: 59 | Admitting: Hospice and Palliative Medicine

## 2019-10-31 VITALS — BP 150/76 | HR 79 | Temp 97.5°F | Resp 20 | Ht 67.0 in | Wt 178.0 lb

## 2019-10-31 VITALS — BP 155/82 | HR 69 | Temp 96.6°F | Resp 16 | Wt 179.3 lb

## 2019-10-31 DIAGNOSIS — Z8052 Family history of malignant neoplasm of bladder: Secondary | ICD-10-CM | POA: Insufficient documentation

## 2019-10-31 DIAGNOSIS — C50411 Malignant neoplasm of upper-outer quadrant of right female breast: Secondary | ICD-10-CM | POA: Diagnosis not present

## 2019-10-31 DIAGNOSIS — R21 Rash and other nonspecific skin eruption: Secondary | ICD-10-CM | POA: Diagnosis not present

## 2019-10-31 DIAGNOSIS — Z5111 Encounter for antineoplastic chemotherapy: Secondary | ICD-10-CM | POA: Diagnosis not present

## 2019-10-31 DIAGNOSIS — Z170 Estrogen receptor positive status [ER+]: Secondary | ICD-10-CM | POA: Insufficient documentation

## 2019-10-31 DIAGNOSIS — Z79899 Other long term (current) drug therapy: Secondary | ICD-10-CM | POA: Insufficient documentation

## 2019-10-31 DIAGNOSIS — H539 Unspecified visual disturbance: Secondary | ICD-10-CM | POA: Diagnosis not present

## 2019-10-31 DIAGNOSIS — E559 Vitamin D deficiency, unspecified: Secondary | ICD-10-CM | POA: Insufficient documentation

## 2019-10-31 DIAGNOSIS — E785 Hyperlipidemia, unspecified: Secondary | ICD-10-CM | POA: Insufficient documentation

## 2019-10-31 DIAGNOSIS — E0500 Thyrotoxicosis with diffuse goiter without thyrotoxic crisis or storm: Secondary | ICD-10-CM | POA: Insufficient documentation

## 2019-10-31 DIAGNOSIS — Z85828 Personal history of other malignant neoplasm of skin: Secondary | ICD-10-CM | POA: Insufficient documentation

## 2019-10-31 DIAGNOSIS — Z87891 Personal history of nicotine dependence: Secondary | ICD-10-CM | POA: Insufficient documentation

## 2019-10-31 NOTE — Progress Notes (Signed)
Phone call placed to [facility], where patient is an established patient. Msg given to triage nurse at the [facility], who will reach out to patient to set up an apt with ophthalmology.

## 2019-10-31 NOTE — Consult Note (Signed)
NEW PATIENT EVALUATION  Name: Connie Bradford  MRN: [id number]  Date:   10/31/2019     DOB: April 13, 1961   This 59 y.o. female patient presents to the clinic for initial evaluation of stage Ia (T1 a N0 M0) ER PR positive HER-2/neu overexpressed right breast cancer status post wide local excision and adjuvant chemotherapy.  REFERRING PHYSICIAN: Birdie Sons, MD  CHIEF COMPLAINT:  Chief Complaint  Patient presents with  . Breast Cancer    Initial consultation    DIAGNOSIS: The encounter diagnosis was Carcinoma of upper-outer quadrant of right breast in female, estrogen receptor positive ([place]).   PREVIOUS INVESTIGATIONS:  Mammogram and ultrasound reviewed Clinical notes reviewed Pathology report reviewed  HPI: Patient is a 59 year old female who presented with an abnormal mammogram of her right breast showing calcifications warranting further evaluation.  This was confirmed on ultrasound showing a 0.7 cm group of calcifications with associated mass density in the outer right breast.  Stereotactic guided biopsy was performed showing invasive mammary carcinoma with associated ductal carcinoma in situ high-grade.  Tumor was ER/PR positive HER-2/neu overexpressed by FISH.  She would not have a wide local excision for a 4 mm area of an overall grade 2 invasive mammary carcinoma with margins clear at 4 mm for invasive component 5 mm for the DCIS.  2 sentinel lymph nodes were negative for metastatic disease.  No lymph vascular invasion was noted.  Patient is currently undergoing Taxol chemotherapy as well as Herceptin which she is tolerating well.  She has another 2 cycles to go.  She specifically denies breast tenderness cough or bone pain.  She is a little area of ulceration of breast which is healing at this time from the blue dye for sentinel node biopsy.  She specifically denies bone pain cough.  PLANNED TREATMENT REGIMEN: Right breast hypofractionated treatment  PAST MEDICAL HISTORY:  has a  past medical history of Atypical mole (09/26/2018), Basal cell carcinoma (08/25/2014), Breast cancer ([place]) (07/2019), Dyslipidemia, Family history of bladder cancer, Graves disease, Hyperthyroidism, Inborn lipid storage disorder, Leiomyoma of uterus, and Vitamin D deficiency.    PAST SURGICAL HISTORY:  Past Surgical History:  Procedure Laterality Date  . APPENDECTOMY  1982   Cyst removed from ovaries   . BREAST BIOPSY Right 07/05/2019   stereo bx, coil clip, IMC  . BREAST BIOPSY Right 07/05/2019   stereo bx, ribbon clip, DCIS   . BREAST LUMPECTOMY Right 07/31/2019    [facility] and DCIS with SN bx  . COLONOSCOPY WITH PROPOFOL N/A 01/20/2017   Procedure: COLONOSCOPY WITH PROPOFOL;  Surgeon: Jonathon Bellows, MD;  Location[facility] ENDOSCOPY;  Service: Gastroenterology;  Laterality: N/A;  . [place]  . NECK SURGERY  2013   LIft from weight loss  . PARTIAL MASTECTOMY WITH NEEDLE LOCALIZATION AND AXILLARY SENTINEL LYMPH NODE BX Right 07/31/2019   Procedure: RIGHT PARTIAL MASTECTOMY WITH BRACKETED NEEDLE LOCALIZATION AND LEFT AXILLARY SENTINEL LYMPH NODE BIOPSY;  Surgeon: Herbert Pun, MD;  Location: [facility] ORS;  Service: General;  Laterality: Right;  . PORTACATH PLACEMENT Left 07/31/2019   Procedure: INSERTION PORT-A-CATH LEFT INTERNAL JUGULAR;  Surgeon: Herbert Pun, MD;  Location: [facility] ORS;  Service: General;  Laterality: Left;  . TONSILLECTOMY  1969    FAMILY HISTORY: family history includes Bladder Cancer in her brother and mother; Diabetes in her father; Heart disease in her father.  SOCIAL HISTORY:  reports that she has quit smoking. She has never used smokeless tobacco. She reports current alcohol  use. She reports that she does not use drugs.  ALLERGIES: Penicillins  MEDICATIONS:  Current Outpatient Medications  Medication Sig Dispense Refill  . B COMPLEX VITAMINS PO Take 1 tablet by mouth daily.     . Cholecalciferol (VITAMIN D) 125 MCG (5000 UT) CAPS  Take 5,000 Units by mouth daily.     . COLLAGEN PO Take 1 tablet by mouth daily. Super Collagen w/Biotin    . ibuprofen (ADVIL) 200 MG tablet Take 600 mg by mouth every 8 (eight) hours as needed (for pain.).     Marland Kitchen lidocaine-prilocaine (EMLA) cream Apply 1 application topically as needed. 30 g 0  . methimazole (TAPAZOLE) 5 MG tablet Take 5 mg by mouth daily.     . Multiple Vitamin (MULTIVITAMIN WITH MINERALS) TABS tablet Take 1 tablet by mouth daily.    . Omega 3-6-9 Fatty Acids (OMEGA 3-6-9 COMPLEX) CAPS Take 1 capsule by mouth daily.    . ondansetron (ZOFRAN) 8 MG tablet One pill every 8 hours as needed for nausea/vomitting. 40 tablet 1  . prochlorperazine (COMPAZINE) 10 MG tablet Take 1 tablet (10 mg total) by mouth every 6 (six) hours as needed for nausea or vomiting. 40 tablet 1  . valACYclovir (VALTREX) 500 MG tablet Take 500 mg by mouth 2 (two) times a week.     . clindamycin (CLEOCIN-T) 1 % lotion Apply topically daily. (Patient not taking: Reported on 10/31/2019) 60 mL 11   No current facility-administered medications for this encounter.    ECOG PERFORMANCE STATUS:  0 - Asymptomatic  REVIEW OF SYSTEMS: Patient denies any weight loss, fatigue, weakness, fever, chills or night sweats. Patient denies any loss of vision, blurred vision. Patient denies any ringing  of the ears or hearing loss. No irregular heartbeat. Patient denies heart murmur or history of fainting. Patient denies any chest pain or pain radiating to her upper extremities. Patient denies any shortness of breath, difficulty breathing at night, cough or hemoptysis. Patient denies any swelling in the lower legs. Patient denies any nausea vomiting, vomiting of blood, or coffee ground material in the vomitus. Patient denies any stomach pain. Patient states has had normal bowel movements no significant constipation or diarrhea. Patient denies any dysuria, hematuria or significant nocturia. Patient denies any problems walking, swelling  in the joints or loss of balance. Patient denies any skin changes, loss of hair or loss of weight. Patient denies any excessive worrying or anxiety or significant depression. Patient denies any problems with insomnia. Patient denies excessive thirst, polyuria, polydipsia. Patient denies any swollen glands, patient denies easy bruising or easy bleeding. Patient denies any recent infections, allergies or URI. Patient "s visual fields have not changed significantly in recent time.   PHYSICAL EXAM: BP (!) 155/82 (BP Location: Left Arm, Patient Position: Sitting)   Pulse 69   Temp (!) 96.6 F (35.9 C) (Tympanic)   Resp 16   Wt 179 lb 4.8 oz (81.3 kg)   LMP 12/18/2013   BMI 28.08 kg/m  Right breast as well healed wide local excision scar.  No dominant mass or nodularity is noted in either breast in 2 positions examined.  No axillary or supraclavicular adenopathy is identified.  Well-developed well-nourished patient in NAD. HEENT reveals PERLA, EOMI, discs not visualized.  Oral cavity is clear. No oral mucosal lesions are identified. Neck is clear without evidence of cervical or supraclavicular adenopathy. Lungs are clear to A&P. Cardiac examination is essentially unremarkable with regular rate and rhythm without murmur rub or thrill.  Abdomen is benign with no organomegaly or masses noted. Motor sensory and DTR levels are equal and symmetric in the upper and lower extremities. Cranial nerves II through XII are grossly intact. Proprioception is intact. No peripheral adenopathy or edema is identified. No motor or sensory levels are noted. Crude visual fields are within normal range.  LABORATORY DATA: Pathology report reviewed    RADIOLOGY RESULTS: Mammogram ultrasound reviewed compatible with above-stated findings   IMPRESSION: Stage I triple positive invasive mammary carcinoma the right breast undergoing adjuvant chemotherapy for whole breast radiation in 59 year old female  PLAN: At this time I  believe I can treat her the patient over 3 weeks with hypofractionated course of radiation therapy to the right breast.  Would also boost her scar another 1000 cGy in 5 fractions using electron beam.  Risks and benefits of treatment including skin reaction fatigue alteration of blood counts possible occlusion of superficial lung all were described in detail to the patient.  She seems to comprehend my treatment plan well.  Patient also will benefit from antiestrogen therapy after completion of radiation therapy as well as continuation of Herceptin.  I have personally set and ordered CT simulation in about 3 weeks to allow her to complete her chemotherapy and have a slight break.  She is also planning a vacation around that time.  Patient comprehends my treatment plan well.  I would like to take this opportunity to thank you for allowing me to participate in the care of your patient.Noreene Filbert, MD

## 2019-10-31 NOTE — Progress Notes (Signed)
Symptom Management [place]  Telephone:(336) [phone] [phone]) [phone]  Patient Care Team: Birdie Sons, MD as PCP - General (Family Medicine) [facility] Gyn (Gynecology) Theodore Demark, RN as Oncology Nurse Navigator Cammie Sickle, MD as Consulting Physician (Hematology and Oncology)   Name of the patient: Connie Bradford  [id number]  May 30, 1960   Date of visit: 10/31/19  Reason for Consult: Ms. Zaliyah Meikle is a 59 year old woman with multiple medical problems including stage Ia invasive mammary carcinoma of the right breast (diagnosed 06/2019) status post lumpectomy (07/31/2019) on treatment with adjuvant chemotherapy with Taxol/Herceptin and who has been referred to Dr. Baruch Gouty for consideration of XRT.    Patient presents to [facility] today for evaluation of visual changes.  She reports several days of an intermittent bright line in her lower visual fields.  This occurred bilaterally. The bright line is described as jagged.  It lasts for a few minutes and then dissipates.  She has also had floaters in both eyes.  Patient also says that she has had dry eyes recently and has been using over-the-counter artificial tears.  She denies double vision.  Patient denies history of migraines but has had previous headaches.  She denies fever or chills.  No nasal congestion or recent infections.  She has had some occasional dizziness over the past few weeks which occurs irrespective of position changes or head movement.  She denies any unilateral weakness.  No speech or swallowing issues.  No changes in smell or sensation.  Denies recent fevers or illnesses. Denies any easy bleeding or bruising. Reports good appetite and denies weight loss. Denies chest pain. Denies any nausea, vomiting, constipation, or diarrhea. Denies urinary complaints. Patient offers no further specific complaints today.  Patient is established at [facility] but says that she is due  for an exam and has not seen them in 2 years.  PAST MEDICAL HISTORY: Past Medical History:  Diagnosis Date  . Atypical mole 09/26/2018   right mid anterior side/mild  . Basal cell carcinoma 08/25/2014   right med cheek infraorbital  . Breast cancer ([place]) 07/2019   [place] and DCIS  . Dyslipidemia   . Family history of bladder cancer   . Graves disease   . Hyperthyroidism   . Inborn lipid storage disorder   . Leiomyoma of uterus   . Vitamin D deficiency     PAST SURGICAL HISTORY:  Past Surgical History:  Procedure Laterality Date  . APPENDECTOMY  1982   Cyst removed from ovaries   . BREAST BIOPSY Right 07/05/2019   stereo bx, coil clip, IMC  . BREAST BIOPSY Right 07/05/2019   stereo bx, ribbon clip, DCIS   . BREAST LUMPECTOMY Right 07/31/2019    [facility] and DCIS with SN bx  . COLONOSCOPY WITH PROPOFOL N/A 01/20/2017   Procedure: COLONOSCOPY WITH PROPOFOL;  Surgeon: Jonathon Bellows, MD;  Location: [facility] ENDOSCOPY;  Service: Gastroenterology;  Laterality: N/A;  . [place]  . NECK SURGERY  2013   LIft from weight loss  . PARTIAL MASTECTOMY WITH NEEDLE LOCALIZATION AND AXILLARY SENTINEL LYMPH NODE BX Right 07/31/2019   Procedure: RIGHT PARTIAL MASTECTOMY WITH BRACKETED NEEDLE LOCALIZATION AND LEFT AXILLARY SENTINEL LYMPH NODE BIOPSY;  Surgeon: Herbert Pun, MD;  Location: [facility] ORS;  Service: General;  Laterality: Right;  . PORTACATH PLACEMENT Left 07/31/2019   Procedure: INSERTION PORT-A-CATH LEFT INTERNAL JUGULAR;  Surgeon: Herbert Pun, MD;  Location: [facility] ORS;  Service: General;  Laterality: Left;  . TONSILLECTOMY  1969    HEMATOLOGY/ONCOLOGY HISTORY:  Oncology History Overview Note  # MARCH 2021- RUOQ- 21mm- [BREAST, RIGHT, LATERAL POSTERIOR  - INVASIVE MAMMARY CARCINOMA, NO SPECIAL TYPE, ASSOCIATED WITH  CALCIFICATIONS.[place]; ER-> 90%; PR- 50-90%; HER 2 FISH- POSITIVE  # S/P LUMPECTOMY [Dr.Cintron] pT1a [93mm];pN0 [STAGE IA- ER/PRpoS; Her 2  POS]  . BREAST, RIGHT, LATERAL MIDDLE DEPTH (RIBBON-SHAPED CLIP):  STEREOTACTIC-GUIDED CORE BIOPSY: - DUCTAL CARCINOMA IN SITU (DCIS), HIGH-GRADE, ASSOCIATED WITH  CALCIFICATIONS.   # May 14th,2021- [facility]  #Hypothyroidism-methimazole [Dr.Solum]  # SURVIVORSHIP:   # GENETICS:   DIAGNOSIS: Breast cancer  STAGE:   1      ;  GOALS: Cure  CURRENT/MOST RECENT THERAPY : Taxol Herceptin   Carcinoma of upper-outer quadrant of right breast in female, estrogen receptor positive ([place])  07/15/2019 Initial Diagnosis   Carcinoma of upper-outer quadrant of right breast in female, estrogen receptor positive ([place])   08/28/2019 -  Chemotherapy   The patient had PACLitaxel (TAXOL) 156 mg in sodium chloride 0.9 % 250 mL chemo infusion (</= 80mg /m2), 80 mg/m2 = 156 mg, Intravenous,  Once, 3 of 3 cycles Administration: 156 mg (08/28/2019), 156 mg (09/04/2019), 156 mg (09/25/2019), 156 mg (09/11/2019), 156 mg (09/18/2019), 156 mg (10/02/2019), 156 mg (10/09/2019), 156 mg (10/16/2019), 156 mg (10/23/2019), 156 mg (10/30/2019) trastuzumab-anns (KANJINTI) 336 mg in sodium chloride 0.9 % 250 mL chemo infusion, 4 mg/kg = 336 mg (100 % of original dose 4 mg/kg), Intravenous,  Once, 3 of 16 cycles Dose modification: 4 mg/kg (original dose 4 mg/kg, Cycle 1, Reason: Other (see comments), Comment: loading dose, biosimilar per insurance) Administration: 336 mg (08/28/2019), 150 mg (09/04/2019), 150 mg (09/25/2019), 150 mg (09/11/2019), 150 mg (09/18/2019), 150 mg (10/02/2019), 150 mg (10/09/2019), 150 mg (10/16/2019), 150 mg (10/23/2019), 150 mg (10/30/2019)  for chemotherapy treatment.      ALLERGIES:  is allergic to penicillins.  MEDICATIONS:  Current Outpatient Medications  Medication Sig Dispense Refill  . B COMPLEX VITAMINS PO Take 1 tablet by mouth daily.     . Cholecalciferol (VITAMIN D) 125 MCG (5000 UT) CAPS Take 5,000 Units by mouth daily.     . clindamycin (CLEOCIN-T) 1 % lotion Apply topically daily. (Patient not taking:  Reported on 10/09/2019) 60 mL 11  . COLLAGEN PO Take 1 tablet by mouth daily. Super Collagen w/Biotin    . ibuprofen (ADVIL) 200 MG tablet Take 600 mg by mouth every 8 (eight) hours as needed (for pain.).     Marland Kitchen lidocaine-prilocaine (EMLA) cream Apply 1 application topically as needed. 30 g 0  . methimazole (TAPAZOLE) 5 MG tablet Take 5 mg by mouth daily.     . Multiple Vitamin (MULTIVITAMIN WITH MINERALS) TABS tablet Take 1 tablet by mouth daily.    . Omega 3-6-9 Fatty Acids (OMEGA 3-6-9 COMPLEX) CAPS Take 1 capsule by mouth daily.    . ondansetron (ZOFRAN) 8 MG tablet One pill every 8 hours as needed for nausea/vomitting. 40 tablet 1  . prochlorperazine (COMPAZINE) 10 MG tablet Take 1 tablet (10 mg total) by mouth every 6 (six) hours as needed for nausea or vomiting. 40 tablet 1  . valACYclovir (VALTREX) 500 MG tablet Take 500 mg by mouth 2 (two) times a week.      No current facility-administered medications for this visit.    VITAL SIGNS: Temp (!) 97.5 F (36.4 C) (Tympanic)   Resp 20   Ht 5\' 7"  (1.702 m)   Wt 178 lb (  80.7 kg)   LMP 12/18/2013   BMI 27.88 kg/m  Filed Weights   10/31/19 0936  Weight: 178 lb (80.7 kg)    Estimated body mass index is 27.88 kg/m as calculated from the following:   Height as of this encounter: 5\' 7"  (1.702 m).   Weight as of this encounter: 178 lb (80.7 kg).  LABS: CBC:    Component Value Date/Time   WBC 4.7 10/30/2019 0832   HGB 10.6 (L) 10/30/2019 0832   HCT 32.6 (L) 10/30/2019 0832   PLT 291 10/30/2019 0832   MCV 90.3 10/30/2019 0832   NEUTROABS 2.4 10/30/2019 0832   LYMPHSABS 1.3 10/30/2019 0832   MONOABS 0.6 10/30/2019 0832   EOSABS 0.2 10/30/2019 0832   BASOSABS 0.1 10/30/2019 0832   Comprehensive Metabolic Panel:    Component Value Date/Time   NA 139 10/30/2019 0832   K 3.9 10/30/2019 0832   CL 105 10/30/2019 0832   CO2 25 10/30/2019 0832   BUN 18 10/30/2019 0832   CREATININE 0.82 10/30/2019 0832   GLUCOSE 114 (H)  10/30/2019 0832   CALCIUM 9.3 10/30/2019 0832   AST 29 10/30/2019 0832   AST 31 12/17/2013 0000   ALT 36 10/30/2019 0832   ALT 29 12/17/2013 0000   ALKPHOS 72 10/30/2019 0832   BILITOT 0.7 10/30/2019 0832   PROT 6.9 10/30/2019 0832   ALBUMIN 4.0 10/30/2019 0832    RADIOGRAPHIC STUDIES: No results found.  PERFORMANCE STATUS (ECOG) : 1 - Symptomatic but completely ambulatory  Review of Systems Unless otherwise noted, a complete review of systems is negative.  Physical Exam General: NAD HEENT: No scleral injection or erythema, pupils equal round and reactive bilaterally, peripheral ocular fields intact, EOM intact Cardiovascular: regular rate and rhythm Pulmonary: clear ant fields Abdomen: soft, nontender, + bowel sounds GU: no suprapubic tenderness Extremities: no edema, no joint deformities Skin: Erythematous rash bilateral hands, dry hands Neurological: CN II through XII grossly intact, negative Romberg, cerebellar function intact      Assessment and Plan- Patient is a 59 y.o. female with stage Ia breast cancer status post lumpectomy on adjuvant chemotherapy who presents the clinic for evaluation of visual changes.  Visual changes -etiology unclear.  Doubt retinal tear given bilateral and transient nature.  Could be ocular migraines.  Discussed with Dr. Rogue Bussing who does not think that patient requires CT scan as symptoms are unlikely to be related to her cancer or treatment.  Will refer to ophthalmology.  Ophthalmology office was contacted and are expediting referral.   Rash -on bilateral hands present since she started treatment.  This is likely secondary to her chemotherapy.  She is using hydrocortisone cream.  Hands also appear dry.  Discussed use of an emollient moisturizing lotion.  Case and plan discussed with Dr. Rogue Bussing.  Referral sent for ophthalmology.  RTC here as needed.   Patient expressed understanding and was in agreement with this plan. She also  understands that She can call clinic at any time with any questions, concerns, or complaints.   Thank you for allowing me to participate in the care of this very pleasant patient.   Time Total: 20 minutes  Visit consisted of counseling and education dealing with the complex and emotionally intense issues of symptom management and palliative care in the setting of serious and potentially life-threatening illness.Greater than 50%  of this time was spent counseling and coordinating care related to the above assessment and plan.  Signed by: Altha Harm, PhD, NP-C

## 2019-10-31 NOTE — Progress Notes (Signed)
Patient reports new onset of dry eyes and seeing flashes of lights in her eyes. She is using OTC eye drops to help. She also is concerned about worsening numbness and tingling in her hands/feet.

## 2019-11-01 DIAGNOSIS — H43813 Vitreous degeneration, bilateral: Secondary | ICD-10-CM | POA: Diagnosis not present

## 2019-11-05 ENCOUNTER — Other Ambulatory Visit: Payer: Self-pay

## 2019-11-05 ENCOUNTER — Ambulatory Visit: Payer: 59

## 2019-11-05 DIAGNOSIS — C50411 Malignant neoplasm of upper-outer quadrant of right female breast: Secondary | ICD-10-CM

## 2019-11-06 ENCOUNTER — Inpatient Hospital Stay: Payer: 59

## 2019-11-06 ENCOUNTER — Other Ambulatory Visit: Payer: Self-pay

## 2019-11-06 ENCOUNTER — Inpatient Hospital Stay (HOSPITAL_BASED_OUTPATIENT_CLINIC_OR_DEPARTMENT_OTHER): Payer: 59 | Admitting: Internal Medicine

## 2019-11-06 ENCOUNTER — Encounter: Payer: Self-pay | Admitting: Internal Medicine

## 2019-11-06 DIAGNOSIS — C50411 Malignant neoplasm of upper-outer quadrant of right female breast: Secondary | ICD-10-CM

## 2019-11-06 DIAGNOSIS — Z170 Estrogen receptor positive status [ER+]: Secondary | ICD-10-CM | POA: Diagnosis not present

## 2019-11-06 DIAGNOSIS — Z5111 Encounter for antineoplastic chemotherapy: Secondary | ICD-10-CM | POA: Diagnosis not present

## 2019-11-06 DIAGNOSIS — Z95828 Presence of other vascular implants and grafts: Secondary | ICD-10-CM

## 2019-11-06 LAB — COMPREHENSIVE METABOLIC PANEL
ALT: 36 U/L (ref 0–44)
AST: 27 U/L (ref 15–41)
Albumin: 3.8 g/dL (ref 3.5–5.0)
Alkaline Phosphatase: 69 U/L (ref 38–126)
Anion gap: 7 (ref 5–15)
BUN: 21 mg/dL — ABNORMAL HIGH (ref 6–20)
CO2: 25 mmol/L (ref 22–32)
Calcium: 9.2 mg/dL (ref 8.9–10.3)
Chloride: 108 mmol/L (ref 98–111)
Creatinine, Ser: 0.73 mg/dL (ref 0.44–1.00)
GFR calc Af Amer: >60 mL/min (ref >60)
GFR calc non Af Amer: >60 mL/min (ref >60)
Glucose, Bld: 106 mg/dL — ABNORMAL HIGH (ref 70–99)
Potassium: 4 mmol/L (ref 3.5–5.1)
Sodium: 140 mmol/L (ref 135–145)
Total Bilirubin: 0.6 mg/dL (ref 0.3–1.2)
Total Protein: 6.6 g/dL (ref 6.5–8.1)

## 2019-11-06 LAB — CBC WITH DIFFERENTIAL/PLATELET
Abs Immature Granulocytes: 0.26 10*3/uL — ABNORMAL HIGH (ref 0.00–0.07)
Basophils Absolute: 0.1 10*3/uL (ref 0.0–0.1)
Basophils Relative: 3 %
Eosinophils Absolute: 0.2 10*3/uL (ref 0.0–0.5)
Eosinophils Relative: 4 %
HCT: 29.9 % — ABNORMAL LOW (ref 36.0–46.0)
Hemoglobin: 9.8 g/dL — ABNORMAL LOW (ref 12.0–15.0)
Immature Granulocytes: 6 %
Lymphocytes Relative: 22 %
Lymphs Abs: 1 10*3/uL (ref 0.7–4.0)
MCH: 29.8 pg (ref 26.0–34.0)
MCHC: 32.8 g/dL (ref 30.0–36.0)
MCV: 90.9 fL (ref 80.0–100.0)
Monocytes Absolute: 0.5 10*3/uL (ref 0.1–1.0)
Monocytes Relative: 11 %
Neutro Abs: 2.5 10*3/uL (ref 1.7–7.7)
Neutrophils Relative %: 54 %
Platelets: 274 10*3/uL (ref 150–400)
RBC: 3.29 MIL/uL — ABNORMAL LOW (ref 3.87–5.11)
RDW: 16.8 % — ABNORMAL HIGH (ref 11.5–15.5)
WBC: 4.5 10*3/uL (ref 4.0–10.5)
nRBC: 0 % (ref 0.0–0.2)

## 2019-11-06 MED ORDER — FAMOTIDINE IN NACL 20-0.9 MG/50ML-% IV SOLN
20.0000 mg | Freq: Once | INTRAVENOUS | Status: AC
  Administered 2019-11-06: 20 mg via INTRAVENOUS
  Filled 2019-11-06: qty 50

## 2019-11-06 MED ORDER — TRASTUZUMAB-ANNS CHEMO 150 MG IV SOLR
150.0000 mg | Freq: Once | INTRAVENOUS | Status: AC
  Administered 2019-11-06: 150 mg via INTRAVENOUS
  Filled 2019-11-06: qty 7.14

## 2019-11-06 MED ORDER — SODIUM CHLORIDE 0.9 % IV SOLN
20.0000 mg | Freq: Once | INTRAVENOUS | Status: AC
  Administered 2019-11-06: 20 mg via INTRAVENOUS
  Filled 2019-11-06: qty 20

## 2019-11-06 MED ORDER — HEPARIN SOD (PORK) LOCK FLUSH 100 UNIT/ML IV SOLN
500.0000 [IU] | Freq: Once | INTRAVENOUS | Status: AC | PRN
  Administered 2019-11-06: 500 [IU]
  Filled 2019-11-06: qty 5

## 2019-11-06 MED ORDER — HEPARIN SOD (PORK) LOCK FLUSH 100 UNIT/ML IV SOLN
INTRAVENOUS | Status: AC
  Filled 2019-11-06: qty 5

## 2019-11-06 MED ORDER — SODIUM CHLORIDE 0.9% FLUSH
10.0000 mL | INTRAVENOUS | Status: DC | PRN
  Administered 2019-11-06: 10 mL via INTRAVENOUS
  Filled 2019-11-06: qty 10

## 2019-11-06 MED ORDER — DIPHENHYDRAMINE HCL 50 MG/ML IJ SOLN
25.0000 mg | Freq: Once | INTRAMUSCULAR | Status: AC
  Administered 2019-11-06: 25 mg via INTRAVENOUS
  Filled 2019-11-06: qty 1

## 2019-11-06 MED ORDER — ACETAMINOPHEN 325 MG PO TABS
650.0000 mg | ORAL_TABLET | Freq: Once | ORAL | Status: AC
  Administered 2019-11-06: 650 mg via ORAL
  Filled 2019-11-06: qty 2

## 2019-11-06 MED ORDER — SODIUM CHLORIDE 0.9 % IV SOLN
80.0000 mg/m2 | Freq: Once | INTRAVENOUS | Status: AC
  Administered 2019-11-06: 156 mg via INTRAVENOUS
  Filled 2019-11-06: qty 26

## 2019-11-06 MED ORDER — SODIUM CHLORIDE 0.9 % IV SOLN
Freq: Once | INTRAVENOUS | Status: AC
  Filled 2019-11-06: qty 250

## 2019-11-06 NOTE — Progress Notes (Signed)
[place] NOTE  Patient Care Team: Birdie Sons, MD as PCP - General (Family Medicine) [facility] Gyn (Gynecology) Theodore Demark, RN as Oncology Nurse Navigator Cammie Sickle, MD as Consulting Physician (Hematology and Oncology)  CHIEF COMPLAINTS/PURPOSE OF CONSULTATION: Breast cancer  #  Oncology History Overview Note  # MARCH 2021- RUOQ- 47m- [BREAST, RIGHT, LATERAL POSTERIOR  - INVASIVE MAMMARY CARCINOMA, NO SPECIAL TYPE, ASSOCIATED WITH  CALCIFICATIONS.][place] ER-> 90%; PR- 50-90%; HER 2 FISH- POSITIVE  # S/P LUMPECTOMY [Dr.Cintron] pT1a [429m;pN0 [STAGE IA- ER/PRpoS; Her 2 POS]  . BREAST, RIGHT, LATERAL MIDDLE DEPTH (RIBBON-SHAPED CLIP):  STEREOTACTIC-GUIDED CORE BIOPSY: - DUCTAL CARCINOMA IN SITU (DCIS), HIGH-GRADE, ASSOCIATED WITH  CALCIFICATIONS.   # May 14th,2021- TH-H  #Hypothyroidism-methimazole [Dr.Solum]  # SURVIVORSHIP:   # GENETICS:   DIAGNOSIS: Breast cancer  STAGE:   1      ;  GOALS: Cure  CURRENT/MOST RECENT THERAPY : Taxol Herceptin   Carcinoma of upper-outer quadrant of right breast in female, estrogen receptor positive ([place] 07/[place] Initial Diagnosis   Carcinoma of upper-outer quadrant of right breast in female, estrogen receptor positive ([facility]  08/28/2019 -  Chemotherapy   The patient had PACLitaxel (TAXOL) 156 mg in sodium chloride 0.9 % 250 mL chemo infusion (</= 8076m2), 80 mg/m2 = 156 mg, Intravenous,  Once, 3 of 3 cycles Administration: 156 mg (08/28/2019), 156 mg (09/04/2019), 156 mg (09/25/2019), 156 mg (09/11/2019), 156 mg (09/18/2019), 156 mg (10/02/2019), 156 mg (10/09/2019), 156 mg (10/16/2019), 156 mg (10/23/2019), 156 mg (10/30/2019), 156 mg (11/06/2019) trastuzumab-anns (KANJINTI) 336 mg in sodium chloride 0.9 % 250 mL chemo infusion, 4 mg/kg = 336 mg (100 % of original dose 4 mg/kg), Intravenous,  Once, 3 of 16 cycles Dose modification: 4 mg/kg (original dose 4 mg/kg, Cycle 1, Reason: Other (see comments),  Comment: loading dose, biosimilar per insurance) Administration: 336 mg (08/28/2019), 150 mg (09/04/2019), 150 mg (09/25/2019), 150 mg (09/11/2019), 150 mg (09/18/2019), 150 mg (10/02/2019), 150 mg (10/09/2019), 150 mg (10/16/2019), 150 mg (10/23/2019), 150 mg (10/30/2019), 150 mg (11/06/2019)  for chemotherapy treatment.      HISTORY OF PRESENTING ILLNESS:  Connie Bradford 58 10o.  female with breast cancer early stage-ER/PR positive HER-2/neu positive on adjuvant chemotherapy-Taxol Herceptin.  No skin rash.  No diarrhea.  No nausea vomiting.  No headaches.  Complains of mild tingling and numbness in the extremities especially feet.  No falls.  This did not significantly interrupt her daily lifestyle.  Review of Systems  Constitutional: Positive for malaise/fatigue. Negative for chills, diaphoresis, fever and weight loss.  HENT: Negative for nosebleeds and sore throat.   Eyes: Negative for double vision.  Respiratory: Negative for cough, hemoptysis, sputum production, shortness of breath and wheezing.   Cardiovascular: Negative for chest pain, palpitations, orthopnea and leg swelling.  Gastrointestinal: Negative for abdominal pain, blood in stool, constipation, diarrhea, heartburn, melena, nausea and vomiting.  Genitourinary: Negative for dysuria, frequency and urgency.  Musculoskeletal: Negative for back pain and joint pain.  Skin: Negative for itching.  Neurological: Positive for tingling. Negative for dizziness, focal weakness, weakness and headaches.  Endo/Heme/Allergies: Does not bruise/bleed easily.  Psychiatric/Behavioral: Negative for depression. The patient is not nervous/anxious and does not have insomnia.      MEDICAL HISTORY:  Past Medical History:  Diagnosis Date  . Atypical mole 09/26/2018   right mid anterior side/mild  . Basal cell carcinoma 08/25/2014   right med cheek infraorbital  . Breast cancer ([place]  07/2019   [place] and DCIS  . Dyslipidemia   . Family history of bladder  cancer   . Graves disease   . Hyperthyroidism   . Inborn lipid storage disorder   . Leiomyoma of uterus   . Vitamin D deficiency     SURGICAL HISTORY: Past Surgical History:  Procedure Laterality Date  . APPENDECTOMY  1982   Cyst removed from ovaries   . BREAST BIOPSY Right 07/05/2019   stereo bx, coil clip, I[facility]  . BREAST BIOPSY Right 07/05/2019   stereo bx, ribbon clip, DCIS   . BREAST LUMPECTOMY Right 07/31/2019    [facility] and DCIS with SN bx  . COLONOSCOPY WITH PROPOFOL N/A 01/20/2017   Procedure: COLONOSCOPY WITH PROPOFOL;  Surgeon: Jonathon Bellows, MD;  Location: [facility] ENDOSCOPY;  Service: Gastroenterology;  Laterality: N/A;  . [place]  . NECK SURGERY  2013   LIft from weight loss  . PARTIAL MASTECTOMY WITH NEEDLE LOCALIZATION AND AXILLARY SENTINEL LYMPH NODE BX Right 07/31/2019   Procedure: RIGHT PARTIAL MASTECTOMY WITH BRACKETED NEEDLE LOCALIZATION AND LEFT AXILLARY SENTINEL LYMPH NODE BIOPSY;  Surgeon: Herbert Pun, MD;  Location: [facility] ORS;  Service: General;  Laterality: Right;  . PORTACATH PLACEMENT Left 07/31/2019   Procedure: INSERTION PORT-A-CATH LEFT INTERNAL JUGULAR;  Surgeon: Herbert Pun, MD;  Location: [facility] ORS;  Service: General;  Laterality: Left;  . TONSILLECTOMY  1969    SOCIAL HISTORY: Social History   Socioeconomic History  . Marital status: Married    Spouse name: Not on file  . Number of children: 0  . Years of education: Anselm Lis  . Highest education level: Not on file  Occupational History  . Occupation: Full-Time    Comment: [employer] [place] x9  Tobacco Use  . Smoking status: Former Research scientist (life sciences)  . Smokeless tobacco: Never Used  Vaping Use  . Vaping Use: Never used  Substance and Sexual Activity  . Alcohol use: Yes    Alcohol/week: 0.0 standard drinks    Comment: RARELY  . Drug use: No  . Sexual activity: Yes    Birth control/protection: Post-menopausal  Other Topics Concern  . Not on file   Social History Narrative   Lives close to [place]; with husband. Quit smoking in 2007 [25 years]; ocassional alcohol. 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    Social Determinants of Health   Financial Resource Strain:   . Difficulty of Paying Living Expenses:   Food Insecurity:   . Worried About Charity fundraiser in the Last Year:   . Arboriculturist in the Last Year:   Transportation Needs:   . Film/video editor (Medical):   Marland Kitchen Lack of Transportation (Non-Medical):   Physical Activity:   . Days of Exercise per Week:   . Minutes of Exercise per Session:   Stress:   . Feeling of Stress :   Social Connections:   . Frequency of Communication with Friends and Family:   . Frequency of Social Gatherings with Friends and Family:   . Attends Religious Services:   . Active Member of Clubs or Organizations:   . Attends Archivist Meetings:   Marland Kitchen Marital Status:   Intimate Partner Violence:   . Fear of Current or Ex-Partner:   . Emotionally Abused:   Marland Kitchen Physically Abused:   . Sexually Abused:     FAMILY HISTORY: Family History  Problem Relation Age of Onset  . Bladder Cancer Mother   . Diabetes Father   .  Heart disease Father   . Bladder Cancer Brother   . Breast cancer Neg Hx     ALLERGIES:  is allergic to penicillins.  MEDICATIONS:  Current Outpatient Medications  Medication Sig Dispense Refill  . B COMPLEX VITAMINS PO Take 1 tablet by mouth daily.     . Cholecalciferol (VITAMIN D) 125 MCG (5000 UT) CAPS Take 5,000 Units by mouth daily.     . COLLAGEN PO Take 1 tablet by mouth daily. Super Collagen w/Biotin    . ibuprofen (ADVIL) 200 MG tablet Take 600 mg by mouth every 8 (eight) hours as needed (for pain.).     Marland Kitchen lidocaine-prilocaine (EMLA) cream Apply 1 application topically as needed. 30 g 0  . methimazole (TAPAZOLE) 5 MG tablet Take 5 mg by mouth daily.     . Multiple Vitamin (MULTIVITAMIN WITH MINERALS) TABS tablet Take 1 tablet by mouth daily.    . Omega 3-6-9 Fatty  Acids (OMEGA 3-6-9 COMPLEX) CAPS Take 1 capsule by mouth daily.    . ondansetron (ZOFRAN) 8 MG tablet One pill every 8 hours as needed for nausea/vomitting. 40 tablet 1  . prochlorperazine (COMPAZINE) 10 MG tablet Take 1 tablet (10 mg total) by mouth every 6 (six) hours as needed for nausea or vomiting. 40 tablet 1  . valACYclovir (VALTREX) 500 MG tablet Take 500 mg by mouth 2 (two) times a week.     . clindamycin (CLEOCIN-T) 1 % lotion Apply topically daily. (Patient not taking: Reported on 10/31/2019) 60 mL 11   No current facility-administered medications for this visit.    PHYSICAL EXAMINATION: ECOG PERFORMANCE STATUS: 0 - Asymptomatic  Vitals:   11/06/19 0856  BP: 124/69  Pulse: 85  Temp: 98.7 F (37.1 C)  SpO2: 98%   Filed Weights   11/06/19 0856  Weight: 179 lb (81.2 kg)    Physical Exam HENT:     Head: Normocephalic and atraumatic.     Mouth/Throat:     Pharynx: No oropharyngeal exudate.  Eyes:     Pupils: Pupils are equal, round, and reactive to light.  Cardiovascular:     Rate and Rhythm: Normal rate and regular rhythm.  Pulmonary:     Effort: Pulmonary effort is normal. No respiratory distress.     Breath sounds: Normal breath sounds. No wheezing.  Abdominal:     General: Bowel sounds are normal. There is no distension.     Palpations: Abdomen is soft. There is no mass.     Tenderness: There is no abdominal tenderness. There is no guarding or rebound.  Musculoskeletal:        General: No tenderness. Normal range of motion.     Cervical back: Normal range of motion and neck supple.  Skin:    General: Skin is warm.     Comments: Rash on bilateral hands.   Neurological:     Mental Status: She is alert and oriented to person, place, and time.  Psychiatric:        Mood and Affect: Affect normal.    LABORATORY DATA:  I have reviewed the data as listed Lab Results  Component Value Date   WBC 4.5 11/06/2019   HGB 9.8 (L) 11/06/2019   HCT 29.9 (L)  11/06/2019   MCV 90.9 11/06/2019   PLT 274 11/06/2019   Recent Labs    10/23/19 0836 10/30/19 0832 11/06/19 0839  NA 141 139 140  K 4.0 3.9 4.0  CL 108 105 108  CO2 23 25 25  GLUCOSE 122* 114* 106*  BUN 18 18 21*  CREATININE 0.89 0.82 0.73  CALCIUM 9.1 9.3 9.2  GFRNONAA >60 >60 >60  GFRAA >60 >60 >60  PROT 6.8 6.9 6.6  ALBUMIN 4.1 4.0 3.8  AST 33 29 27  ALT 42 36 36  ALKPHOS 71 72 69  BILITOT 0.6 0.7 0.6    RADIOGRAPHIC STUDIES: I have personally reviewed the radiological images as listed and agreed with the findings in the report. No results found. ASSESSMENT & PLAN:   Carcinoma of upper-outer quadrant of right breast in female, estrogen receptor positive ([place]) #Right breast-invasive mammary carcinoma- S/P LUMPECTOMY pT1a [65m];pN0 [STAGE IA- ER/PRpoS; Her 2 POS]; STAGE I. On adjuvant Taxol-Herceptin. STABLE  # Proceed with cycle #3-day-15 today. Labs today reviewed;  acceptable for treatment today.    # Slightly elevated AST ALT x 2 times times normal limits-currently normal limits. STABLE.   # Fatigue-grade 1; secondary chemotherapy-STABLE.   # SWOG study/PN study- PN- G-1.  STABLE; monitor for now.  # Disposition: vacation- aug 23rd x10 days.  # Chemo today #  1 week- labs-cbc/bmp/taxol-hercerptin #  follow up in 2 weeks- MD; labs- cbc/cmp; Herceptin- Dr.B  Cc: Dr.Cintron  All questions were answered. The patient/family knows to call the clinic with any problems, questions or concerns.    GCammie Sickle MD 11/06/2019 7:41 PM

## 2019-11-06 NOTE — Assessment & Plan Note (Addendum)
#  Right breast-invasive mammary carcinoma- S/P LUMPECTOMY pT1a [24mm];pN0 [STAGE IA- ER/PRpoS; Her 2 POS]; STAGE I. On adjuvant Taxol-Herceptin. STABLE  # Proceed with cycle #3-day-15 today. Labs today reviewed;  acceptable for treatment today.    # Slightly elevated AST ALT x 2 times times normal limits-currently normal limits. STABLE.   # Fatigue-grade 1; secondary chemotherapy-STABLE.   # SWOG study/PN study- PN- G-1.  STABLE; monitor for now.  # Disposition: vacation- aug 23rd x10 days.  # Chemo today #  1 week- labs-cbc/bmp/taxol-hercerptin #  follow up in 2 weeks- MD; labs- cbc/cmp; Herceptin- Dr.B  Cc: Dr.Cintron

## 2019-11-14 ENCOUNTER — Other Ambulatory Visit: Payer: Self-pay

## 2019-11-14 ENCOUNTER — Inpatient Hospital Stay: Payer: 59

## 2019-11-14 VITALS — BP 121/64 | HR 78 | Temp 98.6°F | Resp 20 | Wt 177.0 lb

## 2019-11-14 DIAGNOSIS — Z170 Estrogen receptor positive status [ER+]: Secondary | ICD-10-CM

## 2019-11-14 DIAGNOSIS — C50411 Malignant neoplasm of upper-outer quadrant of right female breast: Secondary | ICD-10-CM

## 2019-11-14 DIAGNOSIS — Z5111 Encounter for antineoplastic chemotherapy: Secondary | ICD-10-CM | POA: Diagnosis not present

## 2019-11-14 LAB — CBC WITH DIFFERENTIAL/PLATELET
Abs Immature Granulocytes: 0.33 10*3/uL — ABNORMAL HIGH (ref 0.00–0.07)
Basophils Absolute: 0.1 10*3/uL (ref 0.0–0.1)
Basophils Relative: 2 %
Eosinophils Absolute: 0.1 10*3/uL (ref 0.0–0.5)
Eosinophils Relative: 2 %
HCT: 31.7 % — ABNORMAL LOW (ref 36.0–46.0)
Hemoglobin: 10.1 g/dL — ABNORMAL LOW (ref 12.0–15.0)
Immature Granulocytes: 6 %
Lymphocytes Relative: 18 %
Lymphs Abs: 1 10*3/uL (ref 0.7–4.0)
MCH: 29.7 pg (ref 26.0–34.0)
MCHC: 31.9 g/dL (ref 30.0–36.0)
MCV: 93.2 fL (ref 80.0–100.0)
Monocytes Absolute: 0.6 10*3/uL (ref 0.1–1.0)
Monocytes Relative: 11 %
Neutro Abs: 3.6 10*3/uL (ref 1.7–7.7)
Neutrophils Relative %: 61 %
Platelets: 283 10*3/uL (ref 150–400)
RBC: 3.4 MIL/uL — ABNORMAL LOW (ref 3.87–5.11)
RDW: 17.2 % — ABNORMAL HIGH (ref 11.5–15.5)
Smear Review: NORMAL
WBC: 5.8 10*3/uL (ref 4.0–10.5)
nRBC: 0 % (ref 0.0–0.2)

## 2019-11-14 LAB — BASIC METABOLIC PANEL
Anion gap: 8 (ref 5–15)
BUN: 20 mg/dL (ref 6–20)
CO2: 24 mmol/L (ref 22–32)
Calcium: 9.5 mg/dL (ref 8.9–10.3)
Chloride: 107 mmol/L (ref 98–111)
Creatinine, Ser: 0.81 mg/dL (ref 0.44–1.00)
GFR calc Af Amer: >60 mL/min (ref >60)
GFR calc non Af Amer: >60 mL/min (ref >60)
Glucose, Bld: 124 mg/dL — ABNORMAL HIGH (ref 70–99)
Potassium: 4 mmol/L (ref 3.5–5.1)
Sodium: 139 mmol/L (ref 135–145)

## 2019-11-14 MED ORDER — TRASTUZUMAB-ANNS CHEMO 150 MG IV SOLR
150.0000 mg | Freq: Once | INTRAVENOUS | Status: AC
  Administered 2019-11-14: 150 mg via INTRAVENOUS
  Filled 2019-11-14: qty 7.14

## 2019-11-14 MED ORDER — HEPARIN SOD (PORK) LOCK FLUSH 100 UNIT/ML IV SOLN
500.0000 [IU] | Freq: Once | INTRAVENOUS | Status: AC | PRN
  Administered 2019-11-14: 500 [IU]
  Filled 2019-11-14: qty 5

## 2019-11-14 MED ORDER — SODIUM CHLORIDE 0.9 % IV SOLN
Freq: Once | INTRAVENOUS | Status: AC
  Filled 2019-11-14: qty 250

## 2019-11-14 MED ORDER — ACETAMINOPHEN 325 MG PO TABS
650.0000 mg | ORAL_TABLET | Freq: Once | ORAL | Status: AC
  Administered 2019-11-14: 650 mg via ORAL
  Filled 2019-11-14: qty 2

## 2019-11-14 MED ORDER — SODIUM CHLORIDE 0.9 % IV SOLN
20.0000 mg | Freq: Once | INTRAVENOUS | Status: AC
  Administered 2019-11-14: 20 mg via INTRAVENOUS
  Filled 2019-11-14: qty 20

## 2019-11-14 MED ORDER — DIPHENHYDRAMINE HCL 50 MG/ML IJ SOLN
25.0000 mg | Freq: Once | INTRAMUSCULAR | Status: AC
  Administered 2019-11-14: 25 mg via INTRAVENOUS
  Filled 2019-11-14: qty 1

## 2019-11-14 MED ORDER — FAMOTIDINE IN NACL 20-0.9 MG/50ML-% IV SOLN
20.0000 mg | Freq: Once | INTRAVENOUS | Status: AC
  Administered 2019-11-14: 20 mg via INTRAVENOUS
  Filled 2019-11-14: qty 50

## 2019-11-14 MED ORDER — SODIUM CHLORIDE 0.9 % IV SOLN
80.0000 mg/m2 | Freq: Once | INTRAVENOUS | Status: AC
  Administered 2019-11-14: 156 mg via INTRAVENOUS
  Filled 2019-11-14: qty 26

## 2019-11-20 ENCOUNTER — Other Ambulatory Visit: Payer: Self-pay

## 2019-11-20 ENCOUNTER — Inpatient Hospital Stay: Payer: 59 | Attending: Internal Medicine

## 2019-11-20 ENCOUNTER — Encounter: Payer: Self-pay | Admitting: *Deleted

## 2019-11-20 ENCOUNTER — Encounter: Payer: Self-pay | Admitting: Internal Medicine

## 2019-11-20 ENCOUNTER — Inpatient Hospital Stay (HOSPITAL_BASED_OUTPATIENT_CLINIC_OR_DEPARTMENT_OTHER): Payer: 59 | Admitting: Internal Medicine

## 2019-11-20 ENCOUNTER — Inpatient Hospital Stay: Payer: 59

## 2019-11-20 VITALS — BP 115/78 | HR 72 | Resp 16

## 2019-11-20 DIAGNOSIS — C50411 Malignant neoplasm of upper-outer quadrant of right female breast: Secondary | ICD-10-CM | POA: Insufficient documentation

## 2019-11-20 DIAGNOSIS — Z8052 Family history of malignant neoplasm of bladder: Secondary | ICD-10-CM | POA: Insufficient documentation

## 2019-11-20 DIAGNOSIS — Z170 Estrogen receptor positive status [ER+]: Secondary | ICD-10-CM

## 2019-11-20 DIAGNOSIS — Z006 Encounter for examination for normal comparison and control in clinical research program: Secondary | ICD-10-CM

## 2019-11-20 DIAGNOSIS — Z9221 Personal history of antineoplastic chemotherapy: Secondary | ICD-10-CM | POA: Insufficient documentation

## 2019-11-20 DIAGNOSIS — G629 Polyneuropathy, unspecified: Secondary | ICD-10-CM | POA: Diagnosis not present

## 2019-11-20 DIAGNOSIS — R21 Rash and other nonspecific skin eruption: Secondary | ICD-10-CM | POA: Diagnosis not present

## 2019-11-20 DIAGNOSIS — Z87891 Personal history of nicotine dependence: Secondary | ICD-10-CM | POA: Diagnosis not present

## 2019-11-20 DIAGNOSIS — Z5112 Encounter for antineoplastic immunotherapy: Secondary | ICD-10-CM | POA: Diagnosis not present

## 2019-11-20 DIAGNOSIS — R530 Neoplastic (malignant) related fatigue: Secondary | ICD-10-CM | POA: Diagnosis not present

## 2019-11-20 DIAGNOSIS — Z95828 Presence of other vascular implants and grafts: Secondary | ICD-10-CM

## 2019-11-20 LAB — CBC WITH DIFFERENTIAL/PLATELET
Abs Immature Granulocytes: 0.18 10*3/uL — ABNORMAL HIGH (ref 0.00–0.07)
Basophils Absolute: 0.1 10*3/uL (ref 0.0–0.1)
Basophils Relative: 3 %
Eosinophils Absolute: 0.2 10*3/uL (ref 0.0–0.5)
Eosinophils Relative: 4 %
HCT: 33 % — ABNORMAL LOW (ref 36.0–46.0)
Hemoglobin: 10.6 g/dL — ABNORMAL LOW (ref 12.0–15.0)
Immature Granulocytes: 4 %
Lymphocytes Relative: 22 %
Lymphs Abs: 1 10*3/uL (ref 0.7–4.0)
MCH: 29.6 pg (ref 26.0–34.0)
MCHC: 32.1 g/dL (ref 30.0–36.0)
MCV: 92.2 fL (ref 80.0–100.0)
Monocytes Absolute: 0.5 10*3/uL (ref 0.1–1.0)
Monocytes Relative: 10 %
Neutro Abs: 2.6 10*3/uL (ref 1.7–7.7)
Neutrophils Relative %: 57 %
Platelets: 291 10*3/uL (ref 150–400)
RBC: 3.58 MIL/uL — ABNORMAL LOW (ref 3.87–5.11)
RDW: 16.4 % — ABNORMAL HIGH (ref 11.5–15.5)
WBC: 4.5 10*3/uL (ref 4.0–10.5)
nRBC: 0 % (ref 0.0–0.2)

## 2019-11-20 LAB — COMPREHENSIVE METABOLIC PANEL
ALT: 36 U/L (ref 0–44)
AST: 32 U/L (ref 15–41)
Albumin: 4.1 g/dL (ref 3.5–5.0)
Alkaline Phosphatase: 72 U/L (ref 38–126)
Anion gap: 8 (ref 5–15)
BUN: 22 mg/dL — ABNORMAL HIGH (ref 6–20)
CO2: 24 mmol/L (ref 22–32)
Calcium: 9.6 mg/dL (ref 8.9–10.3)
Chloride: 107 mmol/L (ref 98–111)
Creatinine, Ser: 0.83 mg/dL (ref 0.44–1.00)
GFR calc Af Amer: >60 mL/min (ref >60)
GFR calc non Af Amer: >60 mL/min (ref >60)
Glucose, Bld: 98 mg/dL (ref 70–99)
Potassium: 4 mmol/L (ref 3.5–5.1)
Sodium: 139 mmol/L (ref 135–145)
Total Bilirubin: 0.8 mg/dL (ref 0.3–1.2)
Total Protein: 6.7 g/dL (ref 6.5–8.1)

## 2019-11-20 MED ORDER — HEPARIN SOD (PORK) LOCK FLUSH 100 UNIT/ML IV SOLN
500.0000 [IU] | Freq: Once | INTRAVENOUS | Status: AC
  Administered 2019-11-20: 500 [IU] via INTRAVENOUS
  Filled 2019-11-20: qty 5

## 2019-11-20 MED ORDER — DIPHENHYDRAMINE HCL 25 MG PO CAPS
25.0000 mg | ORAL_CAPSULE | Freq: Once | ORAL | Status: AC
  Administered 2019-11-20: 25 mg via ORAL
  Filled 2019-11-20: qty 1

## 2019-11-20 MED ORDER — SODIUM CHLORIDE 0.9 % IV SOLN
Freq: Once | INTRAVENOUS | Status: AC
  Filled 2019-11-20: qty 250

## 2019-11-20 MED ORDER — ACETAMINOPHEN 325 MG PO TABS
650.0000 mg | ORAL_TABLET | Freq: Once | ORAL | Status: AC
  Administered 2019-11-20: 650 mg via ORAL
  Filled 2019-11-20: qty 2

## 2019-11-20 MED ORDER — HEPARIN SOD (PORK) LOCK FLUSH 100 UNIT/ML IV SOLN
INTRAVENOUS | Status: AC
  Filled 2019-11-20: qty 5

## 2019-11-20 MED ORDER — HEPARIN SOD (PORK) LOCK FLUSH 100 UNIT/ML IV SOLN
500.0000 [IU] | Freq: Once | INTRAVENOUS | Status: DC | PRN
  Filled 2019-11-20: qty 5

## 2019-11-20 MED ORDER — TRASTUZUMAB-ANNS CHEMO 150 MG IV SOLR
150.0000 mg | Freq: Once | INTRAVENOUS | Status: AC
  Administered 2019-11-20: 150 mg via INTRAVENOUS
  Filled 2019-11-20: qty 7.14

## 2019-11-20 MED ORDER — SODIUM CHLORIDE 0.9% FLUSH
10.0000 mL | Freq: Once | INTRAVENOUS | Status: AC
  Administered 2019-11-20: 10 mL via INTRAVENOUS
  Filled 2019-11-20: qty 10

## 2019-11-20 NOTE — Research (Signed)
Patient Connie Bradford presents to clinic this morning for her scheduled infusion of Herceptin alone. She completed her Taxol infusions last week. Solicited Neuropathy events were assessed and patient reports experiencing numbness and tingling in her fingers & hands as well as her toes and feet that is almost constant and is painful at times. She denies experiencing any difficulty performing ADLs, but states she is now having problems with fine motor skills such as writing and buttoning her blouse. She is able to do these but with a lot of difficulty. She still denies experiencing any problems with her balance and ambulation. She began her chemotherapy 12 weeks ago now, and continues to report some mild fatigue that is ongoing. Dr. Rogue Bussing has previously recommended exercise to combat this. Questioned patient about any ancillary therapy she has used for her neuropathy and she denies having used acupuncture, cooling or compression gloves/socks, but reports having received a massage, but states this did not improve her neuropathy symptoms at all. Ms. Timberman completed the week 12 study questionnaires including: PROMIS 29, GSLPTAQ, EORTC QLQ-CIPN20, PRO-CTCAE, Follow up Patient Symptom burden questionnaire and FACT/GOG-NTX. Dr. Rogue Bussing performed H&P and completed the Follow up Physician Toxicity Burden assessment. Patient confirms she is right hand and right foot dominant. Neuropen and Tuning fork assessments were also completed by this RN with assistance of Jeral Fruit, RN for the timed assessments. Patient voices concern that she can barely feel the tuning fork vibrations now and requested a couple of the touch points be repeated, which was done for her. Solicited and other adverse events with grade and attribution as note below:   Solicited Adverse Event Log  Study/Protocol: SWOG A2505 Cycle: Week 12 Event Grade Onset Date Resolved Date Drug Name Attribution Treatment Comments  Dysesthesia 0         Neuralgia 1 09/26/2019  Taxol definitely None yet   Paresthesia 0        Peripheral Motor Neuropathy 0        Peripheral Sensory Neuropathy 2 1 11/13/2019 09/26/2019  11/12/2019 Taxol definitely None yet   Fatigue (not a solicited AE) 1 06/25/7671  Taxol+Herceptin definitely exercise   Alopecia (not solicited) 2 07/17/9377  Taxol definitely    [name], BSN, MHA, OCN 11/20/2019 10:09 AM

## 2019-11-20 NOTE — Assessment & Plan Note (Addendum)
#  Right breast-invasive mammary carcinoma- S/P LUMPECTOMY pT1a [48mm];pN0 [STAGE IA- ER/PRpoS; Her 2 POS]; STAGE I. S/p Taxol-Herceptin. Stable.   # Proceed with maintenance Herceptin labs today reviewed;  acceptable for treatment today.    # Fatigue-grade 1; secondary chemotherapy- STABLE.   # Skin rash/ photosensitivity G-1-2; ? Sec to taxol. Monitor closely.  # SWOG study/PN study- PN- G-2; monitor for now. Monitor closely.  # Disposition: [place]- aug 23rd- 31st # Herceptin today #  follow up in 4 weeks- MD; labs- cbc/cmp; Herceptin- Dr.B

## 2019-11-20 NOTE — Progress Notes (Signed)
one [place] NOTE  Patient Care Team: Birdie Sons, MD as PCP - General (Family Medicine) [facility] Gyn (Gynecology) Theodore Demark, RN as Oncology Nurse Navigator Cammie Sickle, MD as Consulting Physician (Hematology and Oncology)  CHIEF COMPLAINTS/PURPOSE OF CONSULTATION: Breast cancer  #  Oncology History Overview Note  # MARCH 2021- RUOQ- 4m- [BREAST, RIGHT, LATERAL POSTERIOR  - INVASIVE MAMMARY CARCINOMA, NO SPECIAL TYPE, ASSOCIATED WITH  CALCIFICATIONS.][place] ER-> 90%; PR- 50-90%; HER 2 FISH- POSITIVE  # S/P LUMPECTOMY [Dr.Cintron] pT1a [448m;pN0 [STAGE IA- ER/PRpoS; Her 2 POS]  . BREAST, RIGHT, LATERAL MIDDLE DEPTH (RIBBON-SHAPED CLIP):  STEREOTACTIC-GUIDED CORE BIOPSY: - DUCTAL CARCINOMA IN SITU (DCIS), HIGH-GRADE, ASSOCIATED WITH  CALCIFICATIONS.   # May 14th,2021- TH-H  #Hypothyroidism-methimazole [Dr.Solum]  # SURVIVORSHIP:   # GENETICS:   DIAGNOSIS: Breast cancer  STAGE:   1      ;  GOALS: Cure  CURRENT/MOST RECENT THERAPY : Taxol Herceptin   Carcinoma of upper-outer quadrant of right breast in female, estrogen receptor positive ([place] 07/15/2019 Initial Diagnosis   Carcinoma of upper-outer quadrant of right breast in female, estrogen receptor positive ([facility] [place]  08/28/2019 -  Chemotherapy   The patient had PACLitaxel (TAXOL) 156 mg in sodium chloride 0.9 % 250 mL chemo infusion (</= 8071m2), 80 mg/m2 = 156 mg, Intravenous,  Once, 3 of 3 cycles Administration: 156 mg (08/28/2019), 156 mg (09/04/2019), 156 mg (09/25/2019), 156 mg (09/11/2019), 156 mg (09/18/2019), 156 mg (10/02/2019), 156 mg (10/09/2019), 156 mg (10/16/2019), 156 mg (10/23/2019), 156 mg (10/30/2019), 156 mg (11/06/2019), 156 mg (11/14/2019) trastuzumab-anns (KANJINTI) 336 mg in sodium chloride 0.9 % 250 mL chemo infusion, 4 mg/kg = 336 mg (100 % of original dose 4 mg/kg), Intravenous,  Once, 4 of 16 cycles Dose modification: 4 mg/kg (original dose 4 mg/kg, Cycle 1, Reason: Other  (see comments), Comment: loading dose, biosimilar per insurance) Administration: 336 mg (08/28/2019), 150 mg (09/04/2019), 150 mg (09/25/2019), 150 mg (11/20/2019), 150 mg (09/11/2019), 150 mg (09/18/2019), 150 mg (10/02/2019), 150 mg (10/09/2019), 150 mg (10/16/2019), 150 mg (10/23/2019), 150 mg (10/30/2019), 150 mg (11/06/2019), 150 mg (11/14/2019)  for chemotherapy treatment.      HISTORY OF PRESENTING ILLNESS:  AngHonestSmall [age]o.  female with breast cancer early stage-ER/PR positive HER-2/neu positive on adjuvant chemotherapy-Taxol Herceptin.  Patient also have rash on her bilateral upper extremity extensor surfaces intermittently itching. No rash noted else.  Continues to have mild to moderate tingling and numbness of extremities-causes intermittent difficulty with her ADLs like buttoning; writing etc.  Notes she has some discomfort on the right lateral breast area. No trauma. No lumps.  Review of Systems  Constitutional: Positive for malaise/fatigue. Negative for chills, diaphoresis, fever and weight loss.  HENT: Negative for nosebleeds and sore throat.   Eyes: Negative for double vision.  Respiratory: Negative for cough, hemoptysis, sputum production, shortness of breath and wheezing.   Cardiovascular: Negative for chest pain, palpitations, orthopnea and leg swelling.  Gastrointestinal: Negative for abdominal pain, blood in stool, constipation, diarrhea, heartburn, melena, nausea and vomiting.  Genitourinary: Negative for dysuria, frequency and urgency.  Musculoskeletal: Negative for back pain and joint pain.  Skin: Negative for itching.  Neurological: Positive for tingling. Negative for dizziness, focal weakness, weakness and headaches.  Endo/Heme/Allergies: Does not bruise/bleed easily.  Psychiatric/Behavioral: Negative for depression. The patient is not nervous/anxious and does not have insomnia.      MEDICAL HISTORY:  Past Medical History:  Diagnosis Date  .  Atypical mole 09/26/2018    right mid anterior side/mild  . Basal cell carcinoma 08/25/2014   right med cheek infraorbital  . Breast cancer ([place]) 07/2019   [place] and DCIS  . Dyslipidemia   . Family history of bladder cancer   . Graves disease   . Hyperthyroidism   . Inborn lipid storage disorder   . Leiomyoma of uterus   . Vitamin D deficiency     SURGICAL HISTORY: Past Surgical History:  Procedure Laterality Date  . APPENDECTOMY  1982   Cyst removed from ovaries   . BREAST BIOPSY Right 07/05/2019   stereo bx, coil clip, IMC  . BREAST BIOPSY Right 07/05/2019   stereo bx, ribbon clip, DCIS   . BREAST LUMPECTOMY Right 07/31/2019    [facility] and DCIS with SN bx  . COLONOSCOPY WITH PROPOFOL N/A 01/20/2017   Procedure: COLONOSCOPY WITH PROPOFOL;  Surgeon: Jonathon Bellows, MD;  Location: [facility] ENDOSCOPY;  Service: Gastroenterology;  Laterality: N/A;  . [place]  . NECK SURGERY  2013   LIft from weight loss  . PARTIAL MASTECTOMY WITH NEEDLE LOCALIZATION AND AXILLARY SENTINEL LYMPH NODE BX Right 07/31/2019   Procedure: RIGHT PARTIAL MASTECTOMY WITH BRACKETED NEEDLE LOCALIZATION AND LEFT AXILLARY SENTINEL LYMPH NODE BIOPSY;  Surgeon: Herbert Pun, MD;  Location: [facility] ORS;  Service: General;  Laterality: Right;  . PORTACATH PLACEMENT Left 07/31/2019   Procedure: INSERTION PORT-A-CATH LEFT INTERNAL JUGULAR;  Surgeon: Herbert Pun, MD;  Location: [facility] ORS;  Service: General;  Laterality: Left;  . TONSILLECTOMY  1969    SOCIAL HISTORY: Social History   Socioeconomic History  . Marital status: Married    Spouse name: Not on file  . Number of children: 0  . Years of education: Anselm Lis  . Highest education level: Not on file  Occupational History  . Occupation: Full-Time    Comment: [employer] x9  Tobacco Use  . Smoking status: Former Research scientist (life sciences)  . Smokeless tobacco: Never Used  Vaping Use  . Vaping Use: Never used  Substance and Sexual Activity  . Alcohol use:  Yes    Alcohol/week: 0.0 standard drinks    Comment: RARELY  . Drug use: No  . Sexual activity: Yes    Birth control/protection: Post-menopausal  Other Topics Concern  . Not on file  Social History Narrative   Lives close to [place]; with husband. Quit smoking in 2007 [25 years]; ocassional alcohol. 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    Social Determinants of Health   Financial Resource Strain:   . Difficulty of Paying Living Expenses:   Food Insecurity:   . Worried About Charity fundraiser in the Last Year:   . Arboriculturist in the Last Year:   Transportation Needs:   . Film/video editor (Medical):   Marland Kitchen Lack of Transportation (Non-Medical):   Physical Activity:   . Days of Exercise per Week:   . Minutes of Exercise per Session:   Stress:   . Feeling of Stress :   Social Connections:   . Frequency of Communication with Friends and Family:   . Frequency of Social Gatherings with Friends and Family:   . Attends Religious Services:   . Active Member of Clubs or Organizations:   . Attends Archivist Meetings:   Marland Kitchen Marital Status:   Intimate Partner Violence:   . Fear of Current or Ex-Partner:   . Emotionally Abused:   Marland Kitchen Physically Abused:   . Sexually  Abused:     FAMILY HISTORY: Family History  Problem Relation Age of Onset  . Bladder Cancer Mother   . Diabetes Father   . Heart disease Father   . Bladder Cancer Brother   . Breast cancer Neg Hx     ALLERGIES:  is allergic to penicillins.  MEDICATIONS:  Current Outpatient Medications  Medication Sig Dispense Refill  . B COMPLEX VITAMINS PO Take 1 tablet by mouth daily.     . Cholecalciferol (VITAMIN D) 125 MCG (5000 UT) CAPS Take 5,000 Units by mouth daily.     . COLLAGEN PO Take 1 tablet by mouth daily. Super Collagen w/Biotin    . ibuprofen (ADVIL) 200 MG tablet Take 600 mg by mouth every 8 (eight) hours as needed (for pain.).     Marland Kitchen lidocaine-prilocaine (EMLA) cream Apply 1 application topically as needed. 30  g 0  . methimazole (TAPAZOLE) 5 MG tablet Take 5 mg by mouth daily.     . Multiple Vitamin (MULTIVITAMIN WITH MINERALS) TABS tablet Take 1 tablet by mouth daily.    . Omega 3-6-9 Fatty Acids (OMEGA 3-6-9 COMPLEX) CAPS Take 1 capsule by mouth daily.    . ondansetron (ZOFRAN) 8 MG tablet One pill every 8 hours as needed for nausea/vomitting. 40 tablet 1  . prochlorperazine (COMPAZINE) 10 MG tablet Take 1 tablet (10 mg total) by mouth every 6 (six) hours as needed for nausea or vomiting. 40 tablet 1  . valACYclovir (VALTREX) 500 MG tablet Take 500 mg by mouth 2 (two) times a week.     . clindamycin (CLEOCIN-T) 1 % lotion Apply topically daily. (Patient not taking: Reported on 11/19/2019) 60 mL 11   No current facility-administered medications for this visit.   Facility-Administered Medications Ordered in Other Visits  Medication Dose Route Frequency Provider Last Rate Last Admin  . heparin lock flush 100 unit/mL  500 Units Intravenous Once [name] R, MD      . heparin lock flush 100 unit/mL  500 Units Intracatheter Once PRN Cammie Sickle, MD        PHYSICAL EXAMINATION: ECOG PERFORMANCE STATUS: 0 - Asymptomatic  Vitals:   11/19/19 1605 11/20/19 1000  BP:  122/69  Pulse: 96 96  Resp: 16 16  Temp: 98.6 F (37 C) 98.6 F (37 C)  SpO2: 100% 100%   Filed Weights   11/20/19 1000  Weight: 175 lb (79.4 kg)    Physical Exam HENT:     Head: Normocephalic and atraumatic.     Mouth/Throat:     Pharynx: No oropharyngeal exudate.  Eyes:     Pupils: Pupils are equal, round, and reactive to light.  Cardiovascular:     Rate and Rhythm: Normal rate and regular rhythm.  Pulmonary:     Effort: Pulmonary effort is normal. No respiratory distress.     Breath sounds: Normal breath sounds. No wheezing.  Abdominal:     General: Bowel sounds are normal. There is no distension.     Palpations: Abdomen is soft. There is no mass.     Tenderness: There is no abdominal tenderness.  There is no guarding or rebound.  Musculoskeletal:        General: No tenderness. Normal range of motion.     Cervical back: Normal range of motion and neck supple.  Skin:    General: Skin is warm.     Comments: Rash on bilateral hands.   Right breast-9:00-no masses felt. No rash noted. Tenderness only to  deep palpation.  Neurological:     Mental Status: She is alert and oriented to person, place, and time.  Psychiatric:        Mood and Affect: Affect normal.    LABORATORY DATA:  I have reviewed the data as listed Lab Results  Component Value Date   WBC 4.5 11/20/2019   HGB 10.6 (L) 11/20/2019   HCT 33.0 (L) 11/20/2019   MCV 92.2 11/20/2019   PLT 291 11/20/2019   Recent Labs    10/30/19 0832 10/30/19 0832 11/06/19 0839 11/14/19 0826 11/20/19 0903  NA 139   < > 140 139 139  K 3.9   < > 4.0 4.0 4.0  CL 105   < > 108 107 107  CO2 25   < > '25 24 24  ' GLUCOSE 114*   < > 106* 124* 98  BUN 18   < > 21* 20 22*  CREATININE 0.82   < > 0.73 0.81 0.83  CALCIUM 9.3   < > 9.2 9.5 9.6  GFRNONAA >60   < > >60 >60 >60  GFRAA >60   < > >60 >60 >60  PROT 6.9  --  6.6  --  6.7  ALBUMIN 4.0  --  3.8  --  4.1  AST 29  --  27  --  32  ALT 36  --  36  --  36  ALKPHOS 72  --  69  --  72  BILITOT 0.7  --  0.6  --  0.8   < > = values in this interval not displayed.    RADIOGRAPHIC STUDIES: I have personally reviewed the radiological images as listed and agreed with the findings in the report. No results found. ASSESSMENT & PLAN:   Carcinoma of upper-outer quadrant of right breast in female, estrogen receptor positive ([place]) #Right breast-invasive mammary carcinoma- S/P LUMPECTOMY pT1a [21m];pN0 [STAGE IA- ER/PRpoS; Her 2 POS]; STAGE I. S/p Taxol-Herceptin. Stable.   # Proceed with maintenance Herceptin labs today reviewed;  acceptable for treatment today.    # Fatigue-grade 1; secondary chemotherapy- STABLE.   # Skin rash/ photosensitivity G-1-2; ? Sec to taxol. Monitor  closely.  # SWOG study/PN study- PN- G-2; monitor for now. Monitor closely.  # Disposition: [place]- aug 23rd- 31st # Herceptin today #  follow up in 4 weeks- MD; labs- cbc/cmp; Herceptin- Dr.B   All questions were answered. The patient/family knows to call the clinic with any problems, questions or concerns.    GCammie Sickle MD 11/20/2019 11:10 AM

## 2019-11-29 ENCOUNTER — Encounter: Payer: Self-pay | Admitting: Internal Medicine

## 2019-12-02 ENCOUNTER — Ambulatory Visit
Admission: RE | Admit: 2019-12-02 | Discharge: 2019-12-02 | Disposition: A | Discharge status: Home / Self Care | Payer: 59 | Source: Ambulatory Visit | Attending: Radiation Oncology | Admitting: Radiation Oncology

## 2019-12-02 DIAGNOSIS — Z170 Estrogen receptor positive status [ER+]: Secondary | ICD-10-CM | POA: Diagnosis not present

## 2019-12-02 DIAGNOSIS — C50411 Malignant neoplasm of upper-outer quadrant of right female breast: Secondary | ICD-10-CM | POA: Insufficient documentation

## 2019-12-04 ENCOUNTER — Telehealth: Payer: Self-pay | Admitting: Internal Medicine

## 2019-12-04 ENCOUNTER — Telehealth: Payer: Self-pay | Admitting: *Deleted

## 2019-12-04 DIAGNOSIS — H43813 Vitreous degeneration, bilateral: Secondary | ICD-10-CM | POA: Diagnosis not present

## 2019-12-04 NOTE — Telephone Encounter (Signed)
LVM to call us back re; concerns of nail changes. GB

## 2019-12-04 NOTE — Telephone Encounter (Signed)
I so appreciate you Sharyn Lull! Thanks  Faythe Casa, NP 12/04/2019 4:48 PM

## 2019-12-04 NOTE — Telephone Encounter (Signed)
Patient called reporting that it looks like she has blood under her toenails which has gotten progressively worse over the past week. Wearing closed toe shoes makes it worse. Denies petechia on her lower extremities.  Please advies

## 2019-12-04 NOTE — Telephone Encounter (Signed)
I am happy to see her tomorrow if she would like to be evaluated. I will see if Sharyn Lull can call and get more details.    Sharyn Lull- Can you call her either today or in the morning to discuss her symptoms?  Faythe Casa, NP 12/04/2019 4:01 PM

## 2019-12-04 NOTE — Telephone Encounter (Signed)
Jenni please advise.

## 2019-12-05 ENCOUNTER — Inpatient Hospital Stay (HOSPITAL_BASED_OUTPATIENT_CLINIC_OR_DEPARTMENT_OTHER): Payer: 59 | Admitting: Oncology

## 2019-12-05 ENCOUNTER — Telehealth: Payer: Self-pay | Admitting: *Deleted

## 2019-12-05 ENCOUNTER — Other Ambulatory Visit: Payer: Self-pay

## 2019-12-05 VITALS — Temp 98.5°F

## 2019-12-05 DIAGNOSIS — R530 Neoplastic (malignant) related fatigue: Secondary | ICD-10-CM | POA: Diagnosis not present

## 2019-12-05 DIAGNOSIS — Z8052 Family history of malignant neoplasm of bladder: Secondary | ICD-10-CM | POA: Diagnosis not present

## 2019-12-05 DIAGNOSIS — L03031 Cellulitis of right toe: Secondary | ICD-10-CM | POA: Diagnosis not present

## 2019-12-05 DIAGNOSIS — C50411 Malignant neoplasm of upper-outer quadrant of right female breast: Secondary | ICD-10-CM | POA: Diagnosis not present

## 2019-12-05 DIAGNOSIS — Z5112 Encounter for antineoplastic immunotherapy: Secondary | ICD-10-CM | POA: Diagnosis not present

## 2019-12-05 DIAGNOSIS — Z9221 Personal history of antineoplastic chemotherapy: Secondary | ICD-10-CM | POA: Diagnosis not present

## 2019-12-05 DIAGNOSIS — Z006 Encounter for examination for normal comparison and control in clinical research program: Secondary | ICD-10-CM | POA: Diagnosis not present

## 2019-12-05 DIAGNOSIS — Z170 Estrogen receptor positive status [ER+]: Secondary | ICD-10-CM | POA: Diagnosis not present

## 2019-12-05 DIAGNOSIS — L03032 Cellulitis of left toe: Secondary | ICD-10-CM

## 2019-12-05 DIAGNOSIS — R21 Rash and other nonspecific skin eruption: Secondary | ICD-10-CM | POA: Diagnosis not present

## 2019-12-05 DIAGNOSIS — G629 Polyneuropathy, unspecified: Secondary | ICD-10-CM | POA: Diagnosis not present

## 2019-12-05 DIAGNOSIS — Z87891 Personal history of nicotine dependence: Secondary | ICD-10-CM | POA: Diagnosis not present

## 2019-12-05 MED ORDER — CHLORHEXIDINE GLUCONATE 0.12 % MT SOLN: 15.0000 mL | Freq: Two times a day (BID) | OROMUCOSAL | 0 refills | Status: DC

## 2019-12-05 NOTE — Telephone Encounter (Signed)
Called pt back and discussed SE's of Taxol. Nail bed changes, cracking, peeling, discoloration. Sometimes nail come off. I offered a visit .Pt getting ready to go on vacation and would like [place] to just check them for her. Appt made for this am in [facility].

## 2019-12-05 NOTE — Progress Notes (Signed)
Pt completed Taxol on 11/14/19. She has started having nail color changes in her   Toes. Pt thought she was bleeding up under her nails. RN assessed. R great toenail is tender with color changes. Rest of toenails are darkening and turning black in spots.Instructed her to keep nails clean and dry. May soak in epsom salts.

## 2019-12-06 NOTE — Progress Notes (Signed)
Symptom Management Consult note [facility]  [phone] [phone]  Patient Care Team: Birdie Sons, MD as PCP - General (Family Medicine) [place] (Gynecology) Theodore Demark, RN as Oncology Nurse Navigator Cammie Sickle, MD as Consulting Physician (Hematology and Oncology)   Name of the patient: Connie Bradford  [id number]  Sep 27, 1960   Date of visit: 12/05/2019   Diagnosis-right breast cancer  Chief complaint/ Reason for visit-nail discoloration  Heme/Onc history:  Oncology History Overview Note  # MARCH 2021- RUOQ- 21mm- [BREAST, RIGHT, LATERAL POSTERIOR  - INVASIVE MAMMARY CARCINOMA, NO SPECIAL TYPE, ASSOCIATED WITH  CALCIFICATIONS.[place]; ER-> 90%; PR- 50-90%; HER 2 FISH- POSITIVE  # S/P LUMPECTOMY [Dr.Cintron] pT1a [26mm];pN0 [STAGE IA- ER/PRpoS; Her 2 POS]  . BREAST, RIGHT, LATERAL MIDDLE DEPTH (RIBBON-SHAPED CLIP):  STEREOTACTIC-GUIDED CORE BIOPSY: - DUCTAL CARCINOMA IN SITU (DCIS), HIGH-GRADE, ASSOCIATED WITH  CALCIFICATIONS.   # May 14th,2021- TH-H  #Hypothyroidism-methimazole [Dr.Solum]  # SURVIVORSHIP:   # GENETICS:   DIAGNOSIS: Breast cancer  STAGE:   1      ;  GOALS: Cure  CURRENT/MOST RECENT THERAPY : Taxol Herceptin   Carcinoma of upper-outer quadrant of right breast in female, estrogen receptor positive ([place])  07/15/2019 Initial Diagnosis   Carcinoma of upper-outer quadrant of right breast in female, estrogen receptor positive ([place])   08/28/2019 -  Chemotherapy   The patient had PACLitaxel (TAXOL) 156 mg in sodium chloride 0.9 % 250 mL chemo infusion (</= 80mg /m2), 80 mg/m2 = 156 mg, Intravenous,  Once, 3 of 3 cycles Administration: 156 mg (08/28/2019), 156 mg (09/04/2019), 156 mg (09/25/2019), 156 mg (09/11/2019), 156 mg (09/18/2019), 156 mg (10/02/2019), 156 mg (10/09/2019), 156 mg (10/16/2019), 156 mg (10/23/2019), 156 mg (10/30/2019), 156 mg (11/06/2019), 156 mg (11/14/2019) trastuzumab-anns (KANJINTI)  336 mg in sodium chloride 0.9 % 250 mL chemo infusion, 4 mg/kg = 336 mg (100 % of original dose 4 mg/kg), Intravenous,  Once, 4 of 16 cycles Dose modification: 4 mg/kg (original dose 4 mg/kg, Cycle 1, Reason: Other (see comments), Comment: loading dose, biosimilar per insurance) Administration: 336 mg (08/28/2019), 150 mg (09/04/2019), 150 mg (09/25/2019), 150 mg (11/20/2019), 150 mg (09/11/2019), 150 mg (09/18/2019), 150 mg (10/02/2019), 150 mg (10/09/2019), 150 mg (10/16/2019), 150 mg (10/23/2019), 150 mg (10/30/2019), 150 mg (11/06/2019), 150 mg (11/14/2019)  for chemotherapy treatment.     Interval history-Connie Bradford is a 59 year old female with past medical history significant for hypothyroidism, Graves' disease, prediabetes with recent diagnosis of stage I right ER PR positive breast cancer.  She is status post lumpectomy.  She received 12 cycles of Herceptin Taxol on 11/14/2019.  She is now on maintenance Herceptin.  Last treatment given on 11/20/2019.  Today she presents to symptom management for concern of discoloration to her toes.  Have been progressive over the past several weeks.  Nails do not appear to be bothering her or causing her any discomfort.  She denies any additional concerns.   ECOG FS:0 - Asymptomatic  Review of systems- Review of Systems  Constitutional: Negative.  Negative for chills, fever, malaise/fatigue and weight loss.  HENT: Negative for congestion, ear pain and tinnitus.   Eyes: Negative.  Negative for blurred vision and double vision.  Respiratory: Negative.  Negative for cough, sputum production and shortness of breath.   Cardiovascular: Negative.  Negative for chest pain, palpitations and leg swelling.  Gastrointestinal: Negative.  Negative for abdominal pain, constipation, diarrhea, nausea and vomiting.  Genitourinary: Negative for dysuria,  frequency and urgency.  Musculoskeletal: Negative for back pain and falls.  Skin: Negative.  Negative for rash.       Nail changes    Neurological: Negative.  Negative for weakness and headaches.  Endo/Heme/Allergies: Negative.  Does not bruise/bleed easily.  Psychiatric/Behavioral: Negative.  Negative for depression. The patient is not nervous/anxious and does not have insomnia.      Current treatment-maintenance Herceptin  Allergies  Allergen Reactions   Penicillins Rash     Past Medical History:  Diagnosis Date   Atypical mole 09/26/2018   right mid anterior side/mild   Basal cell carcinoma 08/25/2014   right med cheek infraorbital   Breast cancer ([place]) 07/2019   [place] and DCIS   Dyslipidemia    Family history of bladder cancer    Graves disease    Hyperthyroidism    Inborn lipid storage disorder    Leiomyoma of uterus    Vitamin D deficiency      Past Surgical History:  Procedure Laterality Date   APPENDECTOMY  1982   Cyst removed from ovaries    BREAST BIOPSY Right 07/05/2019   stereo bx, coil clip, [facility]   BREAST BIOPSY Right 07/05/2019   stereo bx, ribbon clip, DC[facility]    BREAST LUMPECTOMY Right 07/31/2019    [name] and DCIS with SN bx   COLONOSCOPY WITH PROPOFOL N/A 01/20/2017   Procedure: COLONOSCOPY WITH PROPOFOL;  Surgeon: Jonathon Bellows, MD;  Location: [facility] ENDOSCOPY;  Service: Gastroenterology;  Laterality: N/A;   [place]   NECK SURGERY  2013   LIft from weight loss   PARTIAL MASTECTOMY WITH NEEDLE LOCALIZATION AND AXILLARY SENTINEL LYMPH NODE BX Right 07/31/2019   Procedure: RIGHT PARTIAL MASTECTOMY WITH BRACKETED NEEDLE LOCALIZATION AND LEFT AXILLARY SENTINEL LYMPH NODE BIOPSY;  Surgeon: Herbert Pun, MD;  Location: [facility] ORS;  Service: General;  Laterality: Right;   PORTACATH PLACEMENT Left 07/31/2019   Procedure: INSERTION PORT-A-CATH LEFT INTERNAL JUGULAR;  Surgeon: Herbert Pun, MD;  Location: [facility] ORS;  Service: General;  Laterality: Left;   TONSILLECTOMY  1969    Social History   Socioeconomic History   Marital  status: Married    Spouse name: Not on file   Number of children: 0   Years of education: Coll Grad   Highest education level: Not on file  Occupational History   Occupation: Full-Time    Comment: [employer] x9  Tobacco Use   Smoking status: Former Smoker   Smokeless tobacco: Never Used  Scientific laboratory technician Use: Never used  Substance and Sexual Activity   Alcohol use: Yes    Alcohol/week: 0.0 standard drinks    Comment: RARELY   Drug use: No   Sexual activity: Yes    Birth control/protection: Post-menopausal  Other Topics Concern   Not on file  Social History Narrative   Lives close to [place]; with husband. Quit smoking in 2007 [25 years]; ocassional alcohol. 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    Social Determinants of Health   Financial Resource Strain:    Difficulty of Paying Living Expenses: Not on file  Food Insecurity:    Worried About Charity fundraiser in the Last Year: Not on file   [employer] of Food in the Last Year: Not on file  Transportation Needs:    Lack of Transportation (Medical): Not on file   Lack of Transportation (Non-Medical): Not on file  Physical Activity:    Days of Exercise per Week: Not  on file   Minutes of Exercise per Session: Not on file  Stress:    Feeling of Stress : Not on file  Social Connections:    Frequency of Communication with Friends and Family: Not on file   Frequency of Social Gatherings with Friends and Family: Not on file   Attends Religious Services: Not on file   Active Member of Clubs or Organizations: Not on file   Attends Archivist Meetings: Not on file   Marital Status: Not on file  Intimate Partner Violence:    Fear of Current or Ex-Partner: Not on file   Emotionally Abused: Not on file   Physically Abused: Not on file   Sexually Abused: Not on file    Family History  Problem Relation Age of Onset   Bladder Cancer Mother    Diabetes Father    Heart disease Father     Bladder Cancer Brother    Breast cancer Neg Hx      Current Outpatient Medications:    B COMPLEX VITAMINS PO, Take 1 tablet by mouth daily. , Disp: , Rfl:    Cholecalciferol (VITAMIN D) 125 MCG (5000 UT) CAPS, Take 5,000 Units by mouth daily. , Disp: , Rfl:    COLLAGEN PO, Take 1 tablet by mouth daily. Super Collagen w/Biotin, Disp: , Rfl:    ibuprofen (ADVIL) 200 MG tablet, Take 600 mg by mouth every 8 (eight) hours as needed (for pain.). , Disp: , Rfl:    lidocaine-prilocaine (EMLA) cream, Apply 1 application topically as needed., Disp: 30 g, Rfl: 0   methimazole (TAPAZOLE) 5 MG tablet, Take 5 mg by mouth daily. , Disp: , Rfl:    Multiple Vitamin (MULTIVITAMIN WITH MINERALS) TABS tablet, Take 1 tablet by mouth daily., Disp: , Rfl:    Omega 3-6-9 Fatty Acids (OMEGA 3-6-9 COMPLEX) CAPS, Take 1 capsule by mouth daily., Disp: , Rfl:    valACYclovir (VALTREX) 500 MG tablet, Take 500 mg by mouth 2 (two) times a week. , Disp: , Rfl:    chlorhexidine (PERIDEX) 0.12 % solution, Use as directed 15 mLs in the mouth or throat 2 (two) times daily., Disp: 240 mL, Rfl: 0   clindamycin (CLEOCIN-T) 1 % lotion, Apply topically daily. (Patient not taking: Reported on 11/19/2019), Disp: 60 mL, Rfl: 11   ondansetron (ZOFRAN) 8 MG tablet, One pill every 8 hours as needed for nausea/vomitting. (Patient not taking: Reported on 12/05/2019), Disp: 40 tablet, Rfl: 1   prochlorperazine (COMPAZINE) 10 MG tablet, Take 1 tablet (10 mg total) by mouth every 6 (six) hours as needed for nausea or vomiting. (Patient not taking: Reported on 12/05/2019), Disp: 40 tablet, Rfl: 1  Physical exam:  Vitals:   12/05/19 1042  Temp: 98.5 F (36.9 C)  TempSrc: Tympanic   Physical Exam Constitutional:      Appearance: Normal appearance.  HENT:     Head: Normocephalic and atraumatic.  Eyes:     Pupils: Pupils are equal, round, and reactive to light.  Cardiovascular:     Rate and Rhythm: Normal rate and regular  rhythm.     Heart sounds: Normal heart sounds. No murmur heard.   Pulmonary:     Effort: Pulmonary effort is normal.     Breath sounds: Normal breath sounds. No wheezing.  Abdominal:     General: Bowel sounds are normal. There is no distension.     Palpations: Abdomen is soft.     Tenderness: There is no abdominal tenderness.  Musculoskeletal:        General: Normal range of motion.     Cervical back: Normal range of motion.  Feet:     Comments: Nail discoloration see picture below Skin:    General: Skin is warm and dry.     Findings: No rash.  Neurological:     Mental Status: She is alert and oriented to person, place, and time.  Psychiatric:        Judgment: Judgment normal.      CMP Latest Ref Rng & Units 11/20/2019  Glucose 70 - 99 mg/dL 98  BUN 6 - 20 mg/dL 22(H)  Creatinine 0.44 - 1.00 mg/dL 0.83  Sodium 135 - 145 mmol/L 139  Potassium 3.5 - 5.1 mmol/L 4.0  Chloride 98 - 111 mmol/L 107  CO2 22 - 32 mmol/L 24  Calcium 8.9 - 10.3 mg/dL 9.6  Total Protein 6.5 - 8.1 g/dL 6.7  Total Bilirubin 0.3 - 1.2 mg/dL 0.8  Alkaline Phos 38 - 126 U/L 72  AST 15 - 41 U/L 32  ALT 0 - 44 U/L 36   CBC Latest Ref Rng & Units 11/20/2019  WBC 4.0 - 10.5 K/uL 4.5  Hemoglobin 12.0 - 15.0 g/dL 10.6(L)  Hematocrit 36 - 46 % 33.0(L)  Platelets 150 - 400 K/uL 291      No results found.   Assessment and plan- Patient is a 59 y.o. female who presents to symptom management for now discoloration secondary to treatment with Taxol.  She recently completed 12 cycles of Taxol/Herceptin.  She is currently on maintenance Herceptin.  Breast cancer-status post lumpectomy and recently completed Taxol/Herceptin.  Maintenance Herceptin.  Followed by Dr. Rogue Bussing.  Paronychia-mild nail discoloration.  No pain or tenderness.  No abscess.  Recommend Epson salt and chlorhexidine soak several times daily.  Keep nails clean and dry.  We discussed treatment for acute paronychia which includes local skin  care measures with topical or oral antibiotics depending on the severity of inflammation in the presence of an abscess or an ingrown toenail.  Given patient has a very mild case, would recommend soaking her toenails in warm water and chlorhexidine solution or Epson salt.  I also asked her to be very mindful of injuring her toes or toenails.  She will let us know if she has worsening symptoms.  Plan: Rx chlorhexidine solution.  Use 10 to 20 mL of chlorhexidine in.  Sit for 10 to 15 minutes at a time.  Can add Epson salt.  Need to monitor.  Disposition: RTC on 12/18/2019 for labs, MD assessment and maintenance Herceptin.   Visit Diagnosis 1. Carcinoma of upper-outer quadrant of right breast in female, estrogen receptor positive ([place])   2. Paronychia of toe of both feet     Patient expressed understanding and was in agreement with this plan. She also understands that She can call clinic at any time with any questions, concerns, or complaints.   Greater than 50% was spent in counseling and coordination of care with this patient including but not limited to discussion of the relevant topics above (See A&P) including, but not limited to diagnosis and management of acute and chronic medical conditions.   Thank you for allowing me to participate in the care of this very pleasant patient.    Jacquelin Hawking, NP [place] [facility] Cell - [phone] Pager- [phone] 12/06/2019 12:25 PM

## 2019-12-08 DIAGNOSIS — Z170 Estrogen receptor positive status [ER+]: Secondary | ICD-10-CM | POA: Diagnosis not present

## 2019-12-08 DIAGNOSIS — C50411 Malignant neoplasm of upper-outer quadrant of right female breast: Secondary | ICD-10-CM | POA: Diagnosis not present

## 2019-12-17 ENCOUNTER — Encounter: Payer: Self-pay | Admitting: Internal Medicine

## 2019-12-17 NOTE — Progress Notes (Signed)
Patient called for pre assessment. She states that about a week ago she broke out in some itchy bumps on right side breast and right side of upper abdomen. She states bumps are painful and itch. She has been using some anti itch ointment/cream and tylenol. She is concerned this could be shingles but is not sure. She denies other questions or concerns at this time.

## 2019-12-18 ENCOUNTER — Inpatient Hospital Stay: Payer: 59

## 2019-12-18 ENCOUNTER — Inpatient Hospital Stay (HOSPITAL_BASED_OUTPATIENT_CLINIC_OR_DEPARTMENT_OTHER): Payer: 59 | Admitting: Internal Medicine

## 2019-12-18 ENCOUNTER — Ambulatory Visit: Admission: RE | Admit: 2019-12-18 | Payer: 59 | Source: Ambulatory Visit

## 2019-12-18 ENCOUNTER — Other Ambulatory Visit: Payer: Self-pay

## 2019-12-18 ENCOUNTER — Encounter: Payer: Self-pay | Admitting: Internal Medicine

## 2019-12-18 DIAGNOSIS — Z510 Encounter for antineoplastic radiation therapy: Secondary | ICD-10-CM | POA: Diagnosis not present

## 2019-12-18 DIAGNOSIS — Z5112 Encounter for antineoplastic immunotherapy: Secondary | ICD-10-CM | POA: Insufficient documentation

## 2019-12-18 DIAGNOSIS — Z170 Estrogen receptor positive status [ER+]: Secondary | ICD-10-CM

## 2019-12-18 DIAGNOSIS — C50411 Malignant neoplasm of upper-outer quadrant of right female breast: Secondary | ICD-10-CM | POA: Diagnosis not present

## 2019-12-18 DIAGNOSIS — Z87891 Personal history of nicotine dependence: Secondary | ICD-10-CM | POA: Insufficient documentation

## 2019-12-18 DIAGNOSIS — B029 Zoster without complications: Secondary | ICD-10-CM | POA: Insufficient documentation

## 2019-12-18 LAB — COMPREHENSIVE METABOLIC PANEL
ALT: 35 U/L (ref 0–44)
AST: 36 U/L (ref 15–41)
Albumin: 4 g/dL (ref 3.5–5.0)
Alkaline Phosphatase: 76 U/L (ref 38–126)
Anion gap: 10 (ref 5–15)
BUN: 14 mg/dL (ref 6–20)
CO2: 24 mmol/L (ref 22–32)
Calcium: 9 mg/dL (ref 8.9–10.3)
Chloride: 109 mmol/L (ref 98–111)
Creatinine, Ser: 0.72 mg/dL (ref 0.44–1.00)
GFR calc Af Amer: >60 mL/min (ref >60)
GFR calc non Af Amer: >60 mL/min (ref >60)
Glucose, Bld: 105 mg/dL — ABNORMAL HIGH (ref 70–99)
Potassium: 4.1 mmol/L (ref 3.5–5.1)
Sodium: 143 mmol/L (ref 135–145)
Total Bilirubin: 0.5 mg/dL (ref 0.3–1.2)
Total Protein: 6.6 g/dL (ref 6.5–8.1)

## 2019-12-18 LAB — CBC WITH DIFFERENTIAL/PLATELET
Abs Immature Granulocytes: 0.02 10*3/uL (ref 0.00–0.07)
Basophils Absolute: 0.1 10*3/uL (ref 0.0–0.1)
Basophils Relative: 2 %
Eosinophils Absolute: 0.4 10*3/uL (ref 0.0–0.5)
Eosinophils Relative: 7 %
HCT: 32.8 % — ABNORMAL LOW (ref 36.0–46.0)
Hemoglobin: 10.3 g/dL — ABNORMAL LOW (ref 12.0–15.0)
Immature Granulocytes: 0 %
Lymphocytes Relative: 23 %
Lymphs Abs: 1.1 10*3/uL (ref 0.7–4.0)
MCH: 28.5 pg (ref 26.0–34.0)
MCHC: 31.4 g/dL (ref 30.0–36.0)
MCV: 90.9 fL (ref 80.0–100.0)
Monocytes Absolute: 0.5 10*3/uL (ref 0.1–1.0)
Monocytes Relative: 11 %
Neutro Abs: 2.9 10*3/uL (ref 1.7–7.7)
Neutrophils Relative %: 57 %
Platelets: 226 10*3/uL (ref 150–400)
RBC: 3.61 MIL/uL — ABNORMAL LOW (ref 3.87–5.11)
RDW: 14.9 % (ref 11.5–15.5)
WBC: 5.1 10*3/uL (ref 4.0–10.5)
nRBC: 0 % (ref 0.0–0.2)

## 2019-12-18 MED ORDER — HEPARIN SOD (PORK) LOCK FLUSH 100 UNIT/ML IV SOLN
INTRAVENOUS | Status: AC
  Filled 2019-12-18: qty 5

## 2019-12-18 MED ORDER — SODIUM CHLORIDE 0.9% FLUSH
10.0000 mL | INTRAVENOUS | Status: DC | PRN
  Administered 2019-12-18: 10 mL via INTRAVENOUS
  Filled 2019-12-18: qty 10

## 2019-12-18 MED ORDER — HEPARIN SOD (PORK) LOCK FLUSH 100 UNIT/ML IV SOLN
500.0000 [IU] | Freq: Once | INTRAVENOUS | Status: AC
  Administered 2019-12-18: 500 [IU] via INTRAVENOUS
  Filled 2019-12-18: qty 5

## 2019-12-18 NOTE — Patient Instructions (Signed)
Recommend VALTREX 1gm [2 pills  Twice a day x 5 days. And then then 1 pill twice a week schedule.

## 2019-12-18 NOTE — Progress Notes (Signed)
[facility] [place] NOTE  Patient Care Team: Birdie Sons, MD as PCP - General (Family Medicine) [facility] Gyn (Gynecology) Theodore Demark, RN as Oncology Nurse Navigator Cammie Sickle, MD as Consulting Physician (Hematology and Oncology)  CHIEF COMPLAINTS/PURPOSE OF CONSULTATION: Breast cancer  #  Oncology History Overview Note  # MARCH 2021- RUOQ- 56m- [BREAST, RIGHT, LATERAL POSTERIOR  - INVASIVE MAMMARY CARCINOMA, NO SPECIAL TYPE, ASSOCIATED WITH  CALCIFICATIONS.][place] ER-> 90%; PR- 50-90%; HER 2 FISH- POSITIVE  # S/P LUMPECTOMY [Dr.Cintron] pT1a [428m;pN0 [STAGE IA- ER/PRpoS; Her 2 POS]  . BREAST, RIGHT, LATERAL MIDDLE DEPTH (RIBBON-SHAPED CLIP):  STEREOTACTIC-GUIDED CORE BIOPSY: - DUCTAL CARCINOMA IN SITU (DCIS), HIGH-GRADE, ASSOCIATED WITH  CALCIFICATIONS.   # May 14th,2021- TH-H  #Hypothyroidism-methimazole [Dr.Solum]  # SURVIVORSHIP:   # GENETICS:   DIAGNOSIS: Breast cancer  STAGE:   1      ;  GOALS: Cure  CURRENT/MOST RECENT THERAPY : Taxol Herceptin   Carcinoma of upper-outer quadrant of right breast in female, estrogen receptor positive ([facility] 07/15/2019 Initial Diagnosis   Carcinoma of upper-outer quadrant of right breast in female, estrogen receptor positive ([facility]  08/28/2019 -  Chemotherapy   The patient had PACLitaxel (TAXOL) 156 mg in sodium chloride 0.9 % 250 mL chemo infusion (</= 8045m2), 80 mg/m2 = 156 mg, Intravenous,  Once, 3 of 3 cycles Administration: 156 mg (08/28/2019), 156 mg (09/04/2019), 156 mg (09/25/2019), 156 mg (09/11/2019), 156 mg (09/18/2019), 156 mg (10/02/2019), 156 mg (10/09/2019), 156 mg (10/16/2019), 156 mg (10/23/2019), 156 mg (10/30/2019), 156 mg (11/06/2019), 156 mg (11/14/2019) trastuzumab-anns (KANJINTI) 336 mg in sodium chloride 0.9 % 250 mL chemo infusion, 4 mg/kg = 336 mg (100 % of original dose 4 mg/kg), Intravenous,  Once, 4 of 16 cycles Dose modification: 4 mg/kg (original dose 4 mg/kg, Cycle 1, Reason: Other  (see comments), Comment: loading dose, biosimilar per insurance) Administration: 336 mg (08/28/2019), 150 mg (09/04/2019), 150 mg (09/25/2019), 150 mg (11/20/2019), 150 mg (09/11/2019), 150 mg (09/18/2019), 150 mg (10/02/2019), 150 mg (10/09/2019), 150 mg (10/16/2019), 150 mg (10/23/2019), 150 mg (10/30/2019), 150 mg (11/06/2019), 150 mg (11/14/2019)  for chemotherapy treatment.      HISTORY OF PRESENTING ILLNESS:  [name] 59 39o.  female with breast cancer early stage-ER/PR positive HER-2/neu positive on adjuvant  Herceptin maintenance is here for follow-up.  Patient complains of a rash right lateral chest wall under the breast going to her back.  Hurting; itchy.  Patient previously had been taking Valtrex secondary prophylaxis.  And since the rash started patient start taking 5 mg Valtrex once a day.  The rash is getting better not resolved.  Otherwise no headaches.  No nausea no vomiting.  No new lumps or bumps.  Mild tingling and numbness in the extremities not any worse.  [place] otherwise [place].  Review of Systems  Constitutional: Positive for malaise/fatigue. Negative for chills, diaphoresis, fever and weight loss.  HENT: Negative for nosebleeds and sore throat.   Eyes: Negative for double vision.  Respiratory: Negative for cough, hemoptysis, sputum production, shortness of breath and wheezing.   Cardiovascular: Negative for chest pain, palpitations, orthopnea and leg swelling.  Gastrointestinal: Negative for abdominal pain, blood in stool, constipation, diarrhea, heartburn, melena, nausea and vomiting.  Genitourinary: Negative for dysuria, frequency and urgency.  Musculoskeletal: Negative for back pain and joint pain.  Skin: Positive for rash. Negative for itching.  Neurological: Positive for tingling. Negative for dizziness,  focal weakness, weakness and headaches.  Endo/Heme/Allergies: Does not bruise/bleed easily.  Psychiatric/Behavioral:  Negative for depression. The patient is not nervous/anxious and does not have insomnia.      MEDICAL HISTORY:  Past Medical History:  Diagnosis Date  . Atypical mole 09/26/2018   right mid anterior side/mild  . Basal cell carcinoma 08/25/2014   right med cheek infraorbital  . Breast cancer ([place]) 07/2019   [place] and DCIS  . Dyslipidemia   . Family history of bladder cancer   . Graves disease   . Hyperthyroidism   . Inborn lipid storage disorder   . Leiomyoma of uterus   . Vitamin D deficiency     SURGICAL HISTORY: Past Surgical History:  Procedure Laterality Date  . APPENDECTOMY  1982   Cyst removed from ovaries   . BREAST BIOPSY Right 07/05/2019   stereo bx, coil clip, IMC  . BREAST BIOPSY Right 07/05/2019   stereo bx, ribbon clip, DCIS   . BREAST LUMPECTOMY Right 07/31/2019    [facility] and DCIS with SN bx  . COLONOSCOPY WITH PROPOFOL N/A 01/20/2017   Procedure: COLONOSCOPY WITH PROPOFOL;  Surgeon: Jonathon Bellows, MD;  Location: [facility] ENDOSCOPY;  Service: Gastroenterology;  Laterality: N/A;  . [place]  . NECK SURGERY  2013   LIft from weight loss  . PARTIAL MASTECTOMY WITH NEEDLE LOCALIZATION AND AXILLARY SENTINEL LYMPH NODE BX Right 07/31/2019   Procedure: RIGHT PARTIAL MASTECTOMY WITH BRACKETED NEEDLE LOCALIZATION AND LEFT AXILLARY SENTINEL LYMPH NODE BIOPSY;  Surgeon: Herbert Pun, MD;  Location: [facility] ORS;  Service: General;  Laterality: Right;  . PORTACATH PLACEMENT Left 07/31/2019   Procedure: INSERTION PORT-A-CATH LEFT INTERNAL JUGULAR;  Surgeon: Herbert Pun, MD;  Location: [facility] ORS;  Service: General;  Laterality: Left;  . TONSILLECTOMY  1969    SOCIAL HISTORY: Social History   Socioeconomic History  . Marital status: Married    Spouse name: Not on file  . Number of children: 0  . Years of education: Anselm Lis  . Highest education level: Not on file  Occupational History  . Occupation: Full-Time    Comment: [employer] x9  Tobacco Use  . Smoking status: Former Research scientist (life sciences)  . Smokeless tobacco: Never Used  Vaping Use  . Vaping Use: Never used  Substance and Sexual Activity  . Alcohol use: Yes    Alcohol/week: 0.0 standard drinks    Comment: RARELY  . Drug use: No  . Sexual activity: Yes    Birth control/protection: Post-menopausal  Other Topics Concern  . Not on file  Social History Narrative   Lives close to [place]; with husband. Quit smoking in 2007 [25 years]; ocassional alcohol. 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    Social Determinants of Health   Financial Resource Strain:   . Difficulty of Paying Living Expenses: Not on file  Food Insecurity:   . Worried About Charity fundraiser in the Last Year: Not on file  . Ran Out of Food in the Last Year: Not on file  Transportation Needs:   . Lack of Transportation (Medical): Not on file  . Lack of Transportation (Non-Medical): Not on file  Physical Activity:   . Days of Exercise per Week: Not on file  . Minutes of Exercise per Session: Not on file  Stress:   . Feeling of Stress : Not on file  Social Connections:   . Frequency of Communication with Friends and Family: Not on file  . Frequency of Social  Gatherings with Friends and Family: Not on file  . Attends Religious Services: Not on file  . Active Member of Clubs or Organizations: Not on file  . Attends Archivist Meetings: Not on file  . Marital Status: Not on file  Intimate Partner Violence:   . Fear of Current or Ex-Partner: Not on file  . Emotionally Abused: Not on file  . Physically Abused: Not on file  . Sexually Abused: Not on file    FAMILY HISTORY: Family History  Problem Relation Age of Onset  . Bladder Cancer Mother   . Diabetes Father   . Heart disease Father   . Bladder Cancer Brother   . Breast cancer Neg Hx     ALLERGIES:  is allergic to penicillins.  MEDICATIONS:  Current Outpatient Medications  Medication Sig Dispense Refill  . B COMPLEX VITAMINS  PO Take 1 tablet by mouth daily.     . chlorhexidine (PERIDEX) 0.12 % solution Use as directed 15 mLs in the mouth or throat 2 (two) times daily. 240 mL 0  . Cholecalciferol (VITAMIN D) 125 MCG (5000 UT) CAPS Take 5,000 Units by mouth daily.     . COLLAGEN PO Take 1 tablet by mouth daily. Super Collagen w/Biotin    . ibuprofen (ADVIL) 200 MG tablet Take 600 mg by mouth every 8 (eight) hours as needed (for pain.).     Marland Kitchen lidocaine-prilocaine (EMLA) cream Apply 1 application topically as needed. 30 g 0  . methimazole (TAPAZOLE) 5 MG tablet Take 5 mg by mouth daily.     . Multiple Vitamin (MULTIVITAMIN WITH MINERALS) TABS tablet Take 1 tablet by mouth daily.    . Omega 3-6-9 Fatty Acids (OMEGA 3-6-9 COMPLEX) CAPS Take 1 capsule by mouth daily.    . ondansetron (ZOFRAN) 8 MG tablet One pill every 8 hours as needed for nausea/vomitting. 40 tablet 1  . prochlorperazine (COMPAZINE) 10 MG tablet Take 1 tablet (10 mg total) by mouth every 6 (six) hours as needed for nausea or vomiting. 40 tablet 1  . valACYclovir (VALTREX) 500 MG tablet Take 500 mg by mouth 2 (two) times a week.      No current facility-administered medications for this visit.   Facility-Administered Medications Ordered in Other Visits  Medication Dose Route Frequency Provider Last Rate Last Admin  . heparin lock flush 100 unit/mL  500 Units Intravenous Once [name] R, MD      . sodium chloride flush (NS) 0.9 % injection 10 mL  10 mL Intravenous PRN Cammie Sickle, MD   10 mL at 12/18/19 0840    PHYSICAL EXAMINATION: ECOG PERFORMANCE STATUS: 0 - Asymptomatic  Vitals:   12/18/19 0846  BP: (!) 142/73  Pulse: 71  Resp: 16  Temp: 98.1 F (36.7 C)  SpO2: 100%   Filed Weights   12/18/19 0846  Weight: 178 lb (80.7 kg)    Physical Exam HENT:     Head: Normocephalic and atraumatic.     Mouth/Throat:     Pharynx: No oropharyngeal exudate.  Eyes:     Pupils: Pupils are equal, round, and reactive to light.   Cardiovascular:     Rate and Rhythm: Normal rate and regular rhythm.  Pulmonary:     Effort: Pulmonary effort is normal. No respiratory distress.     Breath sounds: Normal breath sounds. No wheezing.  Abdominal:     General: Bowel sounds are normal. There is no distension.     Palpations: Abdomen is  soft. There is no mass.     Tenderness: There is no abdominal tenderness. There is no guarding or rebound.  Musculoskeletal:        General: No tenderness. Normal range of motion.     Cervical back: Normal range of motion and neck supple.  Skin:    General: Skin is warm.     Comments: Faint maculopapular rash noted under the breast/right side going to the back.  No pustules or vesicles..  Neurological:     Mental Status: She is alert and oriented to person, place, and time.  Psychiatric:        Mood and Affect: Affect normal.    LABORATORY DATA:  I have reviewed the data as listed Lab Results  Component Value Date   WBC 5.1 12/18/2019   HGB 10.3 (L) 12/18/2019   HCT 32.8 (L) 12/18/2019   MCV 90.9 12/18/2019   PLT 226 12/18/2019   Recent Labs    11/06/19 0839 11/06/19 0839 11/14/19 0826 11/20/19 0903 12/18/19 0840  NA 140   < > 139 139 143  K 4.0   < > 4.0 4.0 4.1  CL 108   < > 107 107 109  CO2 25   < > '24 24 24  ' GLUCOSE 106*   < > 124* 98 105*  BUN 21*   < > 20 22* 14  CREATININE 0.73   < > 0.81 0.83 0.72  CALCIUM 9.2   < > 9.5 9.6 9.0  GFRNONAA >60   < > >60 >60 >60  GFRAA >60   < > >60 >60 >60  PROT 6.6  --   --  6.7 6.6  ALBUMIN 3.8  --   --  4.1 4.0  AST 27  --   --  32 36  ALT 36  --   --  36 35  ALKPHOS 69  --   --  72 76  BILITOT 0.6  --   --  0.8 0.5   < > = values in this interval not displayed.    RADIOGRAPHIC STUDIES: I have personally reviewed the radiological images as listed and agreed with the findings in the report. No results found. ASSESSMENT & PLAN:   Carcinoma of upper-outer quadrant of right breast in female, estrogen receptor positive  ([place]) #Right breast-invasive mammary carcinoma- S/P LUMPECTOMY pT1a [71m];pN0 [STAGE IA- ER/PRpoS; Her 2 POS]; STAGE I. S/p Taxol-Herceptin; currently on Herceptin maintenance  #Hold maintenance Herceptin labs today reviewed-given shingles see below/  # Right chest wall rash/Shingles-on Valtrex 500 mg once a day; recommend 1 gm BID x 5 days.     # Fatigue-grade 1; secondary chemotherapy- STABLE>    # SWOG study/PN study- PN- G-1-2; STABLE; monitor for now.  # Disposition: # HOLD Herceptin today; de-access #  follow up in 2 weeks- MD; labs- cbc/cmp; Herceptin- Dr.B   All questions were answered. The patient/family knows to call the clinic with any problems, questions or concerns.    GCammie Sickle MD 12/18/2019 9:24 AM

## 2019-12-18 NOTE — Assessment & Plan Note (Addendum)
#  Right breast-invasive mammary carcinoma- S/P LUMPECTOMY pT1a [28mm];pN0 [STAGE IA- ER/PRpoS; Her 2 POS]; STAGE I. S/p Taxol-Herceptin; currently on Herceptin maintenance  #Hold maintenance Herceptin labs today reviewed-given shingles see below/  # Right chest wall rash/Shingles-on Valtrex 500 mg once a day; recommend 1 gm BID x 5 days.     # Fatigue-grade 1; secondary chemotherapy- STABLE>    # SWOG study/PN study- PN- G-1-2; STABLE; monitor for now.  # Disposition: # HOLD Herceptin today; de-access #  follow up in 2 weeks- MD; labs- cbc/cmp; Herceptin- Dr.B

## 2019-12-19 ENCOUNTER — Ambulatory Visit
Admission: RE | Admit: 2019-12-19 | Discharge: 2019-12-19 | Disposition: A | Discharge status: Home / Self Care | Payer: 59 | Source: Ambulatory Visit | Attending: Radiation Oncology | Admitting: Radiation Oncology

## 2019-12-19 DIAGNOSIS — Z510 Encounter for antineoplastic radiation therapy: Secondary | ICD-10-CM | POA: Diagnosis not present

## 2019-12-19 DIAGNOSIS — C50411 Malignant neoplasm of upper-outer quadrant of right female breast: Secondary | ICD-10-CM | POA: Diagnosis not present

## 2019-12-19 DIAGNOSIS — Z170 Estrogen receptor positive status [ER+]: Secondary | ICD-10-CM | POA: Diagnosis not present

## 2019-12-20 ENCOUNTER — Ambulatory Visit
Admission: RE | Admit: 2019-12-20 | Discharge: 2019-12-20 | Disposition: A | Discharge status: Home / Self Care | Payer: 59 | Source: Ambulatory Visit | Attending: Radiation Oncology | Admitting: Radiation Oncology

## 2019-12-20 DIAGNOSIS — C50411 Malignant neoplasm of upper-outer quadrant of right female breast: Secondary | ICD-10-CM | POA: Diagnosis not present

## 2019-12-20 DIAGNOSIS — Z170 Estrogen receptor positive status [ER+]: Secondary | ICD-10-CM | POA: Diagnosis not present

## 2019-12-20 DIAGNOSIS — Z510 Encounter for antineoplastic radiation therapy: Secondary | ICD-10-CM | POA: Diagnosis not present

## 2019-12-23 ENCOUNTER — Other Ambulatory Visit: Payer: Self-pay | Admitting: Dermatology

## 2019-12-24 ENCOUNTER — Ambulatory Visit
Admission: RE | Admit: 2019-12-24 | Discharge: 2019-12-24 | Disposition: A | Discharge status: Home / Self Care | Payer: 59 | Source: Ambulatory Visit | Attending: Radiation Oncology | Admitting: Radiation Oncology

## 2019-12-24 DIAGNOSIS — Z170 Estrogen receptor positive status [ER+]: Secondary | ICD-10-CM | POA: Diagnosis not present

## 2019-12-24 DIAGNOSIS — Z510 Encounter for antineoplastic radiation therapy: Secondary | ICD-10-CM | POA: Diagnosis not present

## 2019-12-24 DIAGNOSIS — C50411 Malignant neoplasm of upper-outer quadrant of right female breast: Secondary | ICD-10-CM | POA: Diagnosis not present

## 2019-12-25 ENCOUNTER — Ambulatory Visit
Admission: RE | Admit: 2019-12-25 | Discharge: 2019-12-25 | Disposition: A | Discharge status: Home / Self Care | Payer: 59 | Source: Ambulatory Visit | Attending: Radiation Oncology | Admitting: Radiation Oncology

## 2019-12-25 DIAGNOSIS — Z510 Encounter for antineoplastic radiation therapy: Secondary | ICD-10-CM | POA: Diagnosis not present

## 2019-12-25 DIAGNOSIS — Z170 Estrogen receptor positive status [ER+]: Secondary | ICD-10-CM | POA: Diagnosis not present

## 2019-12-25 DIAGNOSIS — C50411 Malignant neoplasm of upper-outer quadrant of right female breast: Secondary | ICD-10-CM | POA: Diagnosis not present

## 2019-12-26 ENCOUNTER — Ambulatory Visit
Admission: RE | Admit: 2019-12-26 | Discharge: 2019-12-26 | Disposition: A | Discharge status: Home / Self Care | Payer: 59 | Source: Ambulatory Visit | Attending: Radiation Oncology | Admitting: Radiation Oncology

## 2019-12-26 DIAGNOSIS — Z510 Encounter for antineoplastic radiation therapy: Secondary | ICD-10-CM | POA: Diagnosis not present

## 2019-12-26 DIAGNOSIS — Z170 Estrogen receptor positive status [ER+]: Secondary | ICD-10-CM | POA: Diagnosis not present

## 2019-12-26 DIAGNOSIS — C50411 Malignant neoplasm of upper-outer quadrant of right female breast: Secondary | ICD-10-CM | POA: Diagnosis not present

## 2019-12-27 ENCOUNTER — Ambulatory Visit
Admission: RE | Admit: 2019-12-27 | Discharge: 2019-12-27 | Disposition: A | Discharge status: Home / Self Care | Payer: 59 | Source: Ambulatory Visit | Attending: Radiation Oncology | Admitting: Radiation Oncology

## 2019-12-27 DIAGNOSIS — Z170 Estrogen receptor positive status [ER+]: Secondary | ICD-10-CM | POA: Diagnosis not present

## 2019-12-27 DIAGNOSIS — C50411 Malignant neoplasm of upper-outer quadrant of right female breast: Secondary | ICD-10-CM | POA: Diagnosis not present

## 2019-12-27 DIAGNOSIS — Z510 Encounter for antineoplastic radiation therapy: Secondary | ICD-10-CM | POA: Diagnosis not present

## 2019-12-30 ENCOUNTER — Ambulatory Visit
Admission: RE | Admit: 2019-12-30 | Discharge: 2019-12-30 | Disposition: A | Discharge status: Home / Self Care | Payer: 59 | Source: Ambulatory Visit | Attending: Radiation Oncology | Admitting: Radiation Oncology

## 2019-12-30 DIAGNOSIS — Z510 Encounter for antineoplastic radiation therapy: Secondary | ICD-10-CM | POA: Diagnosis not present

## 2019-12-30 DIAGNOSIS — C50411 Malignant neoplasm of upper-outer quadrant of right female breast: Secondary | ICD-10-CM | POA: Diagnosis not present

## 2019-12-30 DIAGNOSIS — Z170 Estrogen receptor positive status [ER+]: Secondary | ICD-10-CM | POA: Diagnosis not present

## 2019-12-31 ENCOUNTER — Ambulatory Visit
Admission: RE | Admit: 2019-12-31 | Discharge: 2019-12-31 | Disposition: A | Discharge status: Home / Self Care | Payer: 59 | Source: Ambulatory Visit | Attending: Radiation Oncology | Admitting: Radiation Oncology

## 2019-12-31 DIAGNOSIS — Z510 Encounter for antineoplastic radiation therapy: Secondary | ICD-10-CM | POA: Diagnosis not present

## 2019-12-31 DIAGNOSIS — C50411 Malignant neoplasm of upper-outer quadrant of right female breast: Secondary | ICD-10-CM | POA: Diagnosis not present

## 2019-12-31 DIAGNOSIS — Z170 Estrogen receptor positive status [ER+]: Secondary | ICD-10-CM | POA: Diagnosis not present

## 2019-12-31 NOTE — Progress Notes (Signed)
1519- 12/31/19- Attempted to reach patient prior to her apt with Dr. Rogue Bussing - no answer. Unable to leave vm.

## 2020-01-01 ENCOUNTER — Inpatient Hospital Stay: Payer: 59

## 2020-01-01 ENCOUNTER — Other Ambulatory Visit: Payer: Self-pay

## 2020-01-01 ENCOUNTER — Inpatient Hospital Stay (HOSPITAL_BASED_OUTPATIENT_CLINIC_OR_DEPARTMENT_OTHER): Payer: 59 | Admitting: Internal Medicine

## 2020-01-01 ENCOUNTER — Ambulatory Visit
Admission: RE | Admit: 2020-01-01 | Discharge: 2020-01-01 | Disposition: A | Discharge status: Home / Self Care | Payer: 59 | Source: Ambulatory Visit | Attending: Radiation Oncology | Admitting: Radiation Oncology

## 2020-01-01 VITALS — BP 114/86 | HR 85 | Temp 98.6°F | Resp 16 | Ht 67.0 in | Wt 174.0 lb

## 2020-01-01 VITALS — BP 135/81 | HR 79 | Resp 16

## 2020-01-01 DIAGNOSIS — Z170 Estrogen receptor positive status [ER+]: Secondary | ICD-10-CM

## 2020-01-01 DIAGNOSIS — C50411 Malignant neoplasm of upper-outer quadrant of right female breast: Secondary | ICD-10-CM

## 2020-01-01 DIAGNOSIS — Z09 Encounter for follow-up examination after completed treatment for conditions other than malignant neoplasm: Secondary | ICD-10-CM

## 2020-01-01 DIAGNOSIS — Z510 Encounter for antineoplastic radiation therapy: Secondary | ICD-10-CM | POA: Diagnosis not present

## 2020-01-01 DIAGNOSIS — Z95828 Presence of other vascular implants and grafts: Secondary | ICD-10-CM

## 2020-01-01 LAB — CBC WITH DIFFERENTIAL/PLATELET
Abs Immature Granulocytes: 0.03 10*3/uL (ref 0.00–0.07)
Basophils Absolute: 0.1 10*3/uL (ref 0.0–0.1)
Basophils Relative: 2 %
Eosinophils Absolute: 0.4 10*3/uL (ref 0.0–0.5)
Eosinophils Relative: 6 %
HCT: 36.5 % (ref 36.0–46.0)
Hemoglobin: 11.7 g/dL — ABNORMAL LOW (ref 12.0–15.0)
Immature Granulocytes: 1 %
Lymphocytes Relative: 17 %
Lymphs Abs: 1.1 10*3/uL (ref 0.7–4.0)
MCH: 28.3 pg (ref 26.0–34.0)
MCHC: 32.1 g/dL (ref 30.0–36.0)
MCV: 88.4 fL (ref 80.0–100.0)
Monocytes Absolute: 0.8 10*3/uL (ref 0.1–1.0)
Monocytes Relative: 13 %
Neutro Abs: 3.8 10*3/uL (ref 1.7–7.7)
Neutrophils Relative %: 61 %
Platelets: 243 10*3/uL (ref 150–400)
RBC: 4.13 MIL/uL (ref 3.87–5.11)
RDW: 14.9 % (ref 11.5–15.5)
WBC: 6.1 10*3/uL (ref 4.0–10.5)
nRBC: 0 % (ref 0.0–0.2)

## 2020-01-01 LAB — COMPREHENSIVE METABOLIC PANEL
ALT: 39 U/L (ref 0–44)
AST: 39 U/L (ref 15–41)
Albumin: 4.2 g/dL (ref 3.5–5.0)
Alkaline Phosphatase: 76 U/L (ref 38–126)
Anion gap: 11 (ref 5–15)
BUN: 16 mg/dL (ref 6–20)
CO2: 24 mmol/L (ref 22–32)
Calcium: 9.5 mg/dL (ref 8.9–10.3)
Chloride: 104 mmol/L (ref 98–111)
Creatinine, Ser: 0.81 mg/dL (ref 0.44–1.00)
GFR calc Af Amer: >60 mL/min (ref >60)
GFR calc non Af Amer: >60 mL/min (ref >60)
Glucose, Bld: 106 mg/dL — ABNORMAL HIGH (ref 70–99)
Potassium: 4.1 mmol/L (ref 3.5–5.1)
Sodium: 139 mmol/L (ref 135–145)
Total Bilirubin: 0.8 mg/dL (ref 0.3–1.2)
Total Protein: 7.1 g/dL (ref 6.5–8.1)

## 2020-01-01 MED ORDER — SODIUM CHLORIDE 0.9% FLUSH
10.0000 mL | Freq: Once | INTRAVENOUS | Status: AC
  Administered 2020-01-01: 10 mL via INTRAVENOUS
  Filled 2020-01-01: qty 10

## 2020-01-01 MED ORDER — DIPHENHYDRAMINE HCL 25 MG PO CAPS
25.0000 mg | ORAL_CAPSULE | Freq: Once | ORAL | Status: AC
  Administered 2020-01-01: 25 mg via ORAL
  Filled 2020-01-01: qty 1

## 2020-01-01 MED ORDER — TRASTUZUMAB-ANNS CHEMO 150 MG IV SOLR
150.0000 mg | Freq: Once | INTRAVENOUS | Status: AC
  Administered 2020-01-01: 150 mg via INTRAVENOUS
  Filled 2020-01-01: qty 7.14

## 2020-01-01 MED ORDER — HEPARIN SOD (PORK) LOCK FLUSH 100 UNIT/ML IV SOLN
500.0000 [IU] | Freq: Once | INTRAVENOUS | Status: AC
  Administered 2020-01-01: 500 [IU] via INTRAVENOUS
  Filled 2020-01-01: qty 5

## 2020-01-01 MED ORDER — SODIUM CHLORIDE 0.9 % IV SOLN
Freq: Once | INTRAVENOUS | Status: AC
  Filled 2020-01-01: qty 250

## 2020-01-01 MED ORDER — HEPARIN SOD (PORK) LOCK FLUSH 100 UNIT/ML IV SOLN
INTRAVENOUS | Status: AC
  Filled 2020-01-01: qty 5

## 2020-01-01 MED ORDER — ACETAMINOPHEN 325 MG PO TABS
650.0000 mg | ORAL_TABLET | Freq: Once | ORAL | Status: AC
  Administered 2020-01-01: 650 mg via ORAL
  Filled 2020-01-01: qty 2

## 2020-01-01 NOTE — Progress Notes (Signed)
[facility] [place] NOTE  Patient Care Team: Birdie Sons, MD as PCP - General (Family Medicine) [facility] (Gynecology) Theodore Demark, RN as Oncology Nurse Navigator Cammie Sickle, MD as Consulting Physician (Hematology and Oncology)  CHIEF COMPLAINTS/PURPOSE OF CONSULTATION: Breast cancer  #  Oncology History Overview Note  # MARCH 2021- RUOQ- 54m- [BREAST, RIGHT, LATERAL POSTERIOR  - INVASIVE MAMMARY CARCINOMA, NO SPECIAL TYPE, ASSOCIATED WITH  CALCIFICATIONS.][place] ER-> 90%; PR- 50-90%; HER 2 FISH- POSITIVE  # S/P LUMPECTOMY [Dr.Cintron] pT1a [459m;pN0 [STAGE IA- ER/PRpoS; Her 2 POS]  . BREAST, RIGHT, LATERAL MIDDLE DEPTH (RIBBON-SHAPED CLIP):  STEREOTACTIC-GUIDED CORE BIOPSY: - DUCTAL CARCINOMA IN SITU (DCIS), HIGH-GRADE, ASSOCIATED WITH  CALCIFICATIONS.   # May 14th,2021- TH-H  #Hypothyroidism-methimazole [Dr.Solum]  # SURVIVORSHIP:   # GENETICS:   DIAGNOSIS: Breast cancer  STAGE:   1      ;  GOALS: Cure  CURRENT/MOST RECENT THERAPY : Taxol Herceptin   Carcinoma of upper-outer quadrant of right breast in female, estrogen receptor positive ([place] 07/15/2019 Initial Diagnosis   Carcinoma of upper-outer quadrant of right breast in female, estrogen receptor positive ([place]  08/28/2019 -  Chemotherapy   The patient had PACLitaxel (TAXOL) 156 mg in sodium chloride 0.9 % 250 mL chemo infusion (</= 8060m2), 80 mg/m2 = 156 mg, Intravenous,  Once, 3 of 3 cycles Administration: 156 mg (08/28/2019), 156 mg (09/04/2019), 156 mg (09/25/2019), 156 mg (09/11/2019), 156 mg (09/18/2019), 156 mg (10/02/2019), 156 mg (10/09/2019), 156 mg (10/16/2019), 156 mg (10/23/2019), 156 mg (10/30/2019), 156 mg (11/06/2019), 156 mg (11/14/2019) trastuzumab-anns (KANJINTI) 336 mg in sodium chloride 0.9 % 250 mL chemo infusion, 4 mg/kg = 336 mg (100 % of original dose 4 mg/kg), Intravenous,  Once, 4 of 16 cycles Dose modification: 4 mg/kg (original dose 4 mg/kg, Cycle 1, Reason: Other  (see comments), Comment: loading dose, biosimilar per insurance) Administration: 336 mg (08/28/2019), 150 mg (09/04/2019), 150 mg (09/25/2019), 150 mg (11/20/2019), 150 mg (09/11/2019), 150 mg (09/18/2019), 150 mg (10/02/2019), 150 mg (10/09/2019), 150 mg (10/16/2019), 150 mg (10/23/2019), 150 mg (10/30/2019), 150 mg (11/06/2019), 150 mg (11/14/2019)  for chemotherapy treatment.      HISTORY OF PRESENTING ILLNESS:  [name] [age]o.  female with breast cancer early stage-ER/PR positive HER-2/neu positive on adjuvant  Herceptin maintenance is here for follow-up.  Herceptin was held last week because of shingles noted on the right posterior back.  Patient treated with valacyclovir 1 g twice daily.  Notes a resolution of all the skin rash.  Patient is currently on valacyclovir 5 mg once a day.  Denies any headaches.  Denies any nausea vomiting.  Chronic mild tingling and numbness in lower extremities.  Review of Systems  Constitutional: Positive for malaise/fatigue. Negative for chills, diaphoresis, fever and weight loss.  HENT: Negative for nosebleeds and sore throat.   Eyes: Negative for double vision.  Respiratory: Negative for cough, hemoptysis, sputum production, shortness of breath and wheezing.   Cardiovascular: Negative for chest pain, palpitations, orthopnea and leg swelling.  Gastrointestinal: Negative for abdominal pain, blood in stool, constipation, diarrhea, heartburn, melena, nausea and vomiting.  Genitourinary: Negative for dysuria, frequency and urgency.  Musculoskeletal: Negative for back pain and joint pain.  Skin: Negative for itching.  Neurological: Positive for tingling. Negative for dizziness, focal weakness, weakness and headaches.  Endo/Heme/Allergies: Does not bruise/bleed easily.  Psychiatric/Behavioral: Negative for depression. The patient is not nervous/anxious and does not have insomnia.  MEDICAL HISTORY:  Past Medical History:  Diagnosis Date  . Atypical mole  09/26/2018   right mid anterior side/mild  . Basal cell carcinoma 08/25/2014   right med cheek infraorbital  . Breast cancer ([place]) 07/2019   [place] and DCIS  . Dyslipidemia   . Family history of bladder cancer   . Graves disease   . Hyperthyroidism   . Inborn lipid storage disorder   . Leiomyoma of uterus   . Vitamin D deficiency     SURGICAL HISTORY: Past Surgical History:  Procedure Laterality Date  . APPENDECTOMY  1982   Cyst removed from ovaries   . BREAST BIOPSY Right 07/05/2019   stereo bx, coil clip, [facility]  . BREAST BIOPSY Right 07/05/2019   stereo bx, ribbon clip, DCIS   . BREAST LUMPECTOMY Right 07/31/2019    [facility] and DCIS with SN bx  . COLONOSCOPY WITH PROPOFOL N/A 01/20/2017   Procedure: COLONOSCOPY WITH PROPOFOL;  Surgeon: Jonathon Bellows, MD;  Location: [facility] ENDOSCOPY;  Service: Gastroenterology;  Laterality: N/A;  . [place]  . NECK SURGERY  2013   LIft from weight loss  . PARTIAL MASTECTOMY WITH NEEDLE LOCALIZATION AND AXILLARY SENTINEL LYMPH NODE BX Right 07/31/2019   Procedure: RIGHT PARTIAL MASTECTOMY WITH BRACKETED NEEDLE LOCALIZATION AND LEFT AXILLARY SENTINEL LYMPH NODE BIOPSY;  Surgeon: Herbert Pun, MD;  Location: [facility] ORS;  Service: General;  Laterality: Right;  . PORTACATH PLACEMENT Left 07/31/2019   Procedure: INSERTION PORT-A-CATH LEFT INTERNAL JUGULAR;  Surgeon: Herbert Pun, MD;  Location: [facility] ORS;  Service: General;  Laterality: Left;  . TONSILLECTOMY  1969    SOCIAL HISTORY: Social History   Socioeconomic History  . Marital status: Married    Spouse name: Not on file  . Number of children: 0  . Years of education: Anselm Lis  . Highest education level: Not on file  Occupational History  . Occupation: Full-Time    Comment: [employer] x9  Tobacco Use  . Smoking status: Former Research scientist (life sciences)  . Smokeless tobacco: Never Used  Vaping Use  . Vaping Use: Never used  Substance and Sexual Activity   . Alcohol use: Yes    Alcohol/week: 0.0 standard drinks    Comment: RARELY  . Drug use: No  . Sexual activity: Yes    Birth control/protection: Post-menopausal  Other Topics Concern  . Not on file  Social History Narrative   Lives close to [place]; with husband. Quit smoking in 2007 [25 years]; ocassional alcohol. 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    Social Determinants of Health   Financial Resource Strain:   . Difficulty of Paying Living Expenses: Not on file  Food Insecurity:   . Worried About Charity fundraiser in the Last Year: Not on file  . Ran Out of Food in the Last Year: Not on file  Transportation Needs:   . Lack of Transportation (Medical): Not on file  . Lack of Transportation (Non-Medical): Not on file  Physical Activity:   . Days of Exercise per Week: Not on file  . Minutes of Exercise per Session: Not on file  Stress:   . Feeling of Stress : Not on file  Social Connections:   . Frequency of Communication with Friends and Family: Not on file  . Frequency of Social Gatherings with Friends and Family: Not on file  . Attends Religious Services: Not on file  . Active Member of Clubs or Organizations: Not on file  . Attends Club  or Organization Meetings: Not on file  . Marital Status: Not on file  Intimate Partner Violence:   . Fear of Current or Ex-Partner: Not on file  . Emotionally Abused: Not on file  . Physically Abused: Not on file  . Sexually Abused: Not on file    FAMILY HISTORY: Family History  Problem Relation Age of Onset  . Bladder Cancer Mother   . Diabetes Father   . Heart disease Father   . Bladder Cancer Brother   . Breast cancer Neg Hx     ALLERGIES:  is allergic to penicillins.  MEDICATIONS:  Current Outpatient Medications  Medication Sig Dispense Refill  . B COMPLEX VITAMINS PO Take 1 tablet by mouth daily.     . chlorhexidine (PERIDEX) 0.12 % solution Use as directed 15 mLs in the mouth or throat 2 (two) times daily. 240 mL 0  .  Cholecalciferol (VITAMIN D) 125 MCG (5000 UT) CAPS Take 5,000 Units by mouth daily.     . COLLAGEN PO Take 1 tablet by mouth daily. Super Collagen w/Biotin    . ibuprofen (ADVIL) 200 MG tablet Take 600 mg by mouth every 8 (eight) hours as needed (for pain.).     Marland Kitchen lidocaine-prilocaine (EMLA) cream Apply 1 application topically as needed. 30 g 0  . methimazole (TAPAZOLE) 5 MG tablet Take 5 mg by mouth daily.     . Multiple Vitamin (MULTIVITAMIN WITH MINERALS) TABS tablet Take 1 tablet by mouth daily.    . Omega 3-6-9 Fatty Acids (OMEGA 3-6-9 COMPLEX) CAPS Take 1 capsule by mouth daily.    . ondansetron (ZOFRAN) 8 MG tablet One pill every 8 hours as needed for nausea/vomitting. 40 tablet 1  . prochlorperazine (COMPAZINE) 10 MG tablet Take 1 tablet (10 mg total) by mouth every 6 (six) hours as needed for nausea or vomiting. 40 tablet 1  . valACYclovir (VALTREX) 500 MG tablet TAKE ONE TABLET BY MOUTH DAILY 30 tablet 11   No current facility-administered medications for this visit.   Facility-Administered Medications Ordered in Other Visits  Medication Dose Route Frequency Provider Last Rate Last Admin  . heparin lock flush 100 unit/mL  500 Units Intravenous Once Cammie Sickle, MD        PHYSICAL EXAMINATION: ECOG PERFORMANCE STATUS: 0 - Asymptomatic  Vitals:   01/01/20 0919  BP: 114/86  Pulse: 85  Resp: 16  Temp: 98.6 F (37 C)  SpO2: 99%   Filed Weights   01/01/20 0919  Weight: 174 lb (78.9 kg)    Physical Exam HENT:     Head: Normocephalic and atraumatic.     Mouth/Throat:     Pharynx: No oropharyngeal exudate.  Eyes:     Pupils: Pupils are equal, round, and reactive to light.  Cardiovascular:     Rate and Rhythm: Normal rate and regular rhythm.  Pulmonary:     Effort: Pulmonary effort is normal. No respiratory distress.     Breath sounds: Normal breath sounds. No wheezing.  Abdominal:     General: Bowel sounds are normal. There is no distension.     Palpations:  Abdomen is soft. There is no mass.     Tenderness: There is no abdominal tenderness. There is no guarding or rebound.  Musculoskeletal:        General: No tenderness. Normal range of motion.     Cervical back: Normal range of motion and neck supple.  Skin:    General: Skin is warm.  Comments: Rash completely resolved.  Neurological:     Mental Status: She is alert and oriented to person, place, and time.  Psychiatric:        Mood and Affect: Affect normal.    LABORATORY DATA:  I have reviewed the data as listed Lab Results  Component Value Date   WBC 6.1 01/01/2020   HGB 11.7 (L) 01/01/2020   HCT 36.5 01/01/2020   MCV 88.4 01/01/2020   PLT 243 01/01/2020   Recent Labs    11/20/19 0903 12/18/19 0840 01/01/20 0859  NA 139 143 139  K 4.0 4.1 4.1  CL 107 109 104  CO2 '24 24 24  ' GLUCOSE 98 105* 106*  BUN 22* 14 16  CREATININE 0.83 0.72 0.81  CALCIUM 9.6 9.0 9.5  GFRNONAA >60 >60 >60  GFRAA >60 >60 >60  PROT 6.7 6.6 7.1  ALBUMIN 4.1 4.0 4.2  AST 32 36 39  ALT 36 35 39  ALKPHOS 72 76 76  BILITOT 0.8 0.5 0.8    RADIOGRAPHIC STUDIES: I have personally reviewed the radiological images as listed and agreed with the findings in the report. No results found. ASSESSMENT & PLAN:   Carcinoma of upper-outer quadrant of right breast in female, estrogen receptor positive ([place]) #Right breast-invasive mammary carcinoma- S/P LUMPECTOMY pT1a [28m];pN0 [STAGE IA- ER/PRpoS; Her 2 POS]; STAGE I. S/p Taxol-Herceptin; currently on Herceptin maintenance  # Proceed with maintenance herceptin today; Labs today reviewed;  acceptable for treatment today. Ordered MUGA scan.    # Right chest wall rash/Shingles-resolved; continue on Valtrex 500 mg once a day [sec prophy]  # Fatigue-grade 1; secondary chemotherapy- STABLE.   # SWOG study/PN study- PN- G-1 stable;; monitor for now.  # Disposition: # Herceptin today #  follow up in 3 weeks- MD; labs- cbc/cmp; Herceptin; MUGA scan-  Dr.B   All questions were answered. The patient/family knows to call the clinic with any problems, questions or concerns.    GCammie Sickle MD 01/01/2020 10:20 AM

## 2020-01-01 NOTE — Assessment & Plan Note (Addendum)
#  Right breast-invasive mammary carcinoma- S/P LUMPECTOMY pT1a [7mm];pN0 [STAGE IA- ER/PRpoS; Her 2 POS]; STAGE I. S/p Taxol-Herceptin; currently on Herceptin maintenance  # Proceed with maintenance herceptin today; Labs today reviewed;  acceptable for treatment today. Ordered MUGA scan.    # Right chest wall rash/Shingles-resolved; continue on Valtrex 500 mg once a day [sec prophy]  # Fatigue-grade 1; secondary chemotherapy- STABLE.   # SWOG study/PN study- PN- G-1 stable;; monitor for now.  # Disposition: # Herceptin today #  follow up in 3 weeks- MD; labs- cbc/cmp; Herceptin; MUGA scan- Dr.B

## 2020-01-02 ENCOUNTER — Encounter: Payer: Self-pay | Admitting: *Deleted

## 2020-01-02 ENCOUNTER — Ambulatory Visit
Admission: RE | Admit: 2020-01-02 | Discharge: 2020-01-02 | Disposition: A | Discharge status: Home / Self Care | Payer: 59 | Source: Ambulatory Visit | Attending: Radiation Oncology | Admitting: Radiation Oncology

## 2020-01-02 DIAGNOSIS — Z510 Encounter for antineoplastic radiation therapy: Secondary | ICD-10-CM | POA: Diagnosis not present

## 2020-01-02 DIAGNOSIS — C50411 Malignant neoplasm of upper-outer quadrant of right female breast: Secondary | ICD-10-CM | POA: Diagnosis not present

## 2020-01-02 DIAGNOSIS — Z170 Estrogen receptor positive status [ER+]: Secondary | ICD-10-CM | POA: Diagnosis not present

## 2020-01-03 ENCOUNTER — Ambulatory Visit
Admission: RE | Admit: 2020-01-03 | Discharge: 2020-01-03 | Disposition: A | Discharge status: Home / Self Care | Payer: 59 | Source: Ambulatory Visit | Attending: Radiation Oncology | Admitting: Radiation Oncology

## 2020-01-03 DIAGNOSIS — Z510 Encounter for antineoplastic radiation therapy: Secondary | ICD-10-CM | POA: Diagnosis not present

## 2020-01-03 DIAGNOSIS — C50411 Malignant neoplasm of upper-outer quadrant of right female breast: Secondary | ICD-10-CM | POA: Diagnosis not present

## 2020-01-03 DIAGNOSIS — Z170 Estrogen receptor positive status [ER+]: Secondary | ICD-10-CM | POA: Diagnosis not present

## 2020-01-06 ENCOUNTER — Ambulatory Visit
Admission: RE | Admit: 2020-01-06 | Discharge: 2020-01-06 | Disposition: A | Discharge status: Home / Self Care | Payer: 59 | Source: Ambulatory Visit | Attending: Radiation Oncology | Admitting: Radiation Oncology

## 2020-01-06 DIAGNOSIS — Z170 Estrogen receptor positive status [ER+]: Secondary | ICD-10-CM | POA: Diagnosis not present

## 2020-01-06 DIAGNOSIS — C50411 Malignant neoplasm of upper-outer quadrant of right female breast: Secondary | ICD-10-CM | POA: Diagnosis not present

## 2020-01-06 DIAGNOSIS — Z510 Encounter for antineoplastic radiation therapy: Secondary | ICD-10-CM | POA: Diagnosis not present

## 2020-01-07 ENCOUNTER — Ambulatory Visit
Admission: RE | Admit: 2020-01-07 | Discharge: 2020-01-07 | Disposition: A | Discharge status: Home / Self Care | Payer: 59 | Source: Ambulatory Visit | Attending: Radiation Oncology | Admitting: Radiation Oncology

## 2020-01-07 DIAGNOSIS — Z170 Estrogen receptor positive status [ER+]: Secondary | ICD-10-CM | POA: Diagnosis not present

## 2020-01-07 DIAGNOSIS — C50411 Malignant neoplasm of upper-outer quadrant of right female breast: Secondary | ICD-10-CM | POA: Diagnosis not present

## 2020-01-07 DIAGNOSIS — Z510 Encounter for antineoplastic radiation therapy: Secondary | ICD-10-CM | POA: Diagnosis not present

## 2020-01-08 ENCOUNTER — Ambulatory Visit
Admission: RE | Admit: 2020-01-08 | Discharge: 2020-01-08 | Disposition: A | Discharge status: Home / Self Care | Payer: 59 | Source: Ambulatory Visit | Attending: Radiation Oncology | Admitting: Radiation Oncology

## 2020-01-08 DIAGNOSIS — Z510 Encounter for antineoplastic radiation therapy: Secondary | ICD-10-CM | POA: Diagnosis not present

## 2020-01-08 DIAGNOSIS — C50411 Malignant neoplasm of upper-outer quadrant of right female breast: Secondary | ICD-10-CM | POA: Diagnosis not present

## 2020-01-08 DIAGNOSIS — Z170 Estrogen receptor positive status [ER+]: Secondary | ICD-10-CM | POA: Diagnosis not present

## 2020-01-09 ENCOUNTER — Ambulatory Visit
Admission: RE | Admit: 2020-01-09 | Discharge: 2020-01-09 | Disposition: A | Discharge status: Home / Self Care | Payer: 59 | Source: Ambulatory Visit | Attending: Radiation Oncology | Admitting: Radiation Oncology

## 2020-01-09 DIAGNOSIS — Z510 Encounter for antineoplastic radiation therapy: Secondary | ICD-10-CM | POA: Diagnosis not present

## 2020-01-09 DIAGNOSIS — C50411 Malignant neoplasm of upper-outer quadrant of right female breast: Secondary | ICD-10-CM | POA: Diagnosis not present

## 2020-01-09 DIAGNOSIS — Z170 Estrogen receptor positive status [ER+]: Secondary | ICD-10-CM | POA: Diagnosis not present

## 2020-01-10 ENCOUNTER — Ambulatory Visit
Admission: RE | Admit: 2020-01-10 | Discharge: 2020-01-10 | Disposition: A | Discharge status: Home / Self Care | Payer: 59 | Source: Ambulatory Visit | Attending: Radiation Oncology | Admitting: Radiation Oncology

## 2020-01-10 DIAGNOSIS — Z510 Encounter for antineoplastic radiation therapy: Secondary | ICD-10-CM | POA: Diagnosis not present

## 2020-01-10 DIAGNOSIS — Z170 Estrogen receptor positive status [ER+]: Secondary | ICD-10-CM | POA: Diagnosis not present

## 2020-01-10 DIAGNOSIS — C50411 Malignant neoplasm of upper-outer quadrant of right female breast: Secondary | ICD-10-CM | POA: Diagnosis not present

## 2020-01-13 ENCOUNTER — Ambulatory Visit
Admission: RE | Admit: 2020-01-13 | Discharge: 2020-01-13 | Disposition: A | Discharge status: Home / Self Care | Payer: 59 | Source: Ambulatory Visit | Attending: Radiation Oncology | Admitting: Radiation Oncology

## 2020-01-13 DIAGNOSIS — Z510 Encounter for antineoplastic radiation therapy: Secondary | ICD-10-CM | POA: Diagnosis not present

## 2020-01-14 ENCOUNTER — Ambulatory Visit
Admission: RE | Admit: 2020-01-14 | Discharge: 2020-01-14 | Disposition: A | Discharge status: Home / Self Care | Payer: 59 | Source: Ambulatory Visit | Attending: Radiation Oncology | Admitting: Radiation Oncology

## 2020-01-14 DIAGNOSIS — Z510 Encounter for antineoplastic radiation therapy: Secondary | ICD-10-CM | POA: Diagnosis not present

## 2020-01-15 ENCOUNTER — Ambulatory Visit
Admission: RE | Admit: 2020-01-15 | Discharge: 2020-01-15 | Disposition: A | Discharge status: Home / Self Care | Payer: 59 | Source: Ambulatory Visit | Attending: Radiation Oncology | Admitting: Radiation Oncology

## 2020-01-15 DIAGNOSIS — Z510 Encounter for antineoplastic radiation therapy: Secondary | ICD-10-CM | POA: Diagnosis not present

## 2020-01-16 ENCOUNTER — Ambulatory Visit
Admission: RE | Admit: 2020-01-16 | Discharge: 2020-01-16 | Disposition: A | Discharge status: Home / Self Care | Payer: 59 | Source: Ambulatory Visit | Attending: Radiation Oncology | Admitting: Radiation Oncology

## 2020-01-16 DIAGNOSIS — C50411 Malignant neoplasm of upper-outer quadrant of right female breast: Secondary | ICD-10-CM | POA: Diagnosis not present

## 2020-01-16 DIAGNOSIS — Z170 Estrogen receptor positive status [ER+]: Secondary | ICD-10-CM | POA: Diagnosis not present

## 2020-01-16 DIAGNOSIS — Z510 Encounter for antineoplastic radiation therapy: Secondary | ICD-10-CM | POA: Diagnosis not present

## 2020-01-17 ENCOUNTER — Ambulatory Visit
Admission: RE | Admit: 2020-01-17 | Discharge: 2020-01-17 | Disposition: A | Discharge status: Home / Self Care | Payer: 59 | Source: Ambulatory Visit | Attending: Radiation Oncology | Admitting: Radiation Oncology

## 2020-01-17 DIAGNOSIS — Z510 Encounter for antineoplastic radiation therapy: Secondary | ICD-10-CM | POA: Diagnosis not present

## 2020-01-17 DIAGNOSIS — C50411 Malignant neoplasm of upper-outer quadrant of right female breast: Secondary | ICD-10-CM | POA: Diagnosis not present

## 2020-01-17 DIAGNOSIS — Z170 Estrogen receptor positive status [ER+]: Secondary | ICD-10-CM | POA: Diagnosis not present

## 2020-01-17 DIAGNOSIS — Z5112 Encounter for antineoplastic immunotherapy: Secondary | ICD-10-CM | POA: Insufficient documentation

## 2020-01-20 ENCOUNTER — Other Ambulatory Visit: Payer: Self-pay

## 2020-01-20 ENCOUNTER — Ambulatory Visit
Admission: RE | Admit: 2020-01-20 | Discharge: 2020-01-20 | Disposition: A | Discharge status: Home / Self Care | Payer: 59 | Source: Ambulatory Visit | Attending: Internal Medicine | Admitting: Internal Medicine

## 2020-01-20 DIAGNOSIS — Z09 Encounter for follow-up examination after completed treatment for conditions other than malignant neoplasm: Secondary | ICD-10-CM | POA: Diagnosis not present

## 2020-01-20 DIAGNOSIS — Z170 Estrogen receptor positive status [ER+]: Secondary | ICD-10-CM | POA: Diagnosis not present

## 2020-01-20 DIAGNOSIS — C50411 Malignant neoplasm of upper-outer quadrant of right female breast: Secondary | ICD-10-CM | POA: Diagnosis not present

## 2020-01-20 DIAGNOSIS — Z5111 Encounter for antineoplastic chemotherapy: Secondary | ICD-10-CM | POA: Diagnosis not present

## 2020-01-20 DIAGNOSIS — C50911 Malignant neoplasm of unspecified site of right female breast: Secondary | ICD-10-CM | POA: Diagnosis not present

## 2020-01-20 MED ORDER — TECHNETIUM TC 99M-LABELED RED BLOOD CELLS IV KIT
23.4000 | PACK | Freq: Once | INTRAVENOUS | Status: AC | PRN
  Administered 2020-01-20: 23.4 via INTRAVENOUS

## 2020-01-21 ENCOUNTER — Encounter: Payer: Self-pay | Admitting: Internal Medicine

## 2020-01-21 NOTE — Progress Notes (Signed)
Patient called for pre assessment. She denies any pain at this time. She would like to discuss with provider if she is eligible to get shingle and pneumonia vaccine at this time. She would also like to ask if work asked her to come back to office, if you feel her immune system is strong enough. Her last question is dealing with fatty liver diagnosis. Since she completed last radiation treatment last week, she would like to know if this is something you would like to discuss with her.

## 2020-01-22 ENCOUNTER — Other Ambulatory Visit: Payer: Self-pay

## 2020-01-22 ENCOUNTER — Inpatient Hospital Stay (HOSPITAL_BASED_OUTPATIENT_CLINIC_OR_DEPARTMENT_OTHER): Payer: 59 | Admitting: Internal Medicine

## 2020-01-22 ENCOUNTER — Inpatient Hospital Stay: Payer: 59 | Attending: Internal Medicine

## 2020-01-22 ENCOUNTER — Encounter: Payer: Self-pay | Admitting: *Deleted

## 2020-01-22 ENCOUNTER — Inpatient Hospital Stay: Payer: 59

## 2020-01-22 DIAGNOSIS — Z006 Encounter for examination for normal comparison and control in clinical research program: Secondary | ICD-10-CM | POA: Diagnosis not present

## 2020-01-22 DIAGNOSIS — Z87891 Personal history of nicotine dependence: Secondary | ICD-10-CM | POA: Diagnosis not present

## 2020-01-22 DIAGNOSIS — Z5112 Encounter for antineoplastic immunotherapy: Secondary | ICD-10-CM | POA: Diagnosis not present

## 2020-01-22 DIAGNOSIS — Z170 Estrogen receptor positive status [ER+]: Secondary | ICD-10-CM | POA: Insufficient documentation

## 2020-01-22 DIAGNOSIS — C50411 Malignant neoplasm of upper-outer quadrant of right female breast: Secondary | ICD-10-CM | POA: Diagnosis not present

## 2020-01-22 LAB — COMPREHENSIVE METABOLIC PANEL
ALT: 50 U/L — ABNORMAL HIGH (ref 0–44)
AST: 47 U/L — ABNORMAL HIGH (ref 15–41)
Albumin: 4 g/dL (ref 3.5–5.0)
Alkaline Phosphatase: 75 U/L (ref 38–126)
Anion gap: 13 (ref 5–15)
BUN: 16 mg/dL (ref 6–20)
CO2: 22 mmol/L (ref 22–32)
Calcium: 8.9 mg/dL (ref 8.9–10.3)
Chloride: 102 mmol/L (ref 98–111)
Creatinine, Ser: 0.83 mg/dL (ref 0.44–1.00)
GFR calc non Af Amer: >60 mL/min (ref >60)
Glucose, Bld: 215 mg/dL — ABNORMAL HIGH (ref 70–99)
Potassium: 3.9 mmol/L (ref 3.5–5.1)
Sodium: 137 mmol/L (ref 135–145)
Total Bilirubin: 0.6 mg/dL (ref 0.3–1.2)
Total Protein: 6.9 g/dL (ref 6.5–8.1)

## 2020-01-22 LAB — CBC WITH DIFFERENTIAL/PLATELET
Abs Immature Granulocytes: 0.03 10*3/uL (ref 0.00–0.07)
Basophils Absolute: 0.1 10*3/uL (ref 0.0–0.1)
Basophils Relative: 2 %
Eosinophils Absolute: 0.4 10*3/uL (ref 0.0–0.5)
Eosinophils Relative: 7 %
HCT: 36.2 % (ref 36.0–46.0)
Hemoglobin: 11.6 g/dL — ABNORMAL LOW (ref 12.0–15.0)
Immature Granulocytes: 1 %
Lymphocytes Relative: 18 %
Lymphs Abs: 1 10*3/uL (ref 0.7–4.0)
MCH: 28.1 pg (ref 26.0–34.0)
MCHC: 32 g/dL (ref 30.0–36.0)
MCV: 87.7 fL (ref 80.0–100.0)
Monocytes Absolute: 0.5 10*3/uL (ref 0.1–1.0)
Monocytes Relative: 9 %
Neutro Abs: 3.6 10*3/uL (ref 1.7–7.7)
Neutrophils Relative %: 63 %
Platelets: 197 10*3/uL (ref 150–400)
RBC: 4.13 MIL/uL (ref 3.87–5.11)
RDW: 14.6 % (ref 11.5–15.5)
WBC: 5.6 10*3/uL (ref 4.0–10.5)
nRBC: 0 % (ref 0.0–0.2)

## 2020-01-22 MED ORDER — HEPARIN SOD (PORK) LOCK FLUSH 100 UNIT/ML IV SOLN
500.0000 [IU] | Freq: Once | INTRAVENOUS | Status: AC | PRN
  Administered 2020-01-22: 500 [IU]
  Filled 2020-01-22: qty 5

## 2020-01-22 MED ORDER — SODIUM CHLORIDE 0.9% FLUSH
10.0000 mL | Freq: Once | INTRAVENOUS | Status: AC
  Administered 2020-01-22: 10 mL via INTRAVENOUS
  Filled 2020-01-22: qty 10

## 2020-01-22 MED ORDER — DIPHENHYDRAMINE HCL 25 MG PO CAPS
25.0000 mg | ORAL_CAPSULE | Freq: Once | ORAL | Status: AC
  Administered 2020-01-22: 25 mg via ORAL
  Filled 2020-01-22: qty 1

## 2020-01-22 MED ORDER — SODIUM CHLORIDE 0.9 % IV SOLN
Freq: Once | INTRAVENOUS | Status: AC
  Filled 2020-01-22: qty 250

## 2020-01-22 MED ORDER — TRASTUZUMAB-ANNS CHEMO 150 MG IV SOLR
150.0000 mg | Freq: Once | INTRAVENOUS | Status: AC
  Administered 2020-01-22: 150 mg via INTRAVENOUS
  Filled 2020-01-22: qty 7.14

## 2020-01-22 MED ORDER — ACETAMINOPHEN 325 MG PO TABS
650.0000 mg | ORAL_TABLET | Freq: Once | ORAL | Status: AC
  Administered 2020-01-22: 650 mg via ORAL
  Filled 2020-01-22: qty 2

## 2020-01-22 MED ORDER — HEPARIN SOD (PORK) LOCK FLUSH 100 UNIT/ML IV SOLN
INTRAVENOUS | Status: AC
  Filled 2020-01-22: qty 5

## 2020-01-22 NOTE — Progress Notes (Signed)
[facility] [place] NOTE  Patient Care Team: Birdie Sons, MD as PCP - General (Family Medicine) [facility] Gyn (Gynecology) Theodore Demark, RN as Oncology Nurse Navigator Cammie Sickle, MD as Consulting Physician (Hematology and Oncology)  CHIEF COMPLAINTS/PURPOSE OF CONSULTATION: Breast cancer  #  Oncology History Overview Note  # MARCH 2021- RUOQ- 74mm- [BREAST, RIGHT, LATERAL POSTERIOR  - INVASIVE MAMMARY CARCINOMA, NO SPECIAL TYPE, ASSOCIATED WITH  CALCIFICATIONS.[place]; ER-> 90%; PR- 50-90%; HER 2 FISH- POSITIVE  # S/P LUMPECTOMY [Dr.Cintron] pT1a [56mm];pN0 [STAGE IA- ER/PRpoS; Her 2 POS]  . BREAST, RIGHT, LATERAL MIDDLE DEPTH (RIBBON-SHAPED CLIP):  STEREOTACTIC-GUIDED CORE BIOPSY: - DUCTAL CARCINOMA IN SITU (DCIS), HIGH-GRADE, ASSOCIATED WITH  CALCIFICATIONS.   # May 14th,2021- TH-H  #Hypothyroidism-methimazole [Dr.Solum]  # SURVIVORSHIP:   # GENETICS:   DIAGNOSIS: Breast cancer  STAGE:   1      ;  GOALS: Cure  CURRENT/MOST RECENT THERAPY : Taxol Herceptin   Carcinoma of upper-outer quadrant of right breast in female, estrogen receptor positive ([place])  07/15/2019 Initial Diagnosis   Carcinoma of upper-outer quadrant of right breast in female, estrogen receptor positive ([place])   08/28/2019 -  Chemotherapy   The patient had PACLitaxel (TAXOL) 156 mg in sodium chloride 0.9 % 250 mL chemo infusion (</= $RemoveBefor'80mg'YvnEORTvLkUh$ /m2), 80 mg/m2 = 156 mg, Intravenous,  Once, 3 of 3 cycles Administration: 156 mg (08/28/2019), 156 mg (09/04/2019), 156 mg (09/25/2019), 156 mg (09/11/2019), 156 mg (09/18/2019), 156 mg (10/02/2019), 156 mg (10/09/2019), 156 mg (10/16/2019), 156 mg (10/23/2019), 156 mg (10/30/2019), 156 mg (11/06/2019), 156 mg (11/14/2019) trastuzumab-anns (KANJINTI) 336 mg in sodium chloride 0.9 % 250 mL chemo infusion, 4 mg/kg = 336 mg (100 % of original dose 4 mg/kg), Intravenous,  Once, 6 of 16 cycles Dose modification: 4 mg/kg (original dose 4 mg/kg, Cycle 1, Reason: Other  (see comments), Comment: loading dose, biosimilar per insurance) Administration: 336 mg (08/28/2019), 150 mg (09/04/2019), 150 mg (09/25/2019), 150 mg (11/20/2019), 150 mg (01/01/2020), 150 mg (09/11/2019), 150 mg (09/18/2019), 150 mg (10/02/2019), 150 mg (10/09/2019), 150 mg (10/16/2019), 150 mg (10/23/2019), 150 mg (10/30/2019), 150 mg (11/06/2019), 150 mg (11/14/2019), 150 mg (01/22/2020)  for chemotherapy treatment.      HISTORY OF PRESENTING ILLNESS:  Connie Bradford 59 y.o.  female with breast cancer early stage-ER/PR positive HER-2/neu positive on adjuvant  Herceptin maintenance is here for follow-up/review results of MUGA scan.  Patient is placing up radiation this week.  Complains of mild rash in the radiation portal.  Patient denies any worsening shingles.  Patient is currently on prophylactic dose of valacyclovir 500 mg once a day.  She is interested in shingles vaccination.  Denies any headaches.  Any nausea vomiting.  No diarrhea.  Mild intermittent tingling and numbness in extremities.  Review of Systems  Constitutional: Positive for malaise/fatigue. Negative for chills, diaphoresis, fever and weight loss.  HENT: Negative for nosebleeds and sore throat.   Eyes: Negative for double vision.  Respiratory: Negative for cough, hemoptysis, sputum production, shortness of breath and wheezing.   Cardiovascular: Negative for chest pain, palpitations, orthopnea and leg swelling.  Gastrointestinal: Negative for abdominal pain, blood in stool, constipation, diarrhea, heartburn, melena, nausea and vomiting.  Genitourinary: Negative for dysuria, frequency and urgency.  Musculoskeletal: Negative for back pain and joint pain.  Skin: Negative for itching.  Neurological: Positive for tingling. Negative for dizziness, focal weakness, weakness and headaches.  Endo/Heme/Allergies: Does not bruise/bleed easily.  Psychiatric/Behavioral: Negative for depression. The patient is  not nervous/anxious and does not have  insomnia.      MEDICAL HISTORY:  Past Medical History:  Diagnosis Date  . Atypical mole 09/26/2018   right mid anterior side/mild  . Basal cell carcinoma 08/25/2014   right med cheek infraorbital  . Breast cancer ([place]) 07/2019   [place] and DCIS  . Dyslipidemia   . Family history of bladder cancer   . Graves disease   . Hyperthyroidism   . Inborn lipid storage disorder   . Leiomyoma of uterus   . Vitamin D deficiency     SURGICAL HISTORY: Past Surgical History:  Procedure Laterality Date  . APPENDECTOMY  1982   Cyst removed from ovaries   . BREAST BIOPSY Right 07/05/2019   stereo bx, coil clip, I[facility]  . BREAST BIOPSY Right 07/05/2019   stereo bx, ribbon clip, DCIS   . BREAST LUMPECTOMY Right 07/31/2019    [facility] and DCIS with SN bx  . COLONOSCOPY WITH PROPOFOL N/A 01/20/2017   Procedure: COLONOSCOPY WITH PROPOFOL;  Surgeon: Jonathon Bellows, MD;  Location: [facility] ENDOSCOPY;  Service: Gastroenterology;  Laterality: N/A;  . [place]  . NECK SURGERY  2013   LIft from weight loss  . PARTIAL MASTECTOMY WITH NEEDLE LOCALIZATION AND AXILLARY SENTINEL LYMPH NODE BX Right 07/31/2019   Procedure: RIGHT PARTIAL MASTECTOMY WITH BRACKETED NEEDLE LOCALIZATION AND LEFT AXILLARY SENTINEL LYMPH NODE BIOPSY;  Surgeon: Herbert Pun, MD;  Location: [facility] ORS;  Service: General;  Laterality: Right;  . PORTACATH PLACEMENT Left 07/31/2019   Procedure: INSERTION PORT-A-CATH LEFT INTERNAL JUGULAR;  Surgeon: Herbert Pun, MD;  Location: [facility] ORS;  Service: General;  Laterality: Left;  . TONSILLECTOMY  1969    SOCIAL HISTORY: Social History   Socioeconomic History  . Marital status: Married    Spouse name: Not on file  . Number of children: 0  . Years of education: Anselm Lis  . Highest education level: Not on file  Occupational History  . Occupation: Full-Time    Comment: [employer] x9  Tobacco Use  . Smoking status: Former Research scientist (life sciences)  . Smokeless  tobacco: Never Used  Vaping Use  . Vaping Use: Never used  Substance and Sexual Activity  . Alcohol use: Yes    Alcohol/week: 0.0 standard drinks    Comment: RARELY  . Drug use: No  . Sexual activity: Yes    Birth control/protection: Post-menopausal  Other Topics Concern  . Not on file  Social History Narrative   Lives close to [place]; with husband. Quit smoking in 2007 [25 years]; ocassional alcohol. 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    Social Determinants of Health   Financial Resource Strain:   . Difficulty of Paying Living Expenses: Not on file  Food Insecurity:   . Worried About Charity fundraiser in the Last Year: Not on file  . Ran Out of Food in the Last Year: Not on file  Transportation Needs:   . Lack of Transportation (Medical): Not on file  . Lack of Transportation (Non-Medical): Not on file  Physical Activity:   . Days of Exercise per Week: Not on file  . Minutes of Exercise per Session: Not on file  Stress:   . Feeling of Stress : Not on file  Social Connections:   . Frequency of Communication with Friends and Family: Not on file  . Frequency of Social Gatherings with Friends and Family: Not on file  . Attends Religious Services: Not on file  . Active  Member of Clubs or Organizations: Not on file  . Attends Banker Meetings: Not on file  . Marital Status: Not on file  Intimate Partner Violence:   . Fear of Current or Ex-Partner: Not on file  . Emotionally Abused: Not on file  . Physically Abused: Not on file  . Sexually Abused: Not on file    FAMILY HISTORY: Family History  Problem Relation Age of Onset  . Bladder Cancer Mother   . Diabetes Father   . Heart disease Father   . Bladder Cancer Brother   . Breast cancer Neg Hx     ALLERGIES:  is allergic to penicillins.  MEDICATIONS:  Current Outpatient Medications  Medication Sig Dispense Refill  . B COMPLEX VITAMINS PO Take 1 tablet by mouth daily.     . chlorhexidine (PERIDEX) 0.12 %  solution Use as directed 15 mLs in the mouth or throat 2 (two) times daily. 240 mL 0  . Cholecalciferol (VITAMIN D) 125 MCG (5000 UT) CAPS Take 5,000 Units by mouth daily.     . COLLAGEN PO Take 1 tablet by mouth daily. Super Collagen w/Biotin    . ibuprofen (ADVIL) 200 MG tablet Take 600 mg by mouth every 8 (eight) hours as needed (for pain.).     Marland Kitchen lidocaine-prilocaine (EMLA) cream Apply 1 application topically as needed. 30 g 0  . methimazole (TAPAZOLE) 5 MG tablet Take 5 mg by mouth daily.     . Multiple Vitamin (MULTIVITAMIN WITH MINERALS) TABS tablet Take 1 tablet by mouth daily.    . Omega 3-6-9 Fatty Acids (OMEGA 3-6-9 COMPLEX) CAPS Take 1 capsule by mouth daily.    . ondansetron (ZOFRAN) 8 MG tablet One pill every 8 hours as needed for nausea/vomitting. 40 tablet 1  . prochlorperazine (COMPAZINE) 10 MG tablet Take 1 tablet (10 mg total) by mouth every 6 (six) hours as needed for nausea or vomiting. 40 tablet 1  . valACYclovir (VALTREX) 500 MG tablet TAKE ONE TABLET BY MOUTH DAILY 30 tablet 11   No current facility-administered medications for this visit.    PHYSICAL EXAMINATION: ECOG PERFORMANCE STATUS: 0 - Asymptomatic  Vitals:   01/22/20 0917  BP: 131/71  Pulse: 76  Resp: 16  Temp: 97.6 F (36.4 C)  SpO2: 100%   Filed Weights   01/22/20 0917  Weight: 181 lb (82.1 kg)    Physical Exam HENT:     Head: Normocephalic and atraumatic.     Mouth/Throat:     Pharynx: No oropharyngeal exudate.  Eyes:     Pupils: Pupils are equal, round, and reactive to light.  Cardiovascular:     Rate and Rhythm: Normal rate and regular rhythm.  Pulmonary:     Effort: Pulmonary effort is normal. No respiratory distress.     Breath sounds: Normal breath sounds. No wheezing.  Abdominal:     General: Bowel sounds are normal. There is no distension.     Palpations: Abdomen is soft. There is no mass.     Tenderness: There is no abdominal tenderness. There is no guarding or rebound.   Musculoskeletal:        General: No tenderness. Normal range of motion.     Cervical back: Normal range of motion and neck supple.  Skin:    General: Skin is warm.     Comments: Erythema/warmth noted in the radiation portal.  No desquamation.  Neurological:     Mental Status: She is alert and oriented to person, place,  and time.  Psychiatric:        Mood and Affect: Affect normal.    LABORATORY DATA:  I have reviewed the data as listed Lab Results  Component Value Date   WBC 5.6 01/22/2020   HGB 11.6 (L) 01/22/2020   HCT 36.2 01/22/2020   MCV 87.7 01/22/2020   PLT 197 01/22/2020   Recent Labs    11/20/19 0903 11/20/19 0903 12/18/19 0840 01/01/20 0859 01/22/20 0905  NA 139   < > 143 139 137  K 4.0   < > 4.1 4.1 3.9  CL 107   < > 109 104 102  CO2 24   < > _0 GLUCOSE 98   < > 105* 106* 215*  BUN 22*   < > _1 CREATININE 0.83   < > 0.72 0.81 0.83  CALCIUM 9.6   < > 9.0 9.5 8.9  GFRNONAA >60   < > >60 >60 >60  GFRAA >60  --  >60 >60  --   PROT 6.7   < > 6.6 7.1 6.9  ALBUMIN 4.1   < > 4.0 4.2 4.0  AST 32   < > 36 39 47*  ALT 36   < > 35 39 50*  ALKPHOS 72   < > 76 76 75  BILITOT 0.8   < > 0.5 0.8 0.6   < > = values in this interval not displayed.    RADIOGRAPHIC STUDIES: I have personally reviewed the radiological images as listed and agreed with the findings in the report. NM Cardiac Muga Rest  Result Date: 01/20/2020 CLINICAL DATA:  RIGHT breast cancer, on cardiotoxic chemotherapy, assess LV function EXAM: NUCLEAR MEDICINE CARDIAC BLOOD POOL IMAGING (MUGA) TECHNIQUE: Cardiac multi-gated acquisition was performed at rest following intravenous injection of Tc-70mlabeled red blood cells. RADIOPHARMACEUTICALS:  23.4 mCi Tc-975mertechnetate in-vitro labeled red blood cells IV COMPARISON:  08/20/2019 FINDINGS: Calculated LEFT ventricular ejection fraction is 67.4%, normal, slightly increased from the 62.4% on the previous exam. Study was obtained at a  cardiac rate of 68 bpm. Slightly a rhythmic Cine analysis of the LEFT ventricle in 3 projections demonstrates normal LV wall motion. IMPRESSION: Normal LEFT ventricular ejection fraction of 65 7.4% slightly increased from the previous exam. Normal LV wall motion. Electronically Signed   By: MaLavonia Dana.D.   On: 01/20/2020 16:49   ASSESSMENT & PLAN:   Carcinoma of upper-outer quadrant of right breast in female, estrogen receptor positive ([place]#Right breast-invasive mammary carcinoma- S/P LUMPECTOMY pT1a [63m5mpN0 [STAGE IA- ER/PRpoS; Her 2 POS]; STAGE I. S/p Taxol-Herceptin; currently on Herceptin maintenance  # Proceed with maintenance herceptin today; Labs today reviewed;  acceptable for treatment today. 10/04- MUGA scan- 64; normal. .    # Right chest wall rash/Shingles-resolved; continue on Valtrex 500 mg once a day [sec prophy]; ok with shingles vaccine.   # Fatigue-grade 1; secondary chemotherapy- STABLE.   # SWOG study/PN study- PN- G-1 STABLE; monitor for now.  # Fatty liver- on [place]/ALT -grade 1 slightly elevated.  Monitor for now.  # Radiation dermatitis- G-1 cont Aquaphor.   # Disposition: vacation-  # Herceptin today #  follow up on NOv 1st - MD; labs- cbc/cmp; Herceptin-  Dr.B   All questions were answered. The patient/family knows to call the clinic with any problems, questions or concerns.    GovCammie SickleD 01/22/2020 4:20 PM

## 2020-01-22 NOTE — Assessment & Plan Note (Addendum)
#  Right breast-invasive mammary carcinoma- S/P LUMPECTOMY pT1a [27mm];pN0 [STAGE IA- ER/PRpoS; Her 2 POS]; STAGE I. S/p Taxol-Herceptin; currently on Herceptin maintenance  # Proceed with maintenance herceptin today; Labs today reviewed;  acceptable for treatment today. 10/04- MUGA scan- 64; normal. .    # Right chest wall rash/Shingles-resolved; continue on Valtrex 500 mg once a day [sec prophy]; ok with shingles vaccine.   # Fatigue-grade 1; secondary chemotherapy- STABLE.   # SWOG study/PN study- PN- G-1 STABLE; monitor for now.  # Fatty liver- on [place]- AST/ALT -grade 1 slightly elevated.  Monitor for now.  # Radiation dermatitis- G-1 cont Aquaphor.   # Disposition: vacation-  # Herceptin today #  follow up on NOv 1st - MD; labs- cbc/cmp; Herceptin-  Dr.B

## 2020-01-22 NOTE — Research (Signed)
Patient Connie Bradford presents to clinic this morning for her scheduled infusion of Herceptin alone. She completed her Taxol infusions on 11/14/2019. Solicited Neuropathy events were assessed and patient reports experiencing intermittent numbness and tingling in her fingers & hands as well as her toes and feet that is much less noticeable than before and no longer painful. She denies experiencing any difficulty performing ADLs or using fine motor skills such as writing and buttoning her blouse. She still denies experiencing any problems with her balance and ambulation, and denies experiencing any falls in the past 6 months. Patient continues to report some mild fatigue that is ongoing. Questioned patient about any ancillary therapy she has used for her neuropathy and she denies having used acupuncture, cooling or compression gloves/socks, and states she only received the one massage reported last visit, which did not improve her neuropathy symptoms at all. [name] completed the week 24 study questionnaires including: PROMIS 29, GSLPTAQ, EORTC QLQ-CIPN20, PRO-CTCAE, Follow up Patient Symptom burden questionnaire and FACT/GOG-NTX. Dr. Rogue Bussing performed H&P and completed the Follow up Physician Toxicity Burden assessment and reviewed the Neuropathy AE assessment signing in agreement with the changes. Patient confirms she is right hand and right foot dominant. Neuropen and Tuning fork assessments were also completed by this RN without assistance. Patient reports having much improved sensation with the neuropen and tuning fork assessments. Informed patient that her next study assessment will occur around the one year time point since she began receiving Taxol infusions. Solicited and other adverse events with grade and attribution as note below:   Solicited Adverse Event Log  Study/Protocol: SWOG K2706 Cycle: Week 24 Event Grade Onset Date Resolved Date Drug Name Attribution Treatment Comments  Dysesthesia 0         Neuralgia 1 09/26/2019 12/25/2019 Taxol definitely None yet   Paresthesia 0        Peripheral Motor Neuropathy 0        Peripheral Sensory Neuropathy 1 2 12/25/2019 11/13/2019  12/25/2019 Taxol definitely None    Fatigue (not a solicited AE) 1 05/21/7626  Herceptin definitely exercise   Alopecia (not solicited) 2 06/17/5174 16/10/3708 Taxol definitely    [name], BSN, MHA, OCN 01/22/2020 10:55 AM

## 2020-02-12 ENCOUNTER — Other Ambulatory Visit: Payer: 59

## 2020-02-12 ENCOUNTER — Other Ambulatory Visit: Payer: Self-pay | Admitting: Internal Medicine

## 2020-02-12 ENCOUNTER — Ambulatory Visit: Payer: 59 | Admitting: Internal Medicine

## 2020-02-12 ENCOUNTER — Ambulatory Visit: Payer: 59

## 2020-02-13 NOTE — Progress Notes (Signed)
Patient was on 2mg /kg given q3w.  Contacted MD to change to 6mg /kg dose.  Also patient will need reload of 8mg /kg for 02/19/20 since has missed >1week between maintenance dose.

## 2020-02-17 ENCOUNTER — Ambulatory Visit: Payer: 59 | Admitting: Internal Medicine

## 2020-02-17 ENCOUNTER — Other Ambulatory Visit: Payer: 59

## 2020-02-17 ENCOUNTER — Ambulatory Visit: Payer: 59

## 2020-02-19 ENCOUNTER — Inpatient Hospital Stay (HOSPITAL_BASED_OUTPATIENT_CLINIC_OR_DEPARTMENT_OTHER): Payer: 59 | Admitting: Internal Medicine

## 2020-02-19 ENCOUNTER — Other Ambulatory Visit: Payer: Self-pay

## 2020-02-19 ENCOUNTER — Inpatient Hospital Stay: Payer: 59

## 2020-02-19 ENCOUNTER — Encounter: Payer: Self-pay | Admitting: Internal Medicine

## 2020-02-19 ENCOUNTER — Inpatient Hospital Stay: Payer: 59 | Attending: Internal Medicine

## 2020-02-19 DIAGNOSIS — C50411 Malignant neoplasm of upper-outer quadrant of right female breast: Secondary | ICD-10-CM | POA: Diagnosis not present

## 2020-02-19 DIAGNOSIS — Z5112 Encounter for antineoplastic immunotherapy: Secondary | ICD-10-CM | POA: Diagnosis not present

## 2020-02-19 DIAGNOSIS — Z170 Estrogen receptor positive status [ER+]: Secondary | ICD-10-CM

## 2020-02-19 LAB — CBC WITH DIFFERENTIAL/PLATELET
Abs Immature Granulocytes: 0.03 10*3/uL (ref 0.00–0.07)
Basophils Absolute: 0.1 10*3/uL (ref 0.0–0.1)
Basophils Relative: 1 %
Eosinophils Absolute: 0.2 10*3/uL (ref 0.0–0.5)
Eosinophils Relative: 4 %
HCT: 37 % (ref 36.0–46.0)
Hemoglobin: 11.8 g/dL — ABNORMAL LOW (ref 12.0–15.0)
Immature Granulocytes: 1 %
Lymphocytes Relative: 21 %
Lymphs Abs: 1 10*3/uL (ref 0.7–4.0)
MCH: 27.7 pg (ref 26.0–34.0)
MCHC: 31.9 g/dL (ref 30.0–36.0)
MCV: 86.9 fL (ref 80.0–100.0)
Monocytes Absolute: 0.4 10*3/uL (ref 0.1–1.0)
Monocytes Relative: 10 %
Neutro Abs: 3 10*3/uL (ref 1.7–7.7)
Neutrophils Relative %: 63 %
Platelets: 203 10*3/uL (ref 150–400)
RBC: 4.26 MIL/uL (ref 3.87–5.11)
RDW: 14.6 % (ref 11.5–15.5)
WBC: 4.6 10*3/uL (ref 4.0–10.5)
nRBC: 0 % (ref 0.0–0.2)

## 2020-02-19 LAB — COMPREHENSIVE METABOLIC PANEL
ALT: 46 U/L — ABNORMAL HIGH (ref 0–44)
AST: 43 U/L — ABNORMAL HIGH (ref 15–41)
Albumin: 3.8 g/dL (ref 3.5–5.0)
Alkaline Phosphatase: 82 U/L (ref 38–126)
Anion gap: 10 (ref 5–15)
BUN: 14 mg/dL (ref 6–20)
CO2: 24 mmol/L (ref 22–32)
Calcium: 9.1 mg/dL (ref 8.9–10.3)
Chloride: 106 mmol/L (ref 98–111)
Creatinine, Ser: 0.78 mg/dL (ref 0.44–1.00)
GFR, Estimated: >60 mL/min (ref >60)
Glucose, Bld: 203 mg/dL — ABNORMAL HIGH (ref 70–99)
Potassium: 3.9 mmol/L (ref 3.5–5.1)
Sodium: 140 mmol/L (ref 135–145)
Total Bilirubin: 0.5 mg/dL (ref 0.3–1.2)
Total Protein: 7 g/dL (ref 6.5–8.1)

## 2020-02-19 MED ORDER — HEPARIN SOD (PORK) LOCK FLUSH 100 UNIT/ML IV SOLN
INTRAVENOUS | Status: AC
  Filled 2020-02-19: qty 5

## 2020-02-19 MED ORDER — ACETAMINOPHEN 325 MG PO TABS
650.0000 mg | ORAL_TABLET | Freq: Once | ORAL | Status: AC
  Administered 2020-02-19: 650 mg via ORAL
  Filled 2020-02-19: qty 2

## 2020-02-19 MED ORDER — SODIUM CHLORIDE 0.9 % IV SOLN
Freq: Once | INTRAVENOUS | Status: AC
  Filled 2020-02-19: qty 250

## 2020-02-19 MED ORDER — TRASTUZUMAB-ANNS CHEMO 150 MG IV SOLR
8.0000 mg/kg | Freq: Once | INTRAVENOUS | Status: AC
  Administered 2020-02-19: 651 mg via INTRAVENOUS
  Filled 2020-02-19: qty 31

## 2020-02-19 MED ORDER — HEPARIN SOD (PORK) LOCK FLUSH 100 UNIT/ML IV SOLN
500.0000 [IU] | Freq: Once | INTRAVENOUS | Status: AC | PRN
  Administered 2020-02-19: 500 [IU]
  Filled 2020-02-19: qty 5

## 2020-02-19 MED ORDER — DIPHENHYDRAMINE HCL 25 MG PO CAPS
25.0000 mg | ORAL_CAPSULE | Freq: Once | ORAL | Status: AC
  Administered 2020-02-19: 25 mg via ORAL
  Filled 2020-02-19: qty 1

## 2020-02-19 NOTE — Progress Notes (Signed)
Connie Bradford tolerated her treatment today without any complications.

## 2020-02-19 NOTE — Assessment & Plan Note (Addendum)
#  Right breast-invasive mammary carcinoma- S/P LUMPECTOMY pT1a [7mm];pN0 [STAGE IA- ER/PRpoS; Her 2 POS]; STAGE I. S/p Taxol-Herceptin; currently on Herceptin maintenance  # Proceed with maintenance herceptin today; Labs today reviewed;  acceptable for treatment today. 10/04- MUGA scan- 64; normal. Discuss AI.   # Right chest wall rash/Shingles-resolved; continue on Valtrex 500 mg once a day [sec prophy]; STABLE.   # SWOG study/PN study- PN- G-1 STABLE; monitor for now.  # Fatty liver- on [place]- AST/ALT -grade 1 slightly elevated; STABLE.  Monitor for now.  # Radiation dermatitis- G-1; STABLE.  cont Aquaphor.   # Disposition:  # Herceptin today #  follow up on Nov 22nd [Tuesday] - MD; labs- cbc/cmp; Herceptin-  Dr.B

## 2020-02-21 ENCOUNTER — Ambulatory Visit: Payer: 59 | Admitting: Radiation Oncology

## 2020-02-24 ENCOUNTER — Ambulatory Visit
Admission: RE | Admit: 2020-02-24 | Discharge: 2020-02-24 | Disposition: A | Discharge status: Home / Self Care | Payer: 59 | Source: Ambulatory Visit | Attending: Radiation Oncology | Admitting: Radiation Oncology

## 2020-02-24 ENCOUNTER — Encounter: Payer: Self-pay | Admitting: Radiation Oncology

## 2020-02-24 VITALS — Temp 96.0°F | Wt 179.0 lb

## 2020-02-24 DIAGNOSIS — Z170 Estrogen receptor positive status [ER+]: Secondary | ICD-10-CM | POA: Insufficient documentation

## 2020-02-24 DIAGNOSIS — C50411 Malignant neoplasm of upper-outer quadrant of right female breast: Secondary | ICD-10-CM | POA: Insufficient documentation

## 2020-02-24 DIAGNOSIS — Z923 Personal history of irradiation: Secondary | ICD-10-CM | POA: Insufficient documentation

## 2020-02-24 DIAGNOSIS — L819 Disorder of pigmentation, unspecified: Secondary | ICD-10-CM | POA: Insufficient documentation

## 2020-02-24 NOTE — Progress Notes (Signed)
Radiation Oncology Follow up Note  Name: Connie Bradford   Date:   02/24/2020 MRN:  [id number] DOB: 1961-04-08    This 59 y.o. female presents to the clinic today for 1 month follow-up status post whole breast radiation to her right breast for stage Ia triple positive invasive mammary carcinoma status post wide local excision and adjuvant chemotherapy.  REFERRING PROVIDER: Birdie Sons, MD  HPI: Patient is a 59 year old female now out 1 month having completed whole breast radiation to her right breast.  For a stage Ia (T1 a N0 M0) ER/PR positive HER-2/neu overexpressed invasive mammary carcinoma status post wide local excision and adjuvant chemotherapy.  Seen today in routine follow-up she is doing well still has some hyperpigmentation of the skin and still some slight dry desquamation of skin.  She continues on Herceptin therapy at this time.  COMPLICATIONS OF TREATMENT: none  FOLLOW UP COMPLIANCE: keeps appointments   PHYSICAL EXAM:  Temp (!) 96 F (35.6 C) (Tympanic)   Wt 179 lb (81.2 kg)   LMP 12/18/2013   BMI 28.04 kg/m  Lungs are clear to A&P cardiac examination essentially unremarkable with regular rate and rhythm. No dominant mass or nodularity is noted in either breast in 2 positions examined. Incision is well-healed. No axillary or supraclavicular adenopathy is appreciated. Cosmetic result is excellent.  Still some slight hyperpigmentation of the skin although cosmetic result is still good to excellent.  Well-developed well-nourished patient in NAD. HEENT reveals PERLA, EOMI, discs not visualized.  Oral cavity is clear. No oral mucosal lesions are identified. Neck is clear without evidence of cervical or supraclavicular adenopathy. Lungs are clear to A&P. Cardiac examination is essentially unremarkable with regular rate and rhythm without murmur rub or thrill. Abdomen is benign with no organomegaly or masses noted. Motor sensory and DTR levels are equal and symmetric in the upper  and lower extremities. Cranial nerves II through XII are grossly intact. Proprioception is intact. No peripheral adenopathy or edema is identified. No motor or sensory levels are noted. Crude visual fields are within normal range.    RADIOLOGY RESULTS: No current films  PLAN: Present time patient is doing well of assured her the hyperpigmentation of skin will clear over the next month or so.  She continues on Herceptin therapy which she is tolerating well.  I have asked to see her back in approximately 5 months for follow-up.  Patient knows to call with any concerns.  I would like to take this opportunity to thank you for allowing me to participate in the care of your patient.Noreene Filbert, MD

## 2020-02-24 NOTE — Progress Notes (Signed)
one [place] NOTE  Patient Care Team: Birdie Sons, MD as PCP - General (Family Medicine) [facility] Gyn (Gynecology) Theodore Demark, RN as Oncology Nurse Navigator Cammie Sickle, MD as Consulting Physician (Hematology and Oncology)  CHIEF COMPLAINTS/PURPOSE OF CONSULTATION: Breast cancer  #  Oncology History Overview Note  # MARCH 2021- RUOQ- 40m- [BREAST, RIGHT, LATERAL POSTERIOR  - INVASIVE MAMMARY CARCINOMA, NO SPECIAL TYPE, ASSOCIATED WITH  CALCIFICATIONS.][place] ER-> 90%; PR- 50-90%; HER 2 FISH- POSITIVE  # S/P LUMPECTOMY [Dr.Cintron] pT1a [468m;pN0 [STAGE IA- ER/PRpoS; Her 2 POS]  . BREAST, RIGHT, LATERAL MIDDLE DEPTH (RIBBON-SHAPED CLIP):  STEREOTACTIC-GUIDED CORE BIOPSY: - DUCTAL CARCINOMA IN SITU (DCIS), HIGH-GRADE, ASSOCIATED WITH  CALCIFICATIONS.   # May 14th,2021- TH-H  #Hypothyroidism-methimazole [Dr.Solum]  # SURVIVORSHIP:   # GENETICS:   DIAGNOSIS: Breast cancer  STAGE:   1      ;  GOALS: Cure  CURRENT/MOST RECENT THERAPY : Taxol Herceptin   Carcinoma of upper-outer quadrant of right breast in female, estrogen receptor positive ([place] 07/15/2019 Initial Diagnosis   Carcinoma of upper-outer quadrant of right breast in female, estrogen receptor positive ([place]  08/28/2019 -  Chemotherapy   The patient had PACLitaxel (TAXOL) 156 mg in sodium chloride 0.9 % 250 mL chemo infusion (</= 8039m2), 80 mg/m2 = 156 mg, Intravenous,  Once, 3 of 3 cycles Administration: 156 mg (08/28/2019), 156 mg (09/04/2019), 156 mg (09/25/2019), 156 mg (09/11/2019), 156 mg (09/18/2019), 156 mg (10/02/2019), 156 mg (10/09/2019), 156 mg (10/16/2019), 156 mg (10/23/2019), 156 mg (10/30/2019), 156 mg (11/06/2019), 156 mg (11/14/2019) trastuzumab-anns (KANJINTI) 336 mg in sodium chloride 0.9 % 250 mL chemo infusion, 4 mg/kg = 336 mg (100 % of original dose 4 mg/kg), Intravenous,  Once, 7 of 16 cycles Dose modification: 4 mg/kg (original dose 4 mg/kg, Cycle 1, Reason: Other  (see comments), Comment: loading dose, biosimilar per insurance), 8 mg/kg (original dose 2 mg/kg, Cycle 7, Reason: Other (see comments), Comment: reload (>1week )), 6 mg/kg (original dose 2 mg/kg, Cycle 8, Reason: Provider Judgment, Comment: for every 3 week dosing) Administration: 336 mg (08/28/2019), 150 mg (09/04/2019), 150 mg (09/25/2019), 150 mg (11/20/2019), 150 mg (01/01/2020), 150 mg (09/11/2019), 150 mg (09/18/2019), 150 mg (10/02/2019), 150 mg (10/09/2019), 150 mg (10/16/2019), 150 mg (10/23/2019), 150 mg (10/30/2019), 150 mg (11/06/2019), 150 mg (11/14/2019), 150 mg (01/22/2020), 651 mg (02/19/2020)  for chemotherapy treatment.      HISTORY OF PRESENTING ILLNESS:  AngBreeanneSmall [age]o.  female with breast cancer early stage-ER/PR positive HER-2/neu positive on adjuvant  Herceptin maintenance is here for follow-up.  Patient denies any nausea vomiting headache.  Denies any diarrhea.  Mild tingling numbness in extremities.  Review of Systems  Constitutional: Positive for malaise/fatigue. Negative for chills, diaphoresis, fever and weight loss.  HENT: Negative for nosebleeds and sore throat.   Eyes: Negative for double vision.  Respiratory: Negative for cough, hemoptysis, sputum production, shortness of breath and wheezing.   Cardiovascular: Negative for chest pain, palpitations, orthopnea and leg swelling.  Gastrointestinal: Negative for abdominal pain, blood in stool, constipation, diarrhea, heartburn, melena, nausea and vomiting.  Genitourinary: Negative for dysuria, frequency and urgency.  Musculoskeletal: Negative for back pain and joint pain.  Skin: Negative for itching.  Neurological: Positive for tingling. Negative for dizziness, focal weakness, weakness and headaches.  Endo/Heme/Allergies: Does not bruise/bleed easily.  Psychiatric/Behavioral: Negative for depression. The patient is not nervous/anxious and does not have insomnia.      MEDICAL HISTORY:  Past Medical History:  Diagnosis Date   . Atypical mole 09/26/2018   right mid anterior side/mild  . Basal cell carcinoma 08/25/2014   right med cheek infraorbital  . Breast cancer ([place]) 07/2019   [place] and DCIS  . Dyslipidemia   . Family history of bladder cancer   . Graves disease   . Hyperthyroidism   . Inborn lipid storage disorder   . Leiomyoma of uterus   . Vitamin D deficiency     SURGICAL HISTORY: Past Surgical History:  Procedure Laterality Date  . APPENDECTOMY  1982   Cyst removed from ovaries   . BREAST BIOPSY Right 07/05/2019   stereo bx, coil clip, IMC  . BREAST BIOPSY Right 07/05/2019   stereo bx, ribbon clip, DCIS   . BREAST LUMPECTOMY Right 07/31/2019    [facility] and DCIS with SN bx  . COLONOSCOPY WITH PROPOFOL N/A 01/20/2017   Procedure: COLONOSCOPY WITH PROPOFOL;  Surgeon: Jonathon Bellows, MD;  Location: [facility] ENDOSCOPY;  Service: Gastroenterology;  Laterality: N/A;  . [place]  . NECK SURGERY  2013   LIft from weight loss  . PARTIAL MASTECTOMY WITH NEEDLE LOCALIZATION AND AXILLARY SENTINEL LYMPH NODE BX Right 07/31/2019   Procedure: RIGHT PARTIAL MASTECTOMY WITH BRACKETED NEEDLE LOCALIZATION AND LEFT AXILLARY SENTINEL LYMPH NODE BIOPSY;  Surgeon: Herbert Pun, MD;  Location: [facility] ORS;  Service: General;  Laterality: Right;  . PORTACATH PLACEMENT Left 07/31/2019   Procedure: INSERTION PORT-A-CATH LEFT INTERNAL JUGULAR;  Surgeon: Herbert Pun, MD;  Location: [facility] ORS;  Service: General;  Laterality: Left;  . TONSILLECTOMY  1969    SOCIAL HISTORY: Social History   Socioeconomic History  . Marital status: Married    Spouse name: Not on file  . Number of children: 0  . Years of education: Anselm Lis  . Highest education level: Not on file  Occupational History  . Occupation: Full-Time    Comment: [employer] x9  Tobacco Use  . Smoking status: Former Research scientist (life sciences)  . Smokeless tobacco: Never Used  Vaping Use  . Vaping Use: Never used  Substance and  Sexual Activity  . Alcohol use: Yes    Alcohol/week: 0.0 standard drinks    Comment: RARELY  . Drug use: No  . Sexual activity: Yes    Birth control/protection: Post-menopausal  Other Topics Concern  . Not on file  Social History Narrative   Lives close to [place]; with husband. Quit smoking in 2007 [25 years]; ocassional alcohol. 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    Social Determinants of Health   Financial Resource Strain:   . Difficulty of Paying Living Expenses: Not on file  Food Insecurity:   . Worried About Charity fundraiser in the Last Year: Not on file  . Ran Out of Food in the Last Year: Not on file  Transportation Needs:   . Lack of Transportation (Medical): Not on file  . Lack of Transportation (Non-Medical): Not on file  Physical Activity:   . Days of Exercise per Week: Not on file  . Minutes of Exercise per Session: Not on file  Stress:   . Feeling of Stress : Not on file  Social Connections:   . Frequency of Communication with Friends and Family: Not on file  . Frequency of Social Gatherings with Friends and Family: Not on file  . Attends Religious Services: Not on file  . Active Member of Clubs or Organizations: Not on file  . Attends Archivist Meetings:  Not on file  . Marital Status: Not on file  Intimate Partner Violence:   . Fear of Current or Ex-Partner: Not on file  . Emotionally Abused: Not on file  . Physically Abused: Not on file  . Sexually Abused: Not on file    FAMILY HISTORY: Family History  Problem Relation Age of Onset  . Bladder Cancer Mother   . Diabetes Father   . Heart disease Father   . Bladder Cancer Brother   . Breast cancer Neg Hx     ALLERGIES:  is allergic to penicillins.  MEDICATIONS:  Current Outpatient Medications  Medication Sig Dispense Refill  . B COMPLEX VITAMINS PO Take 1 tablet by mouth daily.     . chlorhexidine (PERIDEX) 0.12 % solution Use as directed 15 mLs in the mouth or throat 2 (two) times daily. 240 mL  0  . Cholecalciferol (VITAMIN D) 125 MCG (5000 UT) CAPS Take 5,000 Units by mouth daily.     . COLLAGEN PO Take 1 tablet by mouth daily. Super Collagen w/Biotin    . ibuprofen (ADVIL) 200 MG tablet Take 600 mg by mouth every 8 (eight) hours as needed (for pain.).     Marland Kitchen lidocaine-prilocaine (EMLA) cream Apply 1 application topically as needed. 30 g 0  . methimazole (TAPAZOLE) 5 MG tablet Take 5 mg by mouth daily.     . Multiple Vitamin (MULTIVITAMIN WITH MINERALS) TABS tablet Take 1 tablet by mouth daily.    . Omega 3-6-9 Fatty Acids (OMEGA 3-6-9 COMPLEX) CAPS Take 1 capsule by mouth daily.    . ondansetron (ZOFRAN) 8 MG tablet One pill every 8 hours as needed for nausea/vomitting. 40 tablet 1  . prochlorperazine (COMPAZINE) 10 MG tablet Take 1 tablet (10 mg total) by mouth every 6 (six) hours as needed for nausea or vomiting. 40 tablet 1  . valACYclovir (VALTREX) 500 MG tablet TAKE ONE TABLET BY MOUTH DAILY 30 tablet 11   No current facility-administered medications for this visit.    PHYSICAL EXAMINATION: ECOG PERFORMANCE STATUS: 0 - Asymptomatic  Vitals:   02/19/20 0952  BP: 132/67  Pulse: 77  Resp: 16  Temp: 98.6 F (37 C)  SpO2: 100%   Filed Weights   02/19/20 0952  Weight: 182 lb (82.6 kg)    Physical Exam HENT:     Head: Normocephalic and atraumatic.     Mouth/Throat:     Pharynx: No oropharyngeal exudate.  Eyes:     Pupils: Pupils are equal, round, and reactive to light.  Cardiovascular:     Rate and Rhythm: Normal rate and regular rhythm.  Pulmonary:     Effort: Pulmonary effort is normal. No respiratory distress.     Breath sounds: Normal breath sounds. No wheezing.  Abdominal:     General: Bowel sounds are normal. There is no distension.     Palpations: Abdomen is soft. There is no mass.     Tenderness: There is no abdominal tenderness. There is no guarding or rebound.  Musculoskeletal:        General: No tenderness. Normal range of motion.     Cervical  back: Normal range of motion and neck supple.  Skin:    General: Skin is warm.     Comments: Erythema/warmth noted in the radiation portal.  No desquamation.  Neurological:     Mental Status: She is alert and oriented to person, place, and time.  Psychiatric:        Mood and Affect: Affect  normal.    LABORATORY DATA:  I have reviewed the data as listed Lab Results  Component Value Date   WBC 4.6 02/19/2020   HGB 11.8 (L) 02/19/2020   HCT 37.0 02/19/2020   MCV 86.9 02/19/2020   PLT 203 02/19/2020   Recent Labs    11/20/19 0903 11/20/19 0903 12/18/19 0840 12/18/19 0840 01/01/20 0859 01/22/20 0905 02/19/20 0910  NA 139   < > 143   < > 139 137 140  K 4.0   < > 4.1   < > 4.1 3.9 3.9  CL 107   < > 109   < > 104 102 106  CO2 24   < > 24   < > _0 GLUCOSE 98   < > 105*   < > 106* 215* 203*  BUN 22*   < > 14   < > _1 CREATININE 0.83   < > 0.72   < > 0.81 0.83 0.78  CALCIUM 9.6   < > 9.0   < > 9.5 8.9 9.1  GFRNONAA >60   < > >60   < > >60 >60 >60  GFRAA >60  --  >60  --  >60  --   --   PROT 6.7   < > 6.6   < > 7.1 6.9 7.0  ALBUMIN 4.1   < > 4.0   < > 4.2 4.0 3.8  AST 32   < > 36   < > 39 47* 43*  ALT 36   < > 35   < > 39 50* 46*  ALKPHOS 72   < > 76   < > 76 75 82  BILITOT 0.8   < > 0.5   < > 0.8 0.6 0.5   < > = values in this interval not displayed.    RADIOGRAPHIC STUDIES: I have personally reviewed the radiological images as listed and agreed with the findings in the report. No results found. ASSESSMENT & PLAN:   Carcinoma of upper-outer quadrant of right breast in female, estrogen receptor positive ([place]) #Right breast-invasive mammary carcinoma- S/P LUMPECTOMY pT1a [58m];pN0 [STAGE IA- ER/PRpoS; Her 2 POS]; STAGE I. S/p Taxol-Herceptin; currently on Herceptin maintenance  # Proceed with maintenance herceptin today; Labs today reviewed;  acceptable for treatment today. 10/04- MUGA scan- 64; normal. Discuss AI.   # Right chest wall  rash/Shingles-resolved; continue on Valtrex 500 mg once a day [sec prophy]; STABLE.   # SWOG study/PN study- PN- G-1 STABLE; monitor for now.  # Fatty liver- on [place] AST/ALT -grade 1 slightly elevated; STABLE.  Monitor for now.  # Radiation dermatitis- G-1; STABLE.  cont Aquaphor.   # Disposition:  # Herceptin today #  follow up on Nov 22nd [Tuesday] - MD; labs- cbc/cmp; Herceptin-  Dr.B   All questions were answered. The patient/family knows to call the clinic with any problems, questions or concerns.    GCammie Sickle MD 02/24/2020 3:54 PM

## 2020-03-10 ENCOUNTER — Inpatient Hospital Stay: Payer: 59

## 2020-03-10 ENCOUNTER — Inpatient Hospital Stay (HOSPITAL_BASED_OUTPATIENT_CLINIC_OR_DEPARTMENT_OTHER): Payer: 59 | Admitting: Internal Medicine

## 2020-03-10 VITALS — BP 141/66 | HR 75 | Temp 96.2°F | Resp 16 | Wt 182.2 lb

## 2020-03-10 DIAGNOSIS — Z170 Estrogen receptor positive status [ER+]: Secondary | ICD-10-CM

## 2020-03-10 DIAGNOSIS — Z79811 Long term (current) use of aromatase inhibitors: Secondary | ICD-10-CM

## 2020-03-10 DIAGNOSIS — C50411 Malignant neoplasm of upper-outer quadrant of right female breast: Secondary | ICD-10-CM

## 2020-03-10 DIAGNOSIS — Z5112 Encounter for antineoplastic immunotherapy: Secondary | ICD-10-CM | POA: Diagnosis not present

## 2020-03-10 LAB — COMPREHENSIVE METABOLIC PANEL
ALT: 48 U/L — ABNORMAL HIGH (ref 0–44)
AST: 43 U/L — ABNORMAL HIGH (ref 15–41)
Albumin: 4 g/dL (ref 3.5–5.0)
Alkaline Phosphatase: 78 U/L (ref 38–126)
Anion gap: 12 (ref 5–15)
BUN: 14 mg/dL (ref 6–20)
CO2: 23 mmol/L (ref 22–32)
Calcium: 9.4 mg/dL (ref 8.9–10.3)
Chloride: 105 mmol/L (ref 98–111)
Creatinine, Ser: 0.87 mg/dL (ref 0.44–1.00)
GFR, Estimated: >60 mL/min (ref >60)
Glucose, Bld: 216 mg/dL — ABNORMAL HIGH (ref 70–99)
Potassium: 3.9 mmol/L (ref 3.5–5.1)
Sodium: 140 mmol/L (ref 135–145)
Total Bilirubin: 0.5 mg/dL (ref 0.3–1.2)
Total Protein: 6.9 g/dL (ref 6.5–8.1)

## 2020-03-10 LAB — CBC WITH DIFFERENTIAL/PLATELET
Abs Immature Granulocytes: 0.04 10*3/uL (ref 0.00–0.07)
Basophils Absolute: 0.1 10*3/uL (ref 0.0–0.1)
Basophils Relative: 2 %
Eosinophils Absolute: 0.3 10*3/uL (ref 0.0–0.5)
Eosinophils Relative: 5 %
HCT: 36.5 % (ref 36.0–46.0)
Hemoglobin: 11.7 g/dL — ABNORMAL LOW (ref 12.0–15.0)
Immature Granulocytes: 1 %
Lymphocytes Relative: 23 %
Lymphs Abs: 1.1 10*3/uL (ref 0.7–4.0)
MCH: 27.2 pg (ref 26.0–34.0)
MCHC: 32.1 g/dL (ref 30.0–36.0)
MCV: 84.9 fL (ref 80.0–100.0)
Monocytes Absolute: 0.4 10*3/uL (ref 0.1–1.0)
Monocytes Relative: 8 %
Neutro Abs: 2.9 10*3/uL (ref 1.7–7.7)
Neutrophils Relative %: 61 %
Platelets: 209 10*3/uL (ref 150–400)
RBC: 4.3 MIL/uL (ref 3.87–5.11)
RDW: 15 % (ref 11.5–15.5)
WBC: 4.8 10*3/uL (ref 4.0–10.5)
nRBC: 0 % (ref 0.0–0.2)

## 2020-03-10 MED ORDER — ACETAMINOPHEN 325 MG PO TABS
650.0000 mg | ORAL_TABLET | Freq: Once | ORAL | Status: AC
  Administered 2020-03-10: 650 mg via ORAL
  Filled 2020-03-10: qty 2

## 2020-03-10 MED ORDER — TRASTUZUMAB-ANNS CHEMO 150 MG IV SOLR
450.0000 mg | Freq: Once | INTRAVENOUS | Status: AC
  Administered 2020-03-10: 450 mg via INTRAVENOUS
  Filled 2020-03-10: qty 21.43

## 2020-03-10 MED ORDER — SODIUM CHLORIDE 0.9 % IV SOLN
Freq: Once | INTRAVENOUS | Status: AC
  Filled 2020-03-10: qty 250

## 2020-03-10 MED ORDER — ANASTROZOLE 1 MG PO TABS: 1.0000 mg | ORAL_TABLET | Freq: Every day | ORAL | 1 refills | Status: DC

## 2020-03-10 MED ORDER — SODIUM CHLORIDE 0.9% FLUSH
10.0000 mL | INTRAVENOUS | Status: DC | PRN
  Administered 2020-03-10: 10 mL via INTRAVENOUS
  Filled 2020-03-10: qty 10

## 2020-03-10 MED ORDER — DIPHENHYDRAMINE HCL 25 MG PO CAPS
25.0000 mg | ORAL_CAPSULE | Freq: Once | ORAL | Status: AC
  Administered 2020-03-10: 25 mg via ORAL
  Filled 2020-03-10: qty 1

## 2020-03-10 MED ORDER — HEPARIN SOD (PORK) LOCK FLUSH 100 UNIT/ML IV SOLN
500.0000 [IU] | Freq: Once | INTRAVENOUS | Status: AC
  Administered 2020-03-10: 500 [IU] via INTRAVENOUS
  Filled 2020-03-10: qty 5

## 2020-03-10 MED ORDER — HEPARIN SOD (PORK) LOCK FLUSH 100 UNIT/ML IV SOLN
INTRAVENOUS | Status: AC
  Filled 2020-03-10: qty 5

## 2020-03-10 NOTE — Progress Notes (Signed)
[place] NOTE  Patient Care Team: Birdie Sons, MD as PCP - General (Family Medicine) [facility] (Gynecology) Theodore Demark, RN as Oncology Nurse Navigator Cammie Sickle, MD as Consulting Physician (Hematology and Oncology)  CHIEF COMPLAINTS/PURPOSE OF CONSULTATION: Breast cancer  #  Oncology History Overview Note  # MARCH 2021- RUOQ- 64m- [BREAST, RIGHT, LATERAL POSTERIOR  - INVASIVE MAMMARY CARCINOMA, NO SPECIAL TYPE, ASSOCIATED WITH  CALCIFICATIONS.][place] ER-> 90%; PR- 50-90%; HER 2 FISH- POSITIVE  # S/P LUMPECTOMY [Dr.Cintron] pT1a [482m;pN0 [STAGE IA- ER/PRpoS; Her 2 POS]  . BREAST, RIGHT, LATERAL MIDDLE DEPTH (RIBBON-SHAPED CLIP):  STEREOTACTIC-GUIDED CORE BIOPSY: - DUCTAL CARCINOMA IN SITU (DCIS), HIGH-GRADE, ASSOCIATED WITH  CALCIFICATIONS.   # May 14th,2021- TH-H; s/p RT [finished OCT 2021]; NOV 23rd 2021- start Anastrazole  #Hypothyroidism-methimazole [Dr.Solum]  # SURVIVORSHIP:   # GENETICS:   DIAGNOSIS: Breast cancer  STAGE:   1      ;  GOALS: Cure  CURRENT/MOST RECENT THERAPY : Taxol Herceptin   Carcinoma of upper-outer quadrant of right breast in [place], estrogen receptor positive [place] Diagnosis   Carcinoma of upper-outer quadrant of right breast in [place], estrogen receptor positive ([facility]  08/28/2019 -  Chemotherapy   The patient had PACLitaxel (TAXOL) 156 mg in sodium chloride 0.9 % 250 mL chemo infusion (</= 8077m2), 80 mg/m2 = 156 mg, Intravenous,  Once, 3 of 3 cycles Administration: 156 mg (08/28/2019), 156 mg (09/04/2019), 156 mg (09/25/2019), 156 mg (09/11/2019), 156 mg (09/18/2019), 156 mg (10/02/2019), 156 mg (10/09/2019), 156 mg (10/16/2019), 156 mg (10/23/2019), 156 mg (10/30/2019), 156 mg (11/06/2019), 156 mg (11/14/2019) trastuzumab-anns (KANJINTI) 336 mg in sodium chloride 0.9 % 250 mL chemo infusion, 4 mg/kg = 336 mg (100 % of original dose 4 mg/kg), Intravenous,  Once, 8 of 16 cycles Dose  modification: 4 mg/kg (original dose 4 mg/kg, Cycle 1, Reason: Other (see comments), Comment: loading dose, biosimilar per insurance), 8 mg/kg (original dose 2 mg/kg, Cycle 7, Reason: Other (see comments), Comment: reload (>1week )), 6 mg/kg (original dose 2 mg/kg, Cycle 8, Reason: Provider Judgment, Comment: for every 3 week dosing) Administration: 336 mg (08/28/2019), 150 mg (09/04/2019), 150 mg (09/25/2019), 150 mg (11/20/2019), 150 mg (01/01/2020), 150 mg (09/11/2019), 150 mg (09/18/2019), 150 mg (10/02/2019), 150 mg (10/09/2019), 150 mg (10/16/2019), 150 mg (10/23/2019), 150 mg (10/30/2019), 150 mg (11/06/2019), 150 mg (11/14/2019), 150 mg (01/22/2020), 651 mg (02/19/2020), 450 mg (03/10/2020)  for chemotherapy treatment.      HISTORY OF PRESENTING ILLNESS:  [place] [age]o.  female with breast cancer early stage-ER/PR positive HER-2/neu positive on adjuvant  Herceptin maintenance is here for follow-up.  [place].  Appetite is good with no weight loss.  Mild tingling and numbness in extremities not any worse.  No headaches.  Review of Systems  Constitutional: Positive for malaise/fatigue. Negative for chills, diaphoresis, fever and weight loss.  HENT: Negative for nosebleeds and sore throat.   Eyes: Negative for double vision.  Respiratory: Negative for cough, hemoptysis, sputum production, shortness of breath and wheezing.   Cardiovascular: Negative for chest pain, palpitations, orthopnea and leg swelling.  Gastrointestinal: Negative for abdominal pain, blood in stool, constipation, diarrhea, heartburn, melena, nausea and vomiting.  Genitourinary: Negative for dysuria, frequency and urgency.  Musculoskeletal: Negative for back pain and joint pain.  Skin: Negative for itching.  Neurological: Positive for tingling. Negative for dizziness,  focal weakness, weakness and headaches.  Endo/Heme/Allergies: Does not bruise/bleed easily.   Psychiatric/Behavioral: Negative for depression. The patient is not nervous/anxious and does not have insomnia.      MEDICAL HISTORY:  Past Medical History:  Diagnosis Date  . Atypical mole 09/26/2018   right mid anterior side/mild  . Basal cell carcinoma 08/25/2014   right med cheek infraorbital  . Breast cancer ([place]) 07/2019   [place] and DCIS  . Dyslipidemia   . Family history of bladder cancer   . Graves disease   . Hyperthyroidism   . Inborn lipid storage disorder   . Leiomyoma of uterus   . Vitamin D deficiency     SURGICAL HISTORY: Past Surgical History:  Procedure Laterality Date  . APPENDECTOMY  1982   Cyst removed from ovaries   . BREAST BIOPSY Right 07/05/2019   stereo bx, coil clip, IMC  . BREAST BIOPSY Right 07/05/2019   stereo bx, ribbon clip, DCIS   . BREAST LUMPECTOMY Right 07/31/2019    [facility] and DCIS with SN bx  . COLONOSCOPY WITH PROPOFOL N/A 01/20/2017   Procedure: COLONOSCOPY WITH PROPOFOL;  Surgeon: Jonathon Bellows, MD;  Location: [facility] ENDOSCOPY;  Service: Gastroenterology;  Laterality: N/A;  . [place]  . NECK SURGERY  2013   LIft from weight loss  . PARTIAL MASTECTOMY WITH NEEDLE LOCALIZATION AND AXILLARY SENTINEL LYMPH NODE BX Right 07/31/2019   Procedure: RIGHT PARTIAL MASTECTOMY WITH BRACKETED NEEDLE LOCALIZATION AND LEFT AXILLARY SENTINEL LYMPH NODE BIOPSY;  Surgeon: Herbert Pun, MD;  Location: [facility] ORS;  Service: General;  Laterality: Right;  . PORTACATH PLACEMENT Left 07/31/2019   Procedure: INSERTION PORT-A-CATH LEFT INTERNAL JUGULAR;  Surgeon: Herbert Pun, MD;  Location: [facility] ORS;  Service: General;  Laterality: Left;  . TONSILLECTOMY  1969    SOCIAL HISTORY: Social History   Socioeconomic History  . Marital status: Married    Spouse name: Not on file  . Number of children: 0  . Years of education: Anselm Lis  . Highest education level: Not on file  Occupational History  . Occupation: Full-Time     Comment: [employer] x9  Tobacco Use  . Smoking status: Former Research scientist (life sciences)  . Smokeless tobacco: Never Used  Vaping Use  . Vaping Use: Never used  Substance and Sexual Activity  . Alcohol use: Yes    Alcohol/week: 0.0 standard drinks    Comment: RARELY  . Drug use: No  . Sexual activity: Yes    Birth control/protection: Post-menopausal  Other Topics Concern  . Not on file  Social History Narrative   Lives close to [place]; with husband. Quit smoking in 2007 [25 years]; ocassional alcohol. 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    Social Determinants of Health   Financial Resource Strain:   . Difficulty of Paying Living Expenses: Not on file  Food Insecurity:   . Worried About Charity fundraiser in the Last Year: Not on file  . Ran Out of Food in the Last Year: Not on file  Transportation Needs:   . Lack of Transportation (Medical): Not on file  . Lack of Transportation (Non-Medical): Not on file  Physical Activity:   . Days of Exercise per Week: Not on file  . Minutes of Exercise per Session: Not on file  Stress:   . Feeling of Stress : Not on file  Social Connections:   . Frequency of Communication with Friends and Family: Not on file  . Frequency of Social  Gatherings with Friends and Family: Not on file  . Attends Religious Services: Not on file  . Active Member of Clubs or Organizations: Not on file  . Attends Archivist Meetings: Not on file  . Marital Status: Not on file  Intimate Partner Violence:   . Fear of Current or Ex-Partner: Not on file  . Emotionally Abused: Not on file  . Physically Abused: Not on file  . Sexually Abused: Not on file    FAMILY HISTORY: Family History  Problem Relation Age of Onset  . Bladder Cancer Mother   . Diabetes Father   . Heart disease Father   . Bladder Cancer Brother   . Breast cancer Neg Hx     ALLERGIES:  is allergic to penicillins.  MEDICATIONS:  Current Outpatient Medications  Medication Sig Dispense  Refill  . B COMPLEX VITAMINS PO Take 1 tablet by mouth daily.     . chlorhexidine (PERIDEX) 0.12 % solution Use as directed 15 mLs in the mouth or throat 2 (two) times daily. 240 mL 0  . Cholecalciferol (VITAMIN D) 125 MCG (5000 UT) CAPS Take 5,000 Units by mouth daily.     . COLLAGEN PO Take 1 tablet by mouth daily. Super Collagen w/Biotin    . ibuprofen (ADVIL) 200 MG tablet Take 600 mg by mouth every 8 (eight) hours as needed (for pain.).     Marland Kitchen lidocaine-prilocaine (EMLA) cream Apply 1 application topically as needed. 30 g 0  . methimazole (TAPAZOLE) 5 MG tablet Take 5 mg by mouth daily.     . Multiple Vitamin (MULTIVITAMIN WITH MINERALS) TABS tablet Take 1 tablet by mouth daily.    . Omega 3-6-9 Fatty Acids (OMEGA 3-6-9 COMPLEX) CAPS Take 1 capsule by mouth daily.    . ondansetron (ZOFRAN) 8 MG tablet One pill every 8 hours as needed for nausea/vomitting. 40 tablet 1  . prochlorperazine (COMPAZINE) 10 MG tablet Take 1 tablet (10 mg total) by mouth every 6 (six) hours as needed for nausea or vomiting. 40 tablet 1  . valACYclovir (VALTREX) 500 MG tablet TAKE ONE TABLET BY MOUTH DAILY 30 tablet 11  . anastrozole (ARIMIDEX) 1 MG tablet Take 1 tablet (1 mg total) by mouth daily. 90 tablet 1   No current facility-administered medications for this visit.   Facility-Administered Medications Ordered in Other Visits  Medication Dose Route Frequency Provider Last Rate Last Admin  . sodium chloride flush (NS) 0.9 % injection 10 mL  10 mL Intravenous PRN Cammie Sickle, MD   10 mL at 03/10/20 0922    PHYSICAL EXAMINATION: ECOG PERFORMANCE STATUS: 0 - Asymptomatic  Vitals:   03/10/20 0946  BP: (!) 141/66  Pulse: 75  Resp: 16  Temp: (!) 96.2 F (35.7 C)  SpO2: 99%   Filed Weights   03/10/20 0946  Weight: 182 lb 3.2 oz (82.6 kg)    Physical Exam HENT:     Head: Normocephalic and atraumatic.     Mouth/Throat:     Pharynx: No oropharyngeal exudate.  Eyes:     Pupils: Pupils are  equal, round, and reactive to light.  Cardiovascular:     Rate and Rhythm: Normal rate and regular rhythm.  Pulmonary:     Effort: Pulmonary effort is normal. No respiratory distress.     Breath sounds: Normal breath sounds. No wheezing.  Abdominal:     General: Bowel sounds are normal. There is no distension.     Palpations: Abdomen is soft. There  is no mass.     Tenderness: There is no abdominal tenderness. There is no guarding or rebound.  Musculoskeletal:        General: No tenderness. Normal range of motion.     Cervical back: Normal range of motion and neck supple.  Skin:    General: Skin is warm.  Neurological:     Mental Status: She is alert and oriented to person, place, and time.  Psychiatric:        Mood and Affect: Affect normal.    LABORATORY DATA:  I have reviewed the data as listed Lab Results  Component Value Date   WBC 4.8 03/10/2020   HGB 11.7 (L) 03/10/2020   HCT 36.5 03/10/2020   MCV 84.9 03/10/2020   PLT 209 03/10/2020   Recent Labs    11/20/19 0903 11/20/19 0903 12/18/19 0840 12/18/19 0840 01/01/20 0859 01/01/20 0859 01/22/20 0905 02/19/20 0910 03/10/20 0922  NA 139   < > 143   < > 139   < > 137 140 140  K 4.0   < > 4.1   < > 4.1   < > 3.9 3.9 3.9  CL 107   < > 109   < > 104   < > 102 106 105  CO2 24   < > 24   < > 24   < > '22 24 23  ' GLUCOSE 98   < > 105*   < > 106*   < > 215* 203* 216*  BUN 22*   < > 14   < > 16   < > '16 14 14  ' CREATININE 0.83   < > 0.72   < > 0.81   < > 0.83 0.78 0.87  CALCIUM 9.6   < > 9.0   < > 9.5   < > 8.9 9.1 9.4  GFRNONAA >60   < > >60   < > >60   < > >60 >60 >60  GFRAA >60  --  >60  --  >60  --   --   --   --   PROT 6.7   < > 6.6   < > 7.1   < > 6.9 7.0 6.9  ALBUMIN 4.1   < > 4.0   < > 4.2   < > 4.0 3.8 4.0  AST 32   < > 36   < > 39   < > 47* 43* 43*  ALT 36   < > 35   < > 39   < > 50* 46* 48*  ALKPHOS 72   < > 76   < > 76   < > 75 82 78  BILITOT 0.8   < > 0.5   < > 0.8   < > 0.6 0.5 0.5   < > = values in this  interval not displayed.    RADIOGRAPHIC STUDIES: I have personally reviewed the radiological images as listed and agreed with the findings in the report. No results found. ASSESSMENT & PLAN:   Carcinoma of upper-outer quadrant of right breast in female, estrogen receptor positive ([place]) #Right breast-invasive mammary carcinoma- S/P LUMPECTOMY pT1a [3m];pN0 [STAGE IA- ER/PRpoS; Her 2 POS]; STAGE I. S/p Taxol-Herceptin; currently on Herceptin maintenance  # Proceed with maintenance herceptin today; Labs today reviewed;  acceptable for treatment today. 10/04- MUGA scan- 64; normal.   # Discussed the mechanism of action of aromatase inhibitors-with blocking of estrogen to prevent breast cancer.  Also discussed  the potential side effects including but not limited to arthralgias hot flashes and increased risk of osteoporosis. Will get baseline BMD. Start anastrozole 47m/day.   # Right chest wall rash/Shingles-resolved; continue on Valtrex 500 mg once a day [sec prophy]; STABLE  # SWOG study/PN study- PN- G-1 STABLE; monitor for now.  # Fatty liver- on [place] AST/ALT -grade 1 slightly elevated; STABLE;   Monitor for now.  # Disposition:  # Herceptin today #  follow up in 3weeks- MD; labs- cbc/cmp; Herceptin; BMD prior--  Dr.B   All questions were answered. The patient/family knows to call the clinic with any problems, questions or concerns.    GCammie Sickle MD 03/10/2020 2:10 PM

## 2020-03-10 NOTE — Assessment & Plan Note (Addendum)
#  Right breast-invasive mammary carcinoma- S/P LUMPECTOMY pT1a [67mm];pN0 [STAGE IA- ER/PRpoS; Her 2 POS]; STAGE I. S/p Taxol-Herceptin; currently on Herceptin maintenance  # Proceed with maintenance herceptin today; Labs today reviewed;  acceptable for treatment today. 10/04- MUGA scan- 64; normal.   # Discussed the mechanism of action of aromatase inhibitors-with blocking of estrogen to prevent breast cancer.  Also discussed the potential side effects including but not limited to arthralgias hot flashes and increased risk of osteoporosis. Will get baseline BMD. Start anastrozole 1mg /day.   # Right chest wall rash/Shingles-resolved; continue on Valtrex 500 mg once a day [sec prophy]; STABLE  # SWOG study/PN study- PN- G-1 STABLE; monitor for now.  # Fatty liver- on [place]- AST/ALT -grade 1 slightly elevated; STABLE;   Monitor for now.  # Disposition:  # Herceptin today #  follow up in 3weeks- MD; labs- cbc/cmp; Herceptin; BMD prior--  Dr.B

## 2020-03-30 ENCOUNTER — Other Ambulatory Visit: Payer: Self-pay

## 2020-03-30 ENCOUNTER — Ambulatory Visit
Admission: RE | Admit: 2020-03-30 | Discharge: 2020-03-30 | Disposition: A | Discharge status: Home / Self Care | Payer: 59 | Source: Ambulatory Visit | Attending: Internal Medicine | Admitting: Internal Medicine

## 2020-03-30 DIAGNOSIS — Z79811 Long term (current) use of aromatase inhibitors: Secondary | ICD-10-CM | POA: Insufficient documentation

## 2020-03-30 DIAGNOSIS — Z170 Estrogen receptor positive status [ER+]: Secondary | ICD-10-CM | POA: Diagnosis not present

## 2020-03-30 DIAGNOSIS — C50411 Malignant neoplasm of upper-outer quadrant of right female breast: Secondary | ICD-10-CM | POA: Insufficient documentation

## 2020-03-30 DIAGNOSIS — E559 Vitamin D deficiency, unspecified: Secondary | ICD-10-CM | POA: Diagnosis not present

## 2020-03-30 DIAGNOSIS — Z853 Personal history of malignant neoplasm of breast: Secondary | ICD-10-CM | POA: Diagnosis not present

## 2020-03-30 DIAGNOSIS — M85852 Other specified disorders of bone density and structure, left thigh: Secondary | ICD-10-CM | POA: Diagnosis not present

## 2020-03-31 ENCOUNTER — Encounter: Payer: Self-pay | Admitting: Internal Medicine

## 2020-03-31 ENCOUNTER — Inpatient Hospital Stay: Payer: 59

## 2020-03-31 ENCOUNTER — Inpatient Hospital Stay (HOSPITAL_BASED_OUTPATIENT_CLINIC_OR_DEPARTMENT_OTHER): Payer: 59 | Admitting: Internal Medicine

## 2020-03-31 ENCOUNTER — Inpatient Hospital Stay: Payer: 59 | Attending: Internal Medicine

## 2020-03-31 VITALS — BP 126/66 | HR 70 | Temp 96.9°F | Resp 16 | Ht 67.0 in | Wt 178.0 lb

## 2020-03-31 DIAGNOSIS — Z170 Estrogen receptor positive status [ER+]: Secondary | ICD-10-CM

## 2020-03-31 DIAGNOSIS — M85852 Other specified disorders of bone density and structure, left thigh: Secondary | ICD-10-CM | POA: Diagnosis not present

## 2020-03-31 DIAGNOSIS — C50411 Malignant neoplasm of upper-outer quadrant of right female breast: Secondary | ICD-10-CM

## 2020-03-31 DIAGNOSIS — Z5112 Encounter for antineoplastic immunotherapy: Secondary | ICD-10-CM | POA: Insufficient documentation

## 2020-03-31 LAB — CBC WITH DIFFERENTIAL/PLATELET
Abs Immature Granulocytes: 0.01 10*3/uL (ref 0.00–0.07)
Basophils Absolute: 0.1 10*3/uL (ref 0.0–0.1)
Basophils Relative: 2 %
Eosinophils Absolute: 0.3 10*3/uL (ref 0.0–0.5)
Eosinophils Relative: 6 %
HCT: 36.1 % (ref 36.0–46.0)
Hemoglobin: 11.7 g/dL — ABNORMAL LOW (ref 12.0–15.0)
Immature Granulocytes: 0 %
Lymphocytes Relative: 19 %
Lymphs Abs: 0.9 10*3/uL (ref 0.7–4.0)
MCH: 27.9 pg (ref 26.0–34.0)
MCHC: 32.4 g/dL (ref 30.0–36.0)
MCV: 86.2 fL (ref 80.0–100.0)
Monocytes Absolute: 0.7 10*3/uL (ref 0.1–1.0)
Monocytes Relative: 14 %
Neutro Abs: 2.9 10*3/uL (ref 1.7–7.7)
Neutrophils Relative %: 59 %
Platelets: 197 10*3/uL (ref 150–400)
RBC: 4.19 MIL/uL (ref 3.87–5.11)
RDW: 15.3 % (ref 11.5–15.5)
WBC: 4.9 10*3/uL (ref 4.0–10.5)
nRBC: 0 % (ref 0.0–0.2)

## 2020-03-31 LAB — COMPREHENSIVE METABOLIC PANEL WITH GFR
ALT: 49 U/L — ABNORMAL HIGH (ref 0–44)
AST: 45 U/L — ABNORMAL HIGH (ref 15–41)
Albumin: 4.2 g/dL (ref 3.5–5.0)
Alkaline Phosphatase: 76 U/L (ref 38–126)
Anion gap: 11 (ref 5–15)
BUN: 13 mg/dL (ref 6–20)
CO2: 22 mmol/L (ref 22–32)
Calcium: 9.2 mg/dL (ref 8.9–10.3)
Chloride: 104 mmol/L (ref 98–111)
Creatinine, Ser: 0.75 mg/dL (ref 0.44–1.00)
GFR, Estimated: >60 mL/min (ref >60)
Glucose, Bld: 109 mg/dL — ABNORMAL HIGH (ref 70–99)
Potassium: 3.8 mmol/L (ref 3.5–5.1)
Sodium: 137 mmol/L (ref 135–145)
Total Bilirubin: 0.6 mg/dL (ref 0.3–1.2)
Total Protein: 6.8 g/dL (ref 6.5–8.1)

## 2020-03-31 MED ORDER — SODIUM CHLORIDE 0.9% FLUSH
10.0000 mL | INTRAVENOUS | Status: DC | PRN
  Administered 2020-03-31: 08:00:00 10 mL via INTRAVENOUS
  Filled 2020-03-31: qty 10

## 2020-03-31 MED ORDER — DIPHENHYDRAMINE HCL 25 MG PO CAPS
25.0000 mg | ORAL_CAPSULE | Freq: Once | ORAL | Status: AC
  Administered 2020-03-31: 09:00:00 25 mg via ORAL
  Filled 2020-03-31: qty 1

## 2020-03-31 MED ORDER — HEPARIN SOD (PORK) LOCK FLUSH 100 UNIT/ML IV SOLN
INTRAVENOUS | Status: AC
  Filled 2020-03-31: qty 5

## 2020-03-31 MED ORDER — ACETAMINOPHEN 325 MG PO TABS
650.0000 mg | ORAL_TABLET | Freq: Once | ORAL | Status: AC
  Administered 2020-03-31: 09:00:00 650 mg via ORAL
  Filled 2020-03-31: qty 2

## 2020-03-31 MED ORDER — HEPARIN SOD (PORK) LOCK FLUSH 100 UNIT/ML IV SOLN
500.0000 [IU] | Freq: Once | INTRAVENOUS | Status: AC
  Administered 2020-03-31: 11:00:00 500 [IU] via INTRAVENOUS
  Filled 2020-03-31: qty 5

## 2020-03-31 MED ORDER — TRASTUZUMAB-ANNS CHEMO 150 MG IV SOLR
450.0000 mg | Freq: Once | INTRAVENOUS | Status: AC
  Administered 2020-03-31: 10:00:00 450 mg via INTRAVENOUS
  Filled 2020-03-31: qty 21.43

## 2020-03-31 MED ORDER — SODIUM CHLORIDE 0.9 % IV SOLN
Freq: Once | INTRAVENOUS | Status: AC
  Filled 2020-03-31: qty 250

## 2020-03-31 NOTE — Assessment & Plan Note (Addendum)
#  Right breast-invasive mammary carcinoma- S/P LUMPECTOMY pT1a [30mm];pN0 [STAGE IA- ER/PRpoS; Her 2 POS]; STAGE I. S/p Taxol-Herceptin; currently on Herceptin maintenance. On Anastrazole. STABLE.    # Proceed with maintenance herceptin today; Labs today reviewed;  acceptable for treatment today. 10/04- MUGA scan- 64; normal.   # Right chest wall rash/Shingles-resolved; continue on Valtrex 500 mg once a day [sec prophy]; STABLE.   # SWOG study/PN study- PN- G-1 STABLE;   # Fatty liver- on [place]- AST/ALT -grade 1 slightly elevated;  STABLE;   Monitor for now.  # OSTEOPENIA -BMD [ dec 2021-T-score of -1.2].  Discussed the potential risk factors for osteoporosis- age/gender/postmenopausal status/use of anti-estrogen treatments. Discussed multiple options including exercise/ calcium and vitamin D supplementation/ and also use of bisphosphonates. Discussed oral bisphosphonates versus parenteral bisphosphonate like Reclast/ RANK ligand inhibitors- desnosumab. Discussed the potential benefits and/side effects  Including but not limited to Osteonecrosis of jaw/ hypocalcemia. Also discussed the role of adjuvant zometa every 6 months.  Patient interested.  We'll start next visit.   # Disposition:  # Herceptin today #  follow up in 3weeks-X- MD;[Thursday] labs- cbc/cmp; Herceptin; zometa-   #  follow up in 6 weeks-X- MD;[Thursday] labs- cbc/cmp; Herceptin; -Dr.B

## 2020-03-31 NOTE — Progress Notes (Signed)
one [place] NOTE  Patient Care Team: Birdie Sons, MD as PCP - General (Family Medicine) [facility] Gyn (Gynecology) Theodore Demark, RN as Oncology Nurse Navigator Cammie Sickle, MD as Consulting Physician (Hematology and Oncology)  CHIEF COMPLAINTS/PURPOSE OF CONSULTATION: Breast cancer  #  Oncology History Overview Note  # MARCH 2021- RUOQ- 58m- [BREAST, RIGHT, LATERAL POSTERIOR  - INVASIVE MAMMARY CARCINOMA, NO SPECIAL TYPE, ASSOCIATED WITH  CALCIFICATIONS.][place] ER-> 90%; PR- 50-90%; HER 2 FISH- POSITIVE  # S/P LUMPECTOMY [Dr.Cintron] pT1a [478m;pN0 [STAGE IA- ER/PRpoS; Her 2 POS]  . BREAST, RIGHT, LATERAL MIDDLE DEPTH (RIBBON-SHAPED CLIP):  STEREOTACTIC-GUIDED CORE BIOPSY: - DUCTAL CARCINOMA IN SITU (DCIS), HIGH-GRADE, ASSOCIATED WITH  CALCIFICATIONS.   # May 14th,2021- TH-H; s/p RT [finished OCT 2021]; NOV 23rd 2021- start Anastrazole  #Hypothyroidism-methimazole [Dr.Solum]  # SURVIVORSHIP:   # GENETICS:   DIAGNOSIS: Breast cancer  STAGE:   1      ;  GOALS: Cure  CURRENT/MOST RECENT THERAPY : Taxol Herceptin   Carcinoma of upper-outer quadrant of right breast in female, estrogen receptor positive ([place] 07/15/[place] Initial Diagnosis   Carcinoma of upper-outer quadrant of right breast in female, estrogen receptor positive ([facility]  08/28/2019 -  Chemotherapy   The patient had PACLitaxel (TAXOL) 156 mg in sodium chloride 0.9 % 250 mL chemo infusion (</= 8064m2), 80 mg/m2 = 156 mg, Intravenous,  Once, 3 of 3 cycles Administration: 156 mg (08/28/2019), 156 mg (09/04/2019), 156 mg (09/25/2019), 156 mg (09/11/2019), 156 mg (09/18/2019), 156 mg (10/02/2019), 156 mg (10/09/2019), 156 mg (10/16/2019), 156 mg (10/23/2019), 156 mg (10/30/2019), 156 mg (11/06/2019), 156 mg (11/14/2019) trastuzumab-anns (KANJINTI) 336 mg in sodium chloride 0.9 % 250 mL chemo infusion, 4 mg/kg = 336 mg (100 % of original dose 4 mg/kg), Intravenous,  Once, 9 of 16 cycles Dose  modification: 4 mg/kg (original dose 4 mg/kg, Cycle 1, Reason: Other (see comments), Comment: loading dose, biosimilar per insurance), 8 mg/kg (original dose 2 mg/kg, Cycle 7, Reason: Other (see comments), Comment: reload (>1week )), 6 mg/kg (original dose 2 mg/kg, Cycle 8, Reason: Provider Judgment, Comment: for every 3 week dosing) Administration: 336 mg (08/28/2019), 150 mg (09/04/2019), 150 mg (09/25/2019), 150 mg (11/20/2019), 150 mg (01/01/2020), 150 mg (09/11/2019), 150 mg (09/18/2019), 150 mg (10/02/2019), 150 mg (10/09/2019), 150 mg (10/16/2019), 150 mg (10/23/2019), 150 mg (10/30/2019), 150 mg (11/06/2019), 150 mg (11/14/2019), 150 mg (01/22/2020), 651 mg (02/19/2020), 450 mg (03/10/2020), 450 mg (03/31/2020)  for chemotherapy treatment.      HISTORY OF PRESENTING ILLNESS:  AngEmmaleeSmall [age]o.  female with breast cancer early stage-ER/PR positive HER-2/neu positive on adjuvant  Herceptin maintenance is here for follow-up.  Patient denies any joint pains body aches or hot flashes from her anastrozole.  Denies any lumps or bumps.  Appetite is good. Chronic mild tenderness in the extremities.  Review of Systems  Constitutional: Positive for malaise/fatigue. Negative for chills, diaphoresis, fever and weight loss.  HENT: Negative for nosebleeds and sore throat.   Eyes: Negative for double vision.  Respiratory: Negative for cough, hemoptysis, sputum production, shortness of breath and wheezing.   Cardiovascular: Negative for chest pain, palpitations, orthopnea and leg swelling.  Gastrointestinal: Negative for abdominal pain, blood in stool, constipation, diarrhea, heartburn, melena, nausea and vomiting.  Genitourinary: Negative for dysuria, frequency and urgency.  Musculoskeletal: Negative for back pain and joint pain.  Skin: Negative for itching.  Neurological: Positive for tingling. Negative for dizziness, focal weakness, weakness and  headaches.  Endo/Heme/Allergies: Does not bruise/bleed easily.   Psychiatric/Behavioral: Negative for depression. The patient is not nervous/anxious and does not have insomnia.      MEDICAL HISTORY:  Past Medical History:  Diagnosis Date  . Atypical mole 09/26/2018   right mid anterior side/mild  . Basal cell carcinoma 08/25/2014   right med cheek infraorbital  . Breast cancer ([place]) 07/2019   [place] and DCIS  . Dyslipidemia   . Family history of bladder cancer   . Graves disease   . Hyperthyroidism   . Inborn lipid storage disorder   . Leiomyoma of uterus   . Vitamin D deficiency     SURGICAL HISTORY: Past Surgical History:  Procedure Laterality Date  . APPENDECTOMY  1982   Cyst removed from ovaries   . BREAST BIOPSY Right 07/05/2019   stereo bx, coil clip, IMC  . BREAST BIOPSY Right 07/05/2019   stereo bx, ribbon clip, DCIS   . BREAST LUMPECTOMY Right 07/31/2019    [facility] and DCIS with SN bx  . COLONOSCOPY WITH PROPOFOL N/A 01/20/2017   Procedure: COLONOSCOPY WITH PROPOFOL;  Surgeon: Jonathon Bellows, MD;  Location[facility] ENDOSCOPY;  Service: Gastroenterology;  Laterality: N/A;  . [place]  . NECK SURGERY  2013   LIft from weight loss  . PARTIAL MASTECTOMY WITH NEEDLE LOCALIZATION AND AXILLARY SENTINEL LYMPH NODE BX Right 07/31/2019   Procedure: RIGHT PARTIAL MASTECTOMY WITH BRACKETED NEEDLE LOCALIZATION AND LEFT AXILLARY SENTINEL LYMPH NODE BIOPSY;  Surgeon: Herbert Pun, MD;  Location: [facility] ORS;  Service: General;  Laterality: Right;  . PORTACATH PLACEMENT Left 07/31/2019   Procedure: INSERTION PORT-A-CATH LEFT INTERNAL JUGULAR;  Surgeon: Herbert Pun, MD;  Location: [facility] ORS;  Service: General;  Laterality: Left;  . TONSILLECTOMY  1969    SOCIAL HISTORY: Social History   Socioeconomic History  . Marital status: Married    Spouse name: Not on file  . Number of children: 0  . Years of education: Anselm Lis  . Highest education level: Not on file  Occupational History  . Occupation: Full-Time     Comment: [employer]  Tobacco Use  . Smoking status: Former Research scientist (life sciences)  . Smokeless tobacco: Never Used  Vaping Use  . Vaping Use: Never used  Substance and Sexual Activity  . Alcohol use: Yes    Alcohol/week: 0.0 standard drinks    Comment: RARELY  . Drug use: No  . Sexual activity: Yes    Birth control/protection: Post-menopausal  Other Topics Concern  . Not on file  Social History Narrative   Lives close to [place]; with husband. Quit smoking in 2007 [25 years]; ocassional alcohol. 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    Social Determinants of Health   Financial Resource Strain: Not on file  Food Insecurity: Not on file  Transportation Needs: Not on file  Physical Activity: Not on file  Stress: Not on file  Social Connections: Not on file  Intimate Partner Violence: Not on file    FAMILY HISTORY: Family History  Problem Relation Age of Onset  . Bladder Cancer Mother   . Diabetes Father   . Heart disease Father   . Bladder Cancer Brother   . Breast cancer Neg Hx     ALLERGIES:  is allergic to penicillins.  MEDICATIONS:  Current Outpatient Medications  Medication Sig Dispense Refill  . anastrozole (ARIMIDEX) 1 MG tablet Take 1 tablet (1 mg total) by mouth daily. 90 tablet 1  . B COMPLEX VITAMINS  PO Take 1 tablet by mouth daily.     . chlorhexidine (PERIDEX) 0.12 % solution Use as directed 15 mLs in the mouth or throat 2 (two) times daily. 240 mL 0  . Cholecalciferol (VITAMIN D) 125 MCG (5000 UT) CAPS Take 5,000 Units by mouth daily.     . COLLAGEN PO Take 1 tablet by mouth daily. Super Collagen w/Biotin    . ibuprofen (ADVIL) 200 MG tablet Take 600 mg by mouth every 8 (eight) hours as needed (for pain.).     Marland Kitchen lidocaine-prilocaine (EMLA) cream Apply 1 application topically as needed. 30 g 0  . methimazole (TAPAZOLE) 5 MG tablet Take 5 mg by mouth daily.     . Multiple Vitamin (MULTIVITAMIN WITH MINERALS) TABS tablet Take 1 tablet by mouth daily.    . Omega  3-6-9 Fatty Acids (OMEGA 3-6-9 COMPLEX) CAPS Take 1 capsule by mouth daily.    . ondansetron (ZOFRAN) 8 MG tablet One pill every 8 hours as needed for nausea/vomitting. 40 tablet 1  . prochlorperazine (COMPAZINE) 10 MG tablet Take 1 tablet (10 mg total) by mouth every 6 (six) hours as needed for nausea or vomiting. 40 tablet 1  . valACYclovir (VALTREX) 500 MG tablet TAKE ONE TABLET BY MOUTH DAILY 30 tablet 11   No current facility-administered medications for this visit.    PHYSICAL EXAMINATION: ECOG PERFORMANCE STATUS: 0 - Asymptomatic  Vitals:   03/31/20 0823  BP: 126/66  Pulse: 70  Resp: 16  Temp: (!) 96.9 F (36.1 C)  SpO2: 99%   Filed Weights   03/31/20 0823  Weight: 178 lb (80.7 kg)    Physical Exam HENT:     Head: Normocephalic and atraumatic.     Mouth/Throat:     Pharynx: No oropharyngeal exudate.  Eyes:     Pupils: Pupils are equal, round, and reactive to light.  Cardiovascular:     Rate and Rhythm: Normal rate and regular rhythm.  Pulmonary:     Effort: Pulmonary effort is normal. No respiratory distress.     Breath sounds: Normal breath sounds. No wheezing.  Abdominal:     General: Bowel sounds are normal. There is no distension.     Palpations: Abdomen is soft. There is no mass.     Tenderness: There is no abdominal tenderness. There is no guarding or rebound.  Musculoskeletal:        General: No tenderness. Normal range of motion.     Cervical back: Normal range of motion and neck supple.  Skin:    General: Skin is warm.  Neurological:     Mental Status: She is alert and oriented to person, place, and time.  Psychiatric:        Mood and Affect: Affect normal.    LABORATORY DATA:  I have reviewed the data as listed Lab Results  Component Value Date   WBC 4.9 03/31/2020   HGB 11.7 (L) 03/31/2020   HCT 36.1 03/31/2020   MCV 86.2 03/31/2020   PLT 197 03/31/2020   Recent Labs    11/20/19 0903 12/18/19 0840 01/01/20 0859 01/22/20 0905  02/19/20 0910 03/10/20 0922 03/31/20 0807  NA 139 143 139   < > 140 140 137  K 4.0 4.1 4.1   < > 3.9 3.9 3.8  CL 107 109 104   < > 106 105 104  CO2 '24 24 24   ' < > '24 23 22  ' GLUCOSE 98 105* 106*   < > 203* 216* 109*  BUN 22* 14 16   < > '14 14 13  ' CREATININE 0.83 0.72 0.81   < > 0.78 0.87 0.75  CALCIUM 9.6 9.0 9.5   < > 9.1 9.4 9.2  GFRNONAA >60 >60 >60   < > >60 >60 >60  GFRAA >60 >60 >60  --   --   --   --   PROT 6.7 6.6 7.1   < > 7.0 6.9 6.8  ALBUMIN 4.1 4.0 4.2   < > 3.8 4.0 4.2  AST 32 36 39   < > 43* 43* 45*  ALT 36 35 39   < > 46* 48* 49*  ALKPHOS 72 76 76   < > 82 78 76  BILITOT 0.8 0.5 0.8   < > 0.5 0.5 0.6   < > = values in this interval not displayed.    RADIOGRAPHIC STUDIES: I have personally reviewed the radiological images as listed and agreed with the findings in the report. DG Bone Density  Result Date: 03/30/2020 EXAM: DUAL X-RAY ABSORPTIOMETRY (DXA) FOR BONE MINERAL DENSITY IMPRESSION: Your patient Seren Chaloux completed a BMD test on 03/30/2020 using the [place] (software version: 14.10) manufactured by [employer]. The following summarizes the results of our evaluation. Technologist:VLM PATIENT BIOGRAPHICAL: Name: Samhitha, Rosen Patient ID: [id number] Birth Date: 1961-01-24 Height: 65.0 in. Gender: Female Exam Date: 03/30/2020 Weight: 175.0 lbs. Indications: Caucasian, History of Breast Cancer, History of Chemo, Vitamin D Deficiency Fractures: Treatments: Anastrozole, Calcium, Vitamin D DENSITOMETRY RESULTS: Site      Region    Measured Date Measured Age WHO Classification Young Adult T-score BMD         %Change vs. Previous Significant Change (*) AP Spine L1-L4 03/30/2020 57.2 Normal -0.8 1.090 g/cm2 - - DualFemur Neck Left 03/30/2020 57.2 Osteopenia -1.2 0.871 g/cm2 - - ASSESSMENT: The BMD measured at Femur Neck Left is 0.871 g/cm2 with a T-score of -1.2. This patient is considered osteopenic according to [facility])  criteria. The scan quality is good. World Pharmacologist [facility]) criteria for post-menopausal, Caucasian Women: Normal:                   T-score at or above -1 SD Osteopenia/low bone mass: T-score between -1 and -2.5 SD Osteoporosis:             T-score at or below -2.5 SD RECOMMENDATIONS: 1. All patients should optimize calcium and vitamin D intake. 2. Consider FDA-approved medical therapies in postmenopausal women and men aged 67 years and older, based on the following: a. A hip or vertebral(clinical or morphometric) fracture b. T-score < -2.5 at the femoral neck or spine after appropriate evaluation to exclude secondary causes c. Low bone mass (T-score between -1.0 and -2.5 at the femoral neck or spine) and a 10-year probability of a hip fracture > 3% or a 10-year probability of a major osteoporosis-related fracture > 20% based on the [place]-adapted WHO algorithm 3. Clinician judgment and/or patient preferences may indicate treatment for people with 10-year fracture probabilities above or below these levels FOLLOW-UP: People with diagnosed cases of osteoporosis or at high risk for fracture should have regular bone mineral density tests. For patients eligible for Medicare, routine testing is allowed once every 2 years. The testing frequency can be increased to one year for patients who have rapidly progressing disease, those who are receiving or discontinuing medical therapy to restore bone mass, or have additional risk factors. I have  reviewed this report, and agree with the above findings. [facility] Dear Cammie Sickle, Your patient BECKI MCCASKILL completed a FRAX assessment on 03/30/2020 using the [place] (analysis version: 14.10) manufactured by [employer]. The following summarizes the results of our evaluation. PATIENT BIOGRAPHICAL: Name: Laurin, Paulo Patient ID: [id number] Birth Date: 1960/11/24 Height:    65.0 in. Gender:     Female    [age]       Weight:     175.0 lbs. Ethnicity:  White                            Exam Date: 03/30/2020 FRAX* RESULTS:  (version: 3.5) 10-year Probability of Fracture1 Major Osteoporotic Fracture2 Hip Fracture 6.5% 0.4% Population: [place] (Caucasian) Risk Factors: None Based on Femur (Left) Neck BMD 1 -The 10-year probability of fracture may be lower than reported if the patient has received treatment. 2 -Major Osteoporotic Fracture: Clinical Spine, Forearm, Hip or Shoulder *FRAX is a Materials engineer of the [employer] for Metabolic Bone Disease, a [place] (WHO) [facility]. ASSESSMENT: The probability of a major osteoporotic fracture is 6.5 %within the next ten years. The probability of a hip fracture is 0.4 % within the next ten years. . Electronically Signed   By: Lowella Grip III M.D.   On: 03/30/2020 10:01   ASSESSMENT & PLAN:   Carcinoma of upper-outer quadrant of right breast in female, estrogen receptor positive ([place]) #Right breast-invasive mammary carcinoma- S/P LUMPECTOMY pT1a [72m];pN0 [STAGE IA- ER/PRpoS; Her 2 POS]; STAGE I. S/p Taxol-Herceptin; currently on Herceptin maintenance. On Anastrazole. STABLE.    # Proceed with maintenance herceptin today; Labs today reviewed;  acceptable for treatment today. 10/04- MUGA scan- 64; normal.   # Right chest wall rash/Shingles-resolved; continue on Valtrex 500 mg once a day [sec prophy]; STABLE.   # SWOG study/PN study- PN- G-1 STABLE;   # Fatty liver- on [place] AST/ALT -grade 1 slightly elevated;  STABLE;   Monitor for now.  # OSTEOPENIA -BMD [ dec 2021-T-score of -1.2].  Discussed the potential risk factors for osteoporosis- age/gender/postmenopausal status/use of anti-estrogen treatments. Discussed multiple options including exercise/ calcium and vitamin D supplementation/ and also use of bisphosphonates. Discussed oral bisphosphonates versus parenteral bisphosphonate like Reclast/ RANK ligand inhibitors-  desnosumab. Discussed the potential benefits and/side effects  Including but not limited to Osteonecrosis of jaw/ hypocalcemia. Also discussed the role of adjuvant zometa every 6 months.  Patient interested.  We'll start next visit.   # Disposition:  # Herceptin today #  follow up in 3weeks-X- MD;[Thursday] labs- cbc/cmp; Herceptin; zometa-   #  follow up in 6 weeks-X- MD;[Thursday] labs- cbc/cmp; Herceptin; -Dr.B   All questions were answered. The patient/family knows to call the clinic with any problems, questions or concerns.    GCammie Sickle MD 03/31/2020 9:14 PM

## 2020-03-31 NOTE — Progress Notes (Signed)
Wants to know if you can check cholesterol due to the side effects of anastrozole being high cholesterol. Wants a baseline.

## 2020-03-31 NOTE — Progress Notes (Signed)
1050- Patient tolerated treatment well. Patient stable and discharged to home at this time.

## 2020-04-06 DIAGNOSIS — E0500 Thyrotoxicosis with diffuse goiter without thyrotoxic crisis or storm: Secondary | ICD-10-CM | POA: Diagnosis not present

## 2020-04-13 DIAGNOSIS — E0500 Thyrotoxicosis with diffuse goiter without thyrotoxic crisis or storm: Secondary | ICD-10-CM | POA: Diagnosis not present

## 2020-04-21 ENCOUNTER — Ambulatory Visit: Payer: 59

## 2020-04-21 ENCOUNTER — Ambulatory Visit: Payer: 59 | Admitting: Oncology

## 2020-04-21 ENCOUNTER — Other Ambulatory Visit: Payer: 59

## 2020-04-23 ENCOUNTER — Inpatient Hospital Stay: Payer: 59

## 2020-04-23 ENCOUNTER — Other Ambulatory Visit: Payer: Self-pay

## 2020-04-23 ENCOUNTER — Inpatient Hospital Stay: Payer: 59 | Attending: Oncology

## 2020-04-23 ENCOUNTER — Encounter: Payer: Self-pay | Admitting: Oncology

## 2020-04-23 ENCOUNTER — Inpatient Hospital Stay (HOSPITAL_BASED_OUTPATIENT_CLINIC_OR_DEPARTMENT_OTHER): Payer: 59 | Admitting: Oncology

## 2020-04-23 VITALS — BP 130/72 | HR 67 | Temp 96.2°F | Resp 18 | Wt 179.2 lb

## 2020-04-23 DIAGNOSIS — Z5112 Encounter for antineoplastic immunotherapy: Secondary | ICD-10-CM | POA: Diagnosis not present

## 2020-04-23 DIAGNOSIS — Z170 Estrogen receptor positive status [ER+]: Secondary | ICD-10-CM | POA: Insufficient documentation

## 2020-04-23 DIAGNOSIS — C50411 Malignant neoplasm of upper-outer quadrant of right female breast: Secondary | ICD-10-CM

## 2020-04-23 DIAGNOSIS — M85852 Other specified disorders of bone density and structure, left thigh: Secondary | ICD-10-CM

## 2020-04-23 DIAGNOSIS — Z79811 Long term (current) use of aromatase inhibitors: Secondary | ICD-10-CM | POA: Diagnosis not present

## 2020-04-23 LAB — COMPREHENSIVE METABOLIC PANEL
ALT: 48 U/L — ABNORMAL HIGH (ref 0–44)
AST: 41 U/L (ref 15–41)
Albumin: 4.2 g/dL (ref 3.5–5.0)
Alkaline Phosphatase: 79 U/L (ref 38–126)
Anion gap: 10 (ref 5–15)
BUN: 17 mg/dL (ref 6–20)
CO2: 25 mmol/L (ref 22–32)
Calcium: 9.4 mg/dL (ref 8.9–10.3)
Chloride: 105 mmol/L (ref 98–111)
Creatinine, Ser: 0.76 mg/dL (ref 0.44–1.00)
GFR, Estimated: >60 mL/min (ref >60)
Glucose, Bld: 108 mg/dL — ABNORMAL HIGH (ref 70–99)
Potassium: 4.1 mmol/L (ref 3.5–5.1)
Sodium: 140 mmol/L (ref 135–145)
Total Bilirubin: 0.5 mg/dL (ref 0.3–1.2)
Total Protein: 6.9 g/dL (ref 6.5–8.1)

## 2020-04-23 LAB — CBC WITH DIFFERENTIAL/PLATELET
Abs Immature Granulocytes: 0.03 10*3/uL (ref 0.00–0.07)
Basophils Absolute: 0.1 10*3/uL (ref 0.0–0.1)
Basophils Relative: 3 %
Eosinophils Absolute: 0.2 10*3/uL (ref 0.0–0.5)
Eosinophils Relative: 5 %
HCT: 37.3 % (ref 36.0–46.0)
Hemoglobin: 11.9 g/dL — ABNORMAL LOW (ref 12.0–15.0)
Immature Granulocytes: 1 %
Lymphocytes Relative: 28 %
Lymphs Abs: 1.3 10*3/uL (ref 0.7–4.0)
MCH: 27.9 pg (ref 26.0–34.0)
MCHC: 31.9 g/dL (ref 30.0–36.0)
MCV: 87.6 fL (ref 80.0–100.0)
Monocytes Absolute: 0.5 10*3/uL (ref 0.1–1.0)
Monocytes Relative: 12 %
Neutro Abs: 2.5 10*3/uL (ref 1.7–7.7)
Neutrophils Relative %: 51 %
Platelets: 217 10*3/uL (ref 150–400)
RBC: 4.26 MIL/uL (ref 3.87–5.11)
RDW: 14.6 % (ref 11.5–15.5)
WBC: 4.7 10*3/uL (ref 4.0–10.5)
nRBC: 0 % (ref 0.0–0.2)

## 2020-04-23 MED ORDER — DIPHENHYDRAMINE HCL 25 MG PO CAPS
25.0000 mg | ORAL_CAPSULE | Freq: Once | ORAL | Status: AC
  Administered 2020-04-23: 25 mg via ORAL
  Filled 2020-04-23: qty 1

## 2020-04-23 MED ORDER — HEPARIN SOD (PORK) LOCK FLUSH 100 UNIT/ML IV SOLN
500.0000 [IU] | Freq: Once | INTRAVENOUS | Status: DC | PRN
  Filled 2020-04-23: qty 5

## 2020-04-23 MED ORDER — HEPARIN SOD (PORK) LOCK FLUSH 100 UNIT/ML IV SOLN
INTRAVENOUS | Status: AC
  Filled 2020-04-23: qty 5

## 2020-04-23 MED ORDER — LIDOCAINE-PRILOCAINE 2.5-2.5 % EX CREA: 1.0000 "application " | TOPICAL_CREAM | CUTANEOUS | 0 refills | Status: DC | PRN

## 2020-04-23 MED ORDER — SODIUM CHLORIDE 0.9% FLUSH
10.0000 mL | Freq: Once | INTRAVENOUS | Status: AC
  Administered 2020-04-23: 10 mL via INTRAVENOUS
  Filled 2020-04-23: qty 10

## 2020-04-23 MED ORDER — ACETAMINOPHEN 325 MG PO TABS
650.0000 mg | ORAL_TABLET | Freq: Once | ORAL | Status: AC
  Administered 2020-04-23: 650 mg via ORAL
  Filled 2020-04-23: qty 2

## 2020-04-23 MED ORDER — CHLORHEXIDINE GLUCONATE 0.12 % MT SOLN: 15.0000 mL | Freq: Two times a day (BID) | OROMUCOSAL | 0 refills | Status: DC

## 2020-04-23 MED ORDER — HEPARIN SOD (PORK) LOCK FLUSH 100 UNIT/ML IV SOLN
500.0000 [IU] | Freq: Once | INTRAVENOUS | Status: AC
  Administered 2020-04-23: 500 [IU] via INTRAVENOUS
  Filled 2020-04-23: qty 5

## 2020-04-23 MED ORDER — ZOLEDRONIC ACID 4 MG/100ML IV SOLN
4.0000 mg | Freq: Once | INTRAVENOUS | Status: AC
  Administered 2020-04-23: 4 mg via INTRAVENOUS
  Filled 2020-04-23: qty 100

## 2020-04-23 MED ORDER — SODIUM CHLORIDE 0.9 % IV SOLN
Freq: Once | INTRAVENOUS | Status: AC
  Filled 2020-04-23: qty 250

## 2020-04-23 MED ORDER — TRASTUZUMAB-ANNS CHEMO 150 MG IV SOLR
450.0000 mg | Freq: Once | INTRAVENOUS | Status: AC
  Administered 2020-04-23: 450 mg via INTRAVENOUS
  Filled 2020-04-23: qty 21.43

## 2020-04-23 NOTE — Progress Notes (Signed)
[facility] [place] NOTE  Patient Care Team: Birdie Sons, MD as PCP - General (Family Medicine) [facility] (Gynecology) Theodore Demark, RN as Oncology Nurse Navigator Cammie Sickle, MD as Consulting Physician (Hematology and Oncology)  CHIEF COMPLAINTS/PURPOSE OF CONSULTATION: Breast cancer  #  Oncology History Overview Note  # MARCH 2021- RUOQ- 32m- [BREAST, RIGHT, LATERAL POSTERIOR  - INVASIVE MAMMARY CARCINOMA, NO SPECIAL TYPE, ASSOCIATED WITH  CALCIFICATIONS.][place] ER-> 90%; PR- 50-90%; HER 2 FISH- POSITIVE  # S/P LUMPECTOMY [Dr.Cintron] pT1a [419m;pN0 [STAGE IA- ER/PRpoS; Her 2 POS]  . BREAST, RIGHT, LATERAL MIDDLE DEPTH (RIBBON-SHAPED CLIP):  STEREOTACTIC-GUIDED CORE BIOPSY: - DUCTAL CARCINOMA IN SITU (DCIS), HIGH-GRADE, ASSOCIATED WITH  CALCIFICATIONS.   # May 14th,2021- TH-H; s/p RT [finished OCT 2021]; NOV 23rd 2021- start Anastrazole  #Hypothyroidism-methimazole [Dr.Solum]  # SURVIVORSHIP:   # GENETICS:   DIAGNOSIS: Breast cancer  STAGE:   1      ;  GOALS: Cure  CURRENT/MOST RECENT THERAPY : Taxol Herceptin   Carcinoma of upper-outer quadrant of right breast in female, estrogen receptor positive ([facility] 07/[facility] Initial Diagnosis   Carcinoma of upper-outer quadrant of right breast in female, estrogen receptor positive ([facility]  08/28/2019 -  Chemotherapy   The patient had PACLitaxel (TAXOL) 156 mg in sodium chloride 0.9 % 250 mL chemo infusion (</= 8039m2), 80 mg/m2 = 156 mg, Intravenous,  Once, 3 of 3 cycles Administration: 156 mg (08/28/2019), 156 mg (09/04/2019), 156 mg (09/25/2019), 156 mg (09/11/2019), 156 mg (09/18/2019), 156 mg (10/02/2019), 156 mg (10/09/2019), 156 mg (10/16/2019), 156 mg (10/23/2019), 156 mg (10/30/2019), 156 mg (11/06/2019), 156 mg (11/14/2019) trastuzumab-anns (KANJINTI) 336 mg in sodium chloride 0.9 % 250 mL chemo infusion, 4 mg/kg = 336 mg (100 % of original dose 4 mg/kg), Intravenous,  Once, 9 of 16 cycles Dose  modification: 4 mg/kg (original dose 4 mg/kg, Cycle 1, Reason: Other (see comments), Comment: loading dose, biosimilar per insurance), 8 mg/kg (original dose 2 mg/kg, Cycle 7, Reason: Other (see comments), Comment: reload (>1week )), 6 mg/kg (original dose 2 mg/kg, Cycle 8, Reason: Provider Judgment, Comment: for every 3 week dosing) Administration: 336 mg (08/28/2019), 150 mg (09/04/2019), 150 mg (09/25/2019), 150 mg (11/20/2019), 150 mg (01/01/2020), 150 mg (09/11/2019), 150 mg (09/18/2019), 150 mg (10/02/2019), 150 mg (10/09/2019), 150 mg (10/16/2019), 150 mg (10/23/2019), 150 mg (10/30/2019), 150 mg (11/06/2019), 150 mg (11/14/2019), 150 mg (01/22/2020), 651 mg (02/19/2020), 450 mg (03/10/2020), 450 mg (03/31/2020)  for chemotherapy treatment.      HISTORY OF PRESENTING ILLNESS:  [name] [age]o.  female with breast cancer early stage-ER/PR positive HER-2/neu positive on adjuvant  Herceptin maintenance is here for follow-up.  Patient follows up with Dr.Brahmanday who is off today, I am covering him to see this patient. Patient reports tolerating Arimidex 1 mg daily.  Manageable side effects. Denies any breast concerns. Appetite is good. Mild tingling, stable symptoms.  No nausea vomiting diarrhea, chest pain, leg swelling  Review of Systems  Constitutional: Positive for malaise/fatigue. Negative for chills, diaphoresis, fever and weight loss.  HENT: Negative for nosebleeds and sore throat.   Eyes: Negative for double vision.  Respiratory: Negative for cough, hemoptysis, sputum production, shortness of breath and wheezing.   Cardiovascular: Negative for chest pain, palpitations, orthopnea and leg swelling.  Gastrointestinal: Negative for abdominal pain, blood in stool, constipation, diarrhea, heartburn, melena, nausea and vomiting.  Genitourinary: Negative for dysuria, frequency and urgency.  Musculoskeletal: Negative for back pain and  joint pain.  Skin: Negative for itching.  Neurological: Positive for  tingling. Negative for dizziness, focal weakness, weakness and headaches.  Endo/Heme/Allergies: Does not bruise/bleed easily.  Psychiatric/Behavioral: Negative for depression. The patient is not nervous/anxious and does not have insomnia.      MEDICAL HISTORY:  Past Medical History:  Diagnosis Date  . Atypical mole 09/26/2018   right mid anterior side/mild  . Basal cell carcinoma 08/25/2014   right med cheek infraorbital  . Breast cancer ([place]) 07/2019   [place] and DCIS  . Dyslipidemia   . Family history of bladder cancer   . Graves disease   . Hyperthyroidism   . Inborn lipid storage disorder   . Leiomyoma of uterus   . Vitamin D deficiency     SURGICAL HISTORY: Past Surgical History:  Procedure Laterality Date  . APPENDECTOMY  1982   Cyst removed from ovaries   . BREAST BIOPSY Right 07/05/2019   stereo bx, coil clip, [facility]  . BREAST BIOPSY Right 07/05/2019   stereo bx, ribbon clip, DCIS   . BREAST LUMPECTOMY Right 07/31/2019    [facility] and DCIS with SN bx  . COLONOSCOPY WITH PROPOFOL N/A 01/20/2017   Procedure: COLONOSCOPY WITH PROPOFOL;  Surgeon: Jonathon Bellows, MD;  Location: [facility] ENDOSCOPY;  Service: Gastroenterology;  Laterality: N/A;  . [place]  . NECK SURGERY  2013   LIft from weight loss  . PARTIAL MASTECTOMY WITH NEEDLE LOCALIZATION AND AXILLARY SENTINEL LYMPH NODE BX Right 07/31/2019   Procedure: RIGHT PARTIAL MASTECTOMY WITH BRACKETED NEEDLE LOCALIZATION AND LEFT AXILLARY SENTINEL LYMPH NODE BIOPSY;  Surgeon: Herbert Pun, MD;  Location: [facility] ORS;  Service: General;  Laterality: Right;  . PORTACATH PLACEMENT Left 07/31/2019   Procedure: INSERTION PORT-A-CATH LEFT INTERNAL JUGULAR;  Surgeon: Herbert Pun, MD;  Location: [facility] ORS;  Service: General;  Laterality: Left;  . TONSILLECTOMY  1969    SOCIAL HISTORY: Social History   Socioeconomic History  . Marital status: Married    Spouse name: Not on file  . Number of children:  0  . Years of education: Anselm Lis  . Highest education level: Not on file  Occupational History  . Occupation: Full-Time    Comment: [employer] [place] x9  Tobacco Use  . Smoking status: Former Research scientist (life sciences)  . Smokeless tobacco: Never Used  Vaping Use  . Vaping Use: Never used  Substance and Sexual Activity  . Alcohol use: Yes    Alcohol/week: 0.0 standard drinks    Comment: RARELY  . Drug use: No  . Sexual activity: Yes    Birth control/protection: Post-menopausal  Other Topics Concern  . Not on file  Social History Narrative   Lives close to [place]; with husband. Quit smoking in 2007 [25 years]; ocassional alcohol. 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    Social Determinants of Health   Financial Resource Strain: Not on file  Food Insecurity: Not on file  Transportation Needs: Not on file  Physical Activity: Not on file  Stress: Not on file  Social Connections: Not on file  Intimate Partner Violence: Not on file    FAMILY HISTORY: Family History  Problem Relation Age of Onset  . Bladder Cancer Mother   . Diabetes Father   . Heart disease Father   . Bladder Cancer Brother   . Breast cancer Neg Hx     ALLERGIES:  is allergic to penicillins.  MEDICATIONS:  Current Outpatient Medications  Medication Sig Dispense Refill  . anastrozole (ARIMIDEX) 1  MG tablet Take 1 tablet (1 mg total) by mouth daily. 90 tablet 1  . B COMPLEX VITAMINS PO Take 1 tablet by mouth daily.     . chlorhexidine (PERIDEX) 0.12 % solution Use as directed 15 mLs in the mouth or throat 2 (two) times daily. 240 mL 0  . Cholecalciferol (VITAMIN D) 125 MCG (5000 UT) CAPS Take 5,000 Units by mouth daily.     . COLLAGEN PO Take 1 tablet by mouth daily. Super Collagen w/Biotin    . ibuprofen (ADVIL) 200 MG tablet Take 600 mg by mouth every 8 (eight) hours as needed (for pain.).     Marland Kitchen lidocaine-prilocaine (EMLA) cream Apply 1 application topically as needed. 30 g 0  . methimazole (TAPAZOLE) 5 MG tablet Take 5 mg  by mouth daily.     . Multiple Vitamin (MULTIVITAMIN WITH MINERALS) TABS tablet Take 1 tablet by mouth daily.    . Omega 3-6-9 Fatty Acids (OMEGA 3-6-9 COMPLEX) CAPS Take 1 capsule by mouth daily.    . ondansetron (ZOFRAN) 8 MG tablet One pill every 8 hours as needed for nausea/vomitting. 40 tablet 1  . prochlorperazine (COMPAZINE) 10 MG tablet Take 1 tablet (10 mg total) by mouth every 6 (six) hours as needed for nausea or vomiting. 40 tablet 1  . valACYclovir (VALTREX) 500 MG tablet TAKE ONE TABLET BY MOUTH DAILY 30 tablet 11   No current facility-administered medications for this visit.   Facility-Administered Medications Ordered in Other Visits  Medication Dose Route Frequency Provider Last Rate Last Admin  . heparin lock flush 100 unit/mL  500 Units Intravenous Once Cammie Sickle, MD        PHYSICAL EXAMINATION: ECOG PERFORMANCE STATUS: 0 - Asymptomatic  Vitals:   04/23/20 0847  BP: 130/72  Pulse: 67  Resp: 18  Temp: (!) 96.2 F (35.7 C)   Filed Weights   04/23/20 0847  Weight: 179 lb 3.2 oz (81.3 kg)    Physical Exam HENT:     Head: Normocephalic and atraumatic.     Mouth/Throat:     Pharynx: No oropharyngeal exudate.  Eyes:     Pupils: Pupils are equal, round, and reactive to light.  Cardiovascular:     Rate and Rhythm: Normal rate and regular rhythm.  Pulmonary:     Effort: Pulmonary effort is normal. No respiratory distress.     Breath sounds: Normal breath sounds. No wheezing.  Abdominal:     General: Bowel sounds are normal. There is no distension.     Palpations: Abdomen is soft. There is no mass.     Tenderness: There is no abdominal tenderness. There is no guarding or rebound.  Musculoskeletal:        General: No tenderness. Normal range of motion.     Cervical back: Normal range of motion and neck supple.  Skin:    General: Skin is warm.  Neurological:     Mental Status: She is alert and oriented to person, place, and time.  Psychiatric:         Mood and Affect: Affect normal.    LABORATORY DATA:  I have reviewed the data as listed Lab Results  Component Value Date   WBC 4.9 03/31/2020   HGB 11.7 (L) 03/31/2020   HCT 36.1 03/31/2020   MCV 86.2 03/31/2020   PLT 197 03/31/2020   Recent Labs    11/20/19 0903 12/18/19 0840 01/01/20 0859 01/22/20 0905 02/19/20 0910 03/10/20 0922 03/31/20 0807  NA 139 143  139   < > 140 140 137  K 4.0 4.1 4.1   < > 3.9 3.9 3.8  CL 107 109 104   < > 106 105 104  CO2 '24 24 24   ' < > '24 23 22  ' GLUCOSE 98 105* 106*   < > 203* 216* 109*  BUN 22* 14 16   < > '14 14 13  ' CREATININE 0.83 0.72 0.81   < > 0.78 0.87 0.75  CALCIUM 9.6 9.0 9.5   < > 9.1 9.4 9.2  GFRNONAA >60 >60 >60   < > >60 >60 >60  GFRAA >60 >60 >60  --   --   --   --   PROT 6.7 6.6 7.1   < > 7.0 6.9 6.8  ALBUMIN 4.1 4.0 4.2   < > 3.8 4.0 4.2  AST 32 36 39   < > 43* 43* 45*  ALT 36 35 39   < > 46* 48* 49*  ALKPHOS 72 76 76   < > 82 78 76  BILITOT 0.8 0.5 0.8   < > 0.5 0.5 0.6   < > = values in this interval not displayed.    RADIOGRAPHIC STUDIES: I have personally reviewed the radiological images as listed and agreed with the findings in the report. DG Bone Density  Result Date: 03/30/2020 EXAM: DUAL X-RAY ABSORPTIOMETRY (DXA) FOR BONE MINERAL DENSITY IMPRESSION: Your patient Connie Bradford completed a BMD test on 03/30/2020 using the [place] (software version: 14.10) manufactured by [employer]. The following summarizes the results of our evaluation. Technologist:VLM PATIENT BIOGRAPHICAL: Name: Ameenah, Prosser Patient ID: [id number] Birth Date: 01-01-1961 Height: 65.0 in. Gender: Female Exam Date: 03/30/2020 Weight: 175.0 lbs. Indications: Caucasian, History of Breast Cancer, History of Chemo, Vitamin D Deficiency Fractures: Treatments: Anastrozole, Calcium, Vitamin D DENSITOMETRY RESULTS: Site      Region    Measured Date Measured Age WHO Classification Young Adult T-score BMD         %Change vs.  Previous Significant Change (*) AP Spine L1-L4 03/30/2020 57.2 Normal -0.8 1.090 g/cm2 - - DualFemur Neck Left 03/30/2020 57.2 Osteopenia -1.2 0.871 g/cm2 - - ASSESSMENT: The BMD measured at Femur Neck Left is 0.871 g/cm2 with a T-score of -1.2. This patient is considered osteopenic according to [facility]) criteria. The scan quality is good. World Pharmacologist [facility]) criteria for post-menopausal, Caucasian Women: Normal:                   T-score at or above -1 SD Osteopenia/low bone mass: T-score between -1 and -2.5 SD Osteoporosis:             T-score at or below -2.5 SD RECOMMENDATIONS: 1. All patients should optimize calcium and vitamin D intake. 2. Consider FDA-approved medical therapies in postmenopausal women and men aged 84 years and older, based on the following: a. A hip or vertebral(clinical or morphometric) fracture b. T-score < -2.5 at the femoral neck or spine after appropriate evaluation to exclude secondary causes c. Low bone mass (T-score between -1.0 and -2.5 at the femoral neck or spine) and a 10-year probability of a hip fracture > 3% or a 10-year probability of a major osteoporosis-related fracture > 20% based on the [place]-adapted WHO algorithm 3. Clinician judgment and/or patient preferences may indicate treatment for people with 10-year fracture probabilities above or below these levels FOLLOW-UP: People with diagnosed cases of osteoporosis or at high risk  for fracture should have regular bone mineral density tests. For patients eligible for Medicare, routine testing is allowed once every 2 years. The testing frequency can be increased to one year for patients who have rapidly progressing disease, those who are receiving or discontinuing medical therapy to restore bone mass, or have additional risk factors. I have reviewed this report, and agree with the above findings. [facility] Dear Cammie Sickle, Your patient RANYA FIDDLER completed a FRAX  assessment on 03/30/2020 using the [place] (analysis version: 14.10) manufactured by [employer]. The following summarizes the results of our evaluation. PATIENT BIOGRAPHICAL: Name: Connie Bradford, Connie Bradford Patient ID: [id number] Birth Date: 1961/04/18 Height:    65.0 in. Gender:     [age]    [age]       Weight:    175.0 lbs. Ethnicity:  White                            Exam Date: 03/30/2020 FRAX* RESULTS:  (version: 3.5) 10-year Probability of Fracture1 Major Osteoporotic Fracture2 Hip Fracture 6.5% 0.4% Population: [place] (Caucasian) Risk Factors: None Based on Femur (Left) Neck BMD 1 -The 10-year probability of fracture may be lower than reported if the patient has received treatment. 2 -Major Osteoporotic Fracture: Clinical Spine, Forearm, Hip or Shoulder *FRAX is a Materials engineer of the [employer] for Metabolic Bone Disease, a [place] (WHO) [facility]. ASSESSMENT: The probability of a major osteoporotic fracture is 6.5 %within the next ten years. The probability of a hip fracture is 0.4 % within the next ten years. . Electronically Signed   By: Lowella Grip III M.D.   On: 03/30/2020 10:01   ASSESSMENT & PLAN:  1. Carcinoma of upper-outer quadrant of right breast in female, estrogen receptor positive ([place])   2. Osteopenia of neck of left femur   3. Aromatase inhibitor use   4. Port-A-Cath in place    Right breast-invasive mammary carcinoma- S/P LUMPECTOMY pT1a [44m];pN0 [STAGE IA- ER/PRpoS; Her 2 POS]; STAGE I. S/p Taxol-Herceptin; currently on Herceptin maintenance.  And antiestrogen treatment. Last MUGA 01/20/20 normal LVEF, -defer to Dr. B for next MUGA testing. Labs are reviewed and discussed with patient. Proceed with Herceptin maintenance today. Continue Arimidex  SWOG study/PN study-peripheral neuropathy-grade 1, STABLE Fatty liver- on [place]-stable AST/ALT -grade 1 continue to monitor. Osteopenia, per note, Dr.  BRogue Bussinghas discussed with patient about the rationale and potential side effects of Zometa.  He has also discussed about the rationale for adjuvant Zometa.  Patient agrees with the treatment.  Patient waives dental clearance and will proceed with Zometa today.  Recommend patient to take calcium and vitamin D supplementation. Follow-up in 3 weeks with Dr. BRogue Bussinglab MD Herceptin All questions were answered. The patient/family knows to call the clinic with any problems, questions or concerns.    ZEarlie Server MD 04/23/2020 8:19 AM

## 2020-04-23 NOTE — Progress Notes (Signed)
Patient here for follow up and tx. No new concerns voiced. Pt is requesting refill on lidocaine cream and peridex mouthwash.

## 2020-05-14 ENCOUNTER — Inpatient Hospital Stay: Payer: 59

## 2020-05-14 ENCOUNTER — Inpatient Hospital Stay (HOSPITAL_BASED_OUTPATIENT_CLINIC_OR_DEPARTMENT_OTHER): Payer: 59 | Admitting: Internal Medicine

## 2020-05-14 ENCOUNTER — Encounter: Payer: Self-pay | Admitting: Internal Medicine

## 2020-05-14 ENCOUNTER — Other Ambulatory Visit: Payer: Self-pay

## 2020-05-14 VITALS — BP 128/63 | HR 77 | Temp 97.3°F | Resp 16 | Ht 67.0 in | Wt 179.0 lb

## 2020-05-14 DIAGNOSIS — C50411 Malignant neoplasm of upper-outer quadrant of right female breast: Secondary | ICD-10-CM

## 2020-05-14 DIAGNOSIS — Z5112 Encounter for antineoplastic immunotherapy: Secondary | ICD-10-CM | POA: Diagnosis not present

## 2020-05-14 DIAGNOSIS — Z170 Estrogen receptor positive status [ER+]: Secondary | ICD-10-CM | POA: Diagnosis not present

## 2020-05-14 LAB — CBC WITH DIFFERENTIAL/PLATELET
Abs Immature Granulocytes: 0.01 10*3/uL (ref 0.00–0.07)
Basophils Absolute: 0.1 10*3/uL (ref 0.0–0.1)
Basophils Relative: 2 %
Eosinophils Absolute: 0.2 10*3/uL (ref 0.0–0.5)
Eosinophils Relative: 4 %
HCT: 37 % (ref 36.0–46.0)
Hemoglobin: 12 g/dL (ref 12.0–15.0)
Immature Granulocytes: 0 %
Lymphocytes Relative: 23 %
Lymphs Abs: 1.2 10*3/uL (ref 0.7–4.0)
MCH: 28.6 pg (ref 26.0–34.0)
MCHC: 32.4 g/dL (ref 30.0–36.0)
MCV: 88.1 fL (ref 80.0–100.0)
Monocytes Absolute: 0.6 10*3/uL (ref 0.1–1.0)
Monocytes Relative: 11 %
Neutro Abs: 3.1 10*3/uL (ref 1.7–7.7)
Neutrophils Relative %: 60 %
Platelets: 221 10*3/uL (ref 150–400)
RBC: 4.2 MIL/uL (ref 3.87–5.11)
RDW: 14.2 % (ref 11.5–15.5)
WBC: 5.2 10*3/uL (ref 4.0–10.5)
nRBC: 0 % (ref 0.0–0.2)

## 2020-05-14 LAB — COMPREHENSIVE METABOLIC PANEL
ALT: 44 U/L (ref 0–44)
AST: 39 U/L (ref 15–41)
Albumin: 4.2 g/dL (ref 3.5–5.0)
Alkaline Phosphatase: 70 U/L (ref 38–126)
Anion gap: 8 (ref 5–15)
BUN: 17 mg/dL (ref 6–20)
CO2: 26 mmol/L (ref 22–32)
Calcium: 9.4 mg/dL (ref 8.9–10.3)
Chloride: 105 mmol/L (ref 98–111)
Creatinine, Ser: 0.85 mg/dL (ref 0.44–1.00)
GFR, Estimated: >60 mL/min (ref >60)
Glucose, Bld: 128 mg/dL — ABNORMAL HIGH (ref 70–99)
Potassium: 3.9 mmol/L (ref 3.5–5.1)
Sodium: 139 mmol/L (ref 135–145)
Total Bilirubin: 0.7 mg/dL (ref 0.3–1.2)
Total Protein: 6.8 g/dL (ref 6.5–8.1)

## 2020-05-14 MED ORDER — HEPARIN SOD (PORK) LOCK FLUSH 100 UNIT/ML IV SOLN
500.0000 [IU] | Freq: Once | INTRAVENOUS | Status: AC | PRN
  Administered 2020-05-14: 500 [IU]
  Filled 2020-05-14: qty 5

## 2020-05-14 MED ORDER — SODIUM CHLORIDE 0.9% FLUSH
10.0000 mL | Freq: Once | INTRAVENOUS | Status: AC
  Administered 2020-05-14: 10 mL via INTRAVENOUS
  Filled 2020-05-14: qty 10

## 2020-05-14 MED ORDER — SODIUM CHLORIDE 0.9 % IV SOLN
Freq: Once | INTRAVENOUS | Status: AC
  Filled 2020-05-14: qty 250

## 2020-05-14 MED ORDER — DIPHENHYDRAMINE HCL 25 MG PO CAPS
25.0000 mg | ORAL_CAPSULE | Freq: Once | ORAL | Status: AC
  Administered 2020-05-14: 25 mg via ORAL
  Filled 2020-05-14: qty 1

## 2020-05-14 MED ORDER — ACETAMINOPHEN 325 MG PO TABS
650.0000 mg | ORAL_TABLET | Freq: Once | ORAL | Status: AC
  Administered 2020-05-14: 650 mg via ORAL
  Filled 2020-05-14: qty 2

## 2020-05-14 MED ORDER — HEPARIN SOD (PORK) LOCK FLUSH 100 UNIT/ML IV SOLN
INTRAVENOUS | Status: AC
  Filled 2020-05-14: qty 5

## 2020-05-14 MED ORDER — TRASTUZUMAB-ANNS CHEMO 150 MG IV SOLR
450.0000 mg | Freq: Once | INTRAVENOUS | Status: AC
  Administered 2020-05-14: 450 mg via INTRAVENOUS
  Filled 2020-05-14: qty 21.43

## 2020-05-14 NOTE — Assessment & Plan Note (Addendum)
#  Right breast-invasive mammary carcinoma- S/P LUMPECTOMY pT1a [10mm];pN0 [STAGE IA- ER/PRpoS; Her 2 POS]; STAGE I. S/p Taxol-Herceptin; currently on Herceptin maintenance. On Anastrazole. STABLE.   # Proceed with maintenance herceptin today; Labs today reviewed;  acceptable for treatment today. 10/04- MUGA scan- 64; will repeat in 3 weeks.  Discussed that she will get Herceptin until May 2021.  # Right chest wall rash/Shingles-resolved; continue on Valtrex 500 mg once a day [sec prophy]; STABLE.   # SWOG study/PN study- PN- G-1 STABLE.   # Fatty liver- on [place]- AST/ALT -grade 1 slightly elevated;  STABLE;   Monitor for now.  # OSTEOPENIA -BMD [ dec 2021-T-score of -1.2].  - on adjuvant zometa q 60 M [dec 2021]   # Disposition:  # Herceptin today #  follow up in 3weeks- MD;[Thursday] labs- cbc/cmp; Herceptin; MUGA scan prior-Dr.B

## 2020-05-14 NOTE — Progress Notes (Signed)
Pt tolerated treatment well with no signs of complications. VSS. Pt stable for discharge.   Nathan Moctezuma  

## 2020-05-14 NOTE — Progress Notes (Signed)
Just started working out and is having some soreness under right armpit area where she had surgery last April. Wanted to know if that is normal

## 2020-05-14 NOTE — Progress Notes (Signed)
one [place] NOTE  Patient Care Team: Birdie Sons, MD as PCP - General (Family Medicine) [facility] Gyn (Gynecology) Theodore Demark, RN as Oncology Nurse Navigator Cammie Sickle, MD as Consulting Physician (Hematology and Oncology)  CHIEF COMPLAINTS/PURPOSE OF CONSULTATION: Breast cancer  #  Oncology History Overview Note  # MARCH 2021- RUOQ- 52m- [BREAST, RIGHT, LATERAL POSTERIOR  - INVASIVE MAMMARY CARCINOMA, NO SPECIAL TYPE, ASSOCIATED WITH  CALCIFICATIONS.][place] ER-> 90%; PR- 50-90%; HER 2 FISH- POSITIVE  # S/P LUMPECTOMY [Dr.Cintron] pT1a [431m;pN0 [STAGE IA- ER/PRpoS; Her 2 POS]  . BREAST, RIGHT, LATERAL MIDDLE DEPTH (RIBBON-SHAPED CLIP):  STEREOTACTIC-GUIDED CORE BIOPSY: - DUCTAL CARCINOMA IN SITU (DCIS), HIGH-GRADE, ASSOCIATED WITH  CALCIFICATIONS.   # May 14th,2021- TH-H; s/p RT [finished OCT 2021]; NOV 23rd 2021- start Anastrazole  #Hypothyroidism-methimazole [Dr.Solum]  # SURVIVORSHIP:   # GENETICS:   DIAGNOSIS: Breast cancer  STAGE:   1      ;  GOALS: Cure  CURRENT/MOST RECENT THERAPY : Taxol Herceptin   Carcinoma of upper-outer quadrant of right breast in female, estrogen receptor positive ([facility] [place] 07/15/2019 Initial Diagnosis   Carcinoma of upper-outer quadrant of right breast in female, estrogen receptor positive ([place]  08/28/2019 -  Chemotherapy    Patient is on Treatment Plan: BREAST WEEKLY PACLITAXEL / TRASTUZUMAB / MAINTENANCE TRASTUZUMAB EVERY 21 DAYS        HISTORY OF PRESENTING ILLNESS:  [name] with breast cancer early stage-ER/PR positive HER-2/neu positive on adjuvant anastrozole/Herceptin maintenance is here for follow-up.  Patient denies any worsening tingling numbness.  In fact tingling and numbness is getting better.  Denies any joint pains or body aches.  No nausea no vomiting no headaches.  No diarrhea.  Review of Systems  Constitutional: Positive for malaise/fatigue. Negative for  chills, diaphoresis, fever and weight loss.  HENT: Negative for nosebleeds and sore throat.   Eyes: Negative for double vision.  Respiratory: Negative for cough, hemoptysis, sputum production, shortness of breath and wheezing.   Cardiovascular: Negative for chest pain, palpitations, orthopnea and leg swelling.  Gastrointestinal: Negative for abdominal pain, blood in stool, constipation, diarrhea, heartburn, melena, nausea and vomiting.  Genitourinary: Negative for dysuria, frequency and urgency.  Musculoskeletal: Negative for back pain and joint pain.  Skin: Negative for itching.  Neurological: Positive for tingling. Negative for dizziness, focal weakness, weakness and headaches.  Endo/Heme/Allergies: Does not bruise/bleed easily.  Psychiatric/Behavioral: Negative for depression. The patient is not nervous/anxious and does not have insomnia.      MEDICAL HISTORY:  Past Medical History:  Diagnosis Date  . Atypical mole 09/26/2018   right mid anterior side/mild  . Basal cell carcinoma 08/25/2014   right med cheek infraorbital  . Breast cancer ([facility] [place]   [facility] DC[facility]  . Dyslipidemia   . Family history of bladder cancer   . Graves disease   . Hyperthyroidism   . Inborn lipid storage disorder   . Leiomyoma of uterus   . Vitamin D deficiency     SURGICAL HISTORY: Past Surgical History:  Procedure Laterality Date  . APPENDECTOMY  1982   Cyst removed from ovaries   . BREAST BIOPSY Right 07/05/2019   stereo bx, coil clip, IMC  . BREAST BIOPSY Right 07/05/2019   stereo bx, ribbon clip, DCIS   . BREAST LUMPECTOMY Right 07/31/2019    [facility]  . COLONOSCOPY WITH PROPOFOL N/A 01/20/2017   Procedure: COLONOSCOPY WITH PROPOFOL;  Surgeon: Jonathon Bellows, MD;  Location[facility] ENDOSCOPY;  Service: Gastroenterology;  Laterality: N/A;  . [place]  . NECK SURGERY  2013   LIft from weight loss  . PARTIAL MASTECTOMY WITH NEEDLE LOCALIZATION AND  AXILLARY SENTINEL LYMPH NODE BX Right 07/31/2019   Procedure: RIGHT PARTIAL MASTECTOMY WITH BRACKETED NEEDLE LOCALIZATION AND LEFT AXILLARY SENTINEL LYMPH NODE BIOPSY;  Surgeon: Herbert Pun, MD;  Location: [facility] ORS;  Service: General;  Laterality: Right;  . PORTACATH PLACEMENT Left 07/31/2019   Procedure: INSERTION PORT-A-CATH LEFT INTERNAL JUGULAR;  Surgeon: Herbert Pun, MD;  Location: [facility] ORS;  Service: General;  Laterality: Left;  . TONSILLECTOMY  1969    SOCIAL HISTORY: Social History   Socioeconomic History  . Marital status: Married    Spouse name: Not on file  . Number of children: 0  . Years of education: Anselm Lis  . Highest education level: Not on file  Occupational History  . Occupation: Full-Time    Comment: [employer] x9  Tobacco Use  . Smoking status: Former Research scientist (life sciences)  . Smokeless tobacco: Never Used  Vaping Use  . Vaping Use: Never used  Substance and Sexual Activity  . Alcohol use: Yes    Alcohol/week: 0.0 standard drinks    Comment: RARELY  . Drug use: No  . Sexual activity: Yes    Birth control/protection: Post-menopausal  Other Topics Concern  . Not on file  Social History Narrative   Lives close to [place]; with husband. Quit smoking in 2007 [25 years]; ocassional alcohol. 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    Social Determinants of Health   Financial Resource Strain: Not on file  Food Insecurity: Not on file  Transportation Needs: Not on file  Physical Activity: Not on file  Stress: Not on file  Social Connections: Not on file  Intimate Partner Violence: Not on file    FAMILY HISTORY: Family History  Problem Relation Age of Onset  . Bladder Cancer Mother   . Diabetes Father   . Heart disease Father   . Bladder Cancer Brother   . Breast cancer Neg Hx     ALLERGIES:  is allergic to penicillins.  MEDICATIONS:  Current Outpatient Medications  Medication Sig Dispense Refill  . anastrozole (ARIMIDEX) 1 MG tablet Take 1  tablet (1 mg total) by mouth daily. 90 tablet 1  . B COMPLEX VITAMINS PO Take 1 tablet by mouth daily.     . chlorhexidine (PERIDEX) 0.12 % solution Use as directed 15 mLs in the mouth or throat 2 (two) times daily. 240 mL 0  . Cholecalciferol (VITAMIN D) 125 MCG (5000 UT) CAPS Take 5,000 Units by mouth daily.     . COLLAGEN PO Take 1 tablet by mouth daily. Super Collagen w/Biotin    . ibuprofen (ADVIL) 200 MG tablet Take 600 mg by mouth every 8 (eight) hours as needed (for pain.).     Marland Kitchen lidocaine-prilocaine (EMLA) cream Apply 1 application topically as needed. 30 g 0  . methimazole (TAPAZOLE) 5 MG tablet Take 5 mg by mouth daily.     . Multiple Vitamin (MULTIVITAMIN WITH MINERALS) TABS tablet Take 1 tablet by mouth daily.    . Omega 3-6-9 Fatty Acids (OMEGA 3-6-9 COMPLEX) CAPS Take 1 capsule by mouth daily.    . ondansetron (ZOFRAN) 8 MG tablet One pill every 8 hours as needed for nausea/vomitting. 40 tablet 1  . prochlorperazine (COMPAZINE) 10 MG tablet Take 1 tablet (10 mg total) by mouth  every 6 (six) hours as needed for nausea or vomiting. 40 tablet 1  . valACYclovir (VALTREX) 500 MG tablet TAKE ONE TABLET BY MOUTH DAILY 30 tablet 11   No current facility-administered medications for this visit.    PHYSICAL EXAMINATION: ECOG PERFORMANCE STATUS: 0 - Asymptomatic  Vitals:   05/14/20 0930  BP: 128/63  Pulse: 77  Resp: 16  Temp: (!) 97.3 F (36.3 C)  SpO2: 100%   Filed Weights   05/14/20 0930  Weight: 179 lb (81.2 kg)    Physical Exam HENT:     Head: Normocephalic and atraumatic.     Mouth/Throat:     Pharynx: No oropharyngeal exudate.  Eyes:     Pupils: Pupils are equal, round, and reactive to light.  Cardiovascular:     Rate and Rhythm: Normal rate and regular rhythm.  Pulmonary:     Effort: Pulmonary effort is normal. No respiratory distress.     Breath sounds: Normal breath sounds. No wheezing.  Abdominal:     General: Bowel sounds are normal. There is no  distension.     Palpations: Abdomen is soft. There is no mass.     Tenderness: There is no abdominal tenderness. There is no guarding or rebound.  Musculoskeletal:        General: No tenderness. Normal range of motion.     Cervical back: Normal range of motion and neck supple.  Skin:    General: Skin is warm.  Neurological:     Mental Status: She is alert and oriented to person, place, and time.  Psychiatric:        Mood and Affect: Affect normal.    LABORATORY DATA:  I have reviewed the data as listed Lab Results  Component Value Date   WBC 5.2 05/14/2020   HGB 12.0 05/14/2020   HCT 37.0 05/14/2020   MCV 88.1 05/14/2020   PLT 221 05/14/2020   Recent Labs    11/20/19 0903 12/18/19 0840 01/01/20 0859 01/22/20 0905 03/31/20 0807 04/23/20 0827 05/14/20 0907  NA 139 143 139   < > 137 140 139  K 4.0 4.1 4.1   < > 3.8 4.1 3.9  CL 107 109 104   < > 104 105 105  CO2 '24 24 24   ' < > '22 25 26  ' GLUCOSE 98 105* 106*   < > 109* 108* 128*  BUN 22* 14 16   < > '13 17 17  ' CREATININE 0.83 0.72 0.81   < > 0.75 0.76 0.85  CALCIUM 9.6 9.0 9.5   < > 9.2 9.4 9.4  GFRNONAA >60 >60 >60   < > >60 >60 >60  GFRAA >60 >60 >60  --   --   --   --   PROT 6.7 6.6 7.1   < > 6.8 6.9 6.8  ALBUMIN 4.1 4.0 4.2   < > 4.2 4.2 4.2  AST 32 36 39   < > 45* 41 39  ALT 36 35 39   < > 49* 48* 44  ALKPHOS 72 76 76   < > 76 79 70  BILITOT 0.8 0.5 0.8   < > 0.6 0.5 0.7   < > = values in this interval not displayed.    RADIOGRAPHIC STUDIES: I have personally reviewed the radiological images as listed and agreed with the findings in the report. No results found. ASSESSMENT & PLAN:   Carcinoma of upper-outer quadrant of right breast in female, estrogen receptor positive ([place]) #Right  breast-invasive mammary carcinoma- S/P LUMPECTOMY pT1a [73m];pN0 [STAGE IA- ER/PRpoS; Her 2 POS]; STAGE I. S/p Taxol-Herceptin; currently on Herceptin maintenance. On Anastrazole. STABLE.   # Proceed with maintenance herceptin  today; Labs today reviewed;  acceptable for treatment today. 10/04- MUGA scan- 64; will repeat in 3 weeks.  Discussed that she will get Herceptin until May 2021.  # Right chest wall rash/Shingles-resolved; continue on Valtrex 500 mg once a day [sec prophy]; STABLE.   # SWOG study/PN study- PN- G-1 STABLE.   # Fatty liver- on [place] AST/ALT -grade 1 slightly elevated;  STABLE;   Monitor for now.  # OSTEOPENIA -BMD [ dec 2021-T-score of -1.2].  - on adjuvant zometa q 613M [dec 2021]   # Disposition:  # Herceptin today #  follow up in 3weeks- MD;[Thursday] labs- cbc/cmp; Herceptin; MUGA scan prior-Dr.B   All questions were answered. The patient/family knows to call the clinic with any problems, questions or concerns.    GCammie Sickle MD 05/14/2020 12:55 PM

## 2020-05-27 ENCOUNTER — Ambulatory Visit: Payer: 59 | Admitting: Obstetrics & Gynecology

## 2020-05-28 ENCOUNTER — Other Ambulatory Visit: Payer: Self-pay

## 2020-05-28 ENCOUNTER — Encounter
Admission: RE | Admit: 2020-05-28 | Discharge: 2020-05-28 | Disposition: A | Discharge status: Home / Self Care | Payer: 59 | Source: Ambulatory Visit | Attending: Internal Medicine | Admitting: Internal Medicine

## 2020-05-28 DIAGNOSIS — C50411 Malignant neoplasm of upper-outer quadrant of right female breast: Secondary | ICD-10-CM | POA: Diagnosis not present

## 2020-05-28 DIAGNOSIS — Z170 Estrogen receptor positive status [ER+]: Secondary | ICD-10-CM | POA: Diagnosis not present

## 2020-05-28 DIAGNOSIS — C50919 Malignant neoplasm of unspecified site of unspecified female breast: Secondary | ICD-10-CM | POA: Diagnosis not present

## 2020-05-28 MED ORDER — TECHNETIUM TC 99M-LABELED RED BLOOD CELLS IV KIT
25.0000 | PACK | Freq: Once | INTRAVENOUS | Status: AC | PRN
  Administered 2020-05-28: 21.9 via INTRAVENOUS

## 2020-06-04 ENCOUNTER — Inpatient Hospital Stay (HOSPITAL_BASED_OUTPATIENT_CLINIC_OR_DEPARTMENT_OTHER): Payer: 59 | Admitting: Internal Medicine

## 2020-06-04 ENCOUNTER — Encounter: Payer: Self-pay | Admitting: Internal Medicine

## 2020-06-04 ENCOUNTER — Inpatient Hospital Stay: Payer: 59

## 2020-06-04 ENCOUNTER — Inpatient Hospital Stay: Payer: 59 | Attending: Internal Medicine

## 2020-06-04 DIAGNOSIS — Z170 Estrogen receptor positive status [ER+]: Secondary | ICD-10-CM

## 2020-06-04 DIAGNOSIS — C50411 Malignant neoplasm of upper-outer quadrant of right female breast: Secondary | ICD-10-CM

## 2020-06-04 DIAGNOSIS — Z5112 Encounter for antineoplastic immunotherapy: Secondary | ICD-10-CM | POA: Insufficient documentation

## 2020-06-04 LAB — COMPREHENSIVE METABOLIC PANEL
ALT: 46 U/L — ABNORMAL HIGH (ref 0–44)
AST: 40 U/L (ref 15–41)
Albumin: 4.1 g/dL (ref 3.5–5.0)
Alkaline Phosphatase: 71 U/L (ref 38–126)
Anion gap: 10 (ref 5–15)
BUN: 18 mg/dL (ref 6–20)
CO2: 25 mmol/L (ref 22–32)
Calcium: 9 mg/dL (ref 8.9–10.3)
Chloride: 106 mmol/L (ref 98–111)
Creatinine, Ser: 0.69 mg/dL (ref 0.44–1.00)
GFR, Estimated: >60 mL/min (ref >60)
Glucose, Bld: 123 mg/dL — ABNORMAL HIGH (ref 70–99)
Potassium: 4.3 mmol/L (ref 3.5–5.1)
Sodium: 141 mmol/L (ref 135–145)
Total Bilirubin: 0.7 mg/dL (ref 0.3–1.2)
Total Protein: 6.8 g/dL (ref 6.5–8.1)

## 2020-06-04 LAB — CBC WITH DIFFERENTIAL/PLATELET
Abs Immature Granulocytes: 0.03 10*3/uL (ref 0.00–0.07)
Basophils Absolute: 0.1 10*3/uL (ref 0.0–0.1)
Basophils Relative: 2 %
Eosinophils Absolute: 0.2 10*3/uL (ref 0.0–0.5)
Eosinophils Relative: 4 %
HCT: 36.7 % (ref 36.0–46.0)
Hemoglobin: 11.8 g/dL — ABNORMAL LOW (ref 12.0–15.0)
Immature Granulocytes: 1 %
Lymphocytes Relative: 23 %
Lymphs Abs: 1.1 10*3/uL (ref 0.7–4.0)
MCH: 28.4 pg (ref 26.0–34.0)
MCHC: 32.2 g/dL (ref 30.0–36.0)
MCV: 88.2 fL (ref 80.0–100.0)
Monocytes Absolute: 0.4 10*3/uL (ref 0.1–1.0)
Monocytes Relative: 10 %
Neutro Abs: 2.7 10*3/uL (ref 1.7–7.7)
Neutrophils Relative %: 60 %
Platelets: 214 10*3/uL (ref 150–400)
RBC: 4.16 MIL/uL (ref 3.87–5.11)
RDW: 13.8 % (ref 11.5–15.5)
WBC: 4.5 10*3/uL (ref 4.0–10.5)
nRBC: 0 % (ref 0.0–0.2)

## 2020-06-04 MED ORDER — SODIUM CHLORIDE 0.9 % IV SOLN
Freq: Once | INTRAVENOUS | Status: AC
  Filled 2020-06-04: qty 250

## 2020-06-04 MED ORDER — HEPARIN SOD (PORK) LOCK FLUSH 100 UNIT/ML IV SOLN
INTRAVENOUS | Status: AC
  Filled 2020-06-04: qty 5

## 2020-06-04 MED ORDER — TRASTUZUMAB-ANNS CHEMO 150 MG IV SOLR
450.0000 mg | Freq: Once | INTRAVENOUS | Status: AC
  Administered 2020-06-04: 450 mg via INTRAVENOUS
  Filled 2020-06-04: qty 21.43

## 2020-06-04 MED ORDER — DIPHENHYDRAMINE HCL 25 MG PO CAPS
25.0000 mg | ORAL_CAPSULE | Freq: Once | ORAL | Status: AC
Start: 2020-06-04 — End: 2020-06-04
  Administered 2020-06-04: 25 mg via ORAL
  Filled 2020-06-04: qty 1

## 2020-06-04 MED ORDER — HEPARIN SOD (PORK) LOCK FLUSH 100 UNIT/ML IV SOLN
500.0000 [IU] | Freq: Once | INTRAVENOUS | Status: AC | PRN
  Administered 2020-06-04: 500 [IU]
  Filled 2020-06-04: qty 5

## 2020-06-04 MED ORDER — ACETAMINOPHEN 325 MG PO TABS
650.0000 mg | ORAL_TABLET | Freq: Once | ORAL | Status: AC
  Administered 2020-06-04: 650 mg via ORAL
  Filled 2020-06-04: qty 2

## 2020-06-04 NOTE — Assessment & Plan Note (Addendum)
#  Right breast-invasive mammary carcinoma- S/P LUMPECTOMY pT1a [42mm];pN0 [STAGE IA- ER/PRpoS; Her 2 POS]; STAGE I. S/p Taxol-Herceptin; currently on Herceptin maintenance. On Anastrazole. STABLE.   # Proceed with maintenance herceptin today; Labs today reviewed;  acceptable for treatment today. 10/04- MUGA scan- 64; FEB 2022- 67%.  Discussed that she will get Herceptin until May 2021.  # Right chest wall rash/Shingles-resolved; continue on Valtrex 500 mg once a day [sec prophy]; STABLE.   # SWOG study/PN study- PN- G-1 STABLE.   # Fatty liver- on [place]- AST/ALT -grade 1 slightly elevated; STABLE; recommend weight loss/diet.   # OSTEOPENIA -BMD [ dec 2021-T-score of -1.2].  - on adjuvant zometa q 6 M [jan 6th,0222]  # Dental filling- [march 8th, 2022]- no contraindications from oncology stand point.   # Disposition:  # Herceptin today #  follow up in 3weeks- MD;[Thursday] labs- cbc/cmp; Herceptin; -Dr.B

## 2020-06-04 NOTE — Progress Notes (Signed)
one [place] NOTE  Patient Care Team: Birdie Sons, MD as PCP - General (Family Medicine) [facility] Gyn (Gynecology) Theodore Demark, RN as Oncology Nurse Navigator Cammie Sickle, MD as Consulting Physician (Hematology and Oncology)  CHIEF COMPLAINTS/PURPOSE OF CONSULTATION: Breast cancer  #  Oncology History Overview Note  # MARCH 2021- RUOQ- 17m- [BREAST, RIGHT, LATERAL POSTERIOR  - INVASIVE MAMMARY CARCINOMA, NO SPECIAL TYPE, ASSOCIATED WITH  CALCIFICATIONS.][place] ER-> 90%; PR- 50-90%; HER 2 FISH- POSITIVE  # S/P LUMPECTOMY [Dr.Cintron] pT1a [463m;pN0 [STAGE IA- ER/PRpoS; Her 2 POS]  . BREAST, RIGHT, LATERAL MIDDLE DEPTH (RIBBON-SHAPED CLIP):  STEREOTACTIC-GUIDED CORE BIOPSY: - DUCTAL CARCINOMA IN SITU (DCIS), HIGH-GRADE, ASSOCIATED WITH  CALCIFICATIONS.   # May 14th,2021- TH-H; s/p RT [finished OCT 2021]; NOV 23rd 2021- start Anastrazole  #Hypothyroidism-methimazole [Dr.Solum]  # SURVIVORSHIP:   # GENETICS:   DIAGNOSIS: Breast cancer  STAGE:   1      ;  GOALS: Cure  CURRENT/MOST RECENT THERAPY : Taxol Herceptin   Carcinoma of upper-outer quadrant of right breast in female, estrogen receptor positive ([facility] 07/15/2019 Initial Diagnosis   Carcinoma of upper-outer quadrant of right breast in female, estrogen receptor positive ([facility]CD[place]  08/28/2019 -  Chemotherapy    Patient is on Treatment Plan: BREAST WEEKLY PACLITAXEL / TRASTUZUMAB / MAINTENANCE TRASTUZUMAB EVERY 21 DAYS        HISTORY OF PRESENTING ILLNESS:  AnEvonda Dapolito [age].o.  female with breast cancer early stage-ER/PR positive HER-2/neu positive on adjuvant anastrozole/Herceptin maintenance is here for follow-up/review results of the MUGA scan.  Denies any worsening tingling or numbness.  No headaches.  No body aches or joint pains.   Review of Systems  Constitutional: Positive for malaise/fatigue. Negative for chills, diaphoresis, fever and weight loss.  HENT: Negative for  nosebleeds and sore throat.   Eyes: Negative for double vision.  Respiratory: Negative for cough, hemoptysis, sputum production, shortness of breath and wheezing.   Cardiovascular: Negative for chest pain, palpitations, orthopnea and leg swelling.  Gastrointestinal: Negative for abdominal pain, blood in stool, constipation, diarrhea, heartburn, melena, nausea and vomiting.  Genitourinary: Negative for dysuria, frequency and urgency.  Musculoskeletal: Negative for back pain and joint pain.  Skin: Negative for itching.  Neurological: Negative for dizziness, focal weakness, weakness and headaches.  Endo/Heme/Allergies: Does not bruise/bleed easily.  Psychiatric/Behavioral: Negative for depression. The patient is not nervous/anxious and does not have insomnia.      MEDICAL HISTORY:  Past Medical History:  Diagnosis Date  . Atypical mole 09/26/2018   right mid anterior side/mild  . Basal cell carcinoma 08/25/2014   right med cheek infraorbital  . Breast cancer ([facility]CM[place]04/2021   [facility] DCIS  . Dyslipidemia   . Family history of bladder cancer   . Graves disease   . Hyperthyroidism   . Inborn lipid storage disorder   . Leiomyoma of uterus   . Vitamin D deficiency     SURGICAL HISTORY: Past Surgical History:  Procedure Laterality Date  . APPENDECTOMY  1982   Cyst removed from ovaries   . BREAST BIOPSY Right 07/05/2019   stereo bx, coil clip, IMC  . BREAST BIOPSY Right 07/05/2019   stereo bx, ribbon clip, DCIS   . BREAST LUMPECTOMY Right 07/31/2019    [facility]  . COLONOSCOPY WITH PROPOFOL N/A 01/20/2017   Procedure: COLONOSCOPY WITH PROPOFOL;  Surgeon: AnJonathon BellowsMD;  [facility];  Service: Gastroenterology;  Laterality:  N/A;  . [place]  . NECK SURGERY  2013   LIft from weight loss  . PARTIAL MASTECTOMY WITH NEEDLE LOCALIZATION AND AXILLARY SENTINEL LYMPH NODE BX Right 07/31/2019   Procedure: RIGHT PARTIAL MASTECTOMY WITH  BRACKETED NEEDLE LOCALIZATION AND LEFT AXILLARY SENTINEL LYMPH NODE BIOPSY;  Surgeon: Herbert Pun, MD;  Location: [facility] ORS;  Service: General;  Laterality: Right;  . PORTACATH PLACEMENT Left 07/31/2019   Procedure: INSERTION PORT-A-CATH LEFT INTERNAL JUGULAR;  Surgeon: Herbert Pun, MD;  Location: [facility] ORS;  Service: General;  Laterality: Left;  . TONSILLECTOMY  1969    SOCIAL HISTORY: Social History   Socioeconomic History  . Marital status: Married    Spouse name: Not on file  . Number of children: 0  . Years of education: Anselm Lis  . Highest education level: Not on file  Occupational History  . Occupation: Full-Time    Comment: [employer] x[employer]  Tobacco Use  . Smoking status: Former Research scientist (life sciences)  . Smokeless tobacco: Never Used  Vaping Use  . Vaping Use: Never used  Substance and Sexual Activity  . Alcohol use: Yes    Alcohol/week: 0.0 standard drinks    Comment: RARELY  . Drug use: No  . Sexual activity: Yes    Birth control/protection: Post-menopausal  Other Topics Concern  . Not on file  Social History Narrative   Lives close to [place]; with husband. Quit smoking in 2007 [25 years]; ocassional alcohol. 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    Social Determinants of Health   Financial Resource Strain: Not on file  Food Insecurity: Not on file  Transportation Needs: Not on file  Physical Activity: Not on file  Stress: Not on file  Social Connections: Not on file  Intimate Partner Violence: Not on file    FAMILY HISTORY: Family History  Problem Relation Age of Onset  . Bladder Cancer Mother   . Diabetes Father   . Heart disease Father   . Bladder Cancer Brother   . Breast cancer Neg Hx     ALLERGIES:  is allergic to penicillins.  MEDICATIONS:  Current Outpatient Medications  Medication Sig Dispense Refill  . anastrozole (ARIMIDEX) 1 MG tablet Take 1 tablet (1 mg total) by mouth daily. 90 tablet 1  . B COMPLEX VITAMINS PO Take 1 tablet by  mouth daily.     . chlorhexidine (PERIDEX) 0.12 % solution Use as directed 15 mLs in the mouth or throat 2 (two) times daily. 240 mL 0  . Cholecalciferol (VITAMIN D) 125 MCG (5000 UT) CAPS Take 5,000 Units by mouth daily.     . COLLAGEN PO Take 1 tablet by mouth daily. Super Collagen w/Biotin    . ibuprofen (ADVIL) 200 MG tablet Take 600 mg by mouth every 8 (eight) hours as needed (for pain.).     Marland Kitchen lidocaine-prilocaine (EMLA) cream Apply 1 application topically as needed. 30 g 0  . methimazole (TAPAZOLE) 5 MG tablet Take 5 mg by mouth daily.     . Multiple Vitamin (MULTIVITAMIN WITH MINERALS) TABS tablet Take 1 tablet by mouth daily.    . Omega 3-6-9 Fatty Acids (OMEGA 3-6-9 COMPLEX) CAPS Take 1 capsule by mouth daily.    . ondansetron (ZOFRAN) 8 MG tablet One pill every 8 hours as needed for nausea/vomitting. 40 tablet 1  . prochlorperazine (COMPAZINE) 10 MG tablet Take 1 tablet (10 mg total) by mouth every 6 (six) hours as needed for nausea or vomiting. 40 tablet 1  .  valACYclovir (VALTREX) 500 MG tablet TAKE ONE TABLET BY MOUTH DAILY 30 tablet 11   No current facility-administered medications for this visit.    PHYSICAL EXAMINATION: ECOG PERFORMANCE STATUS: 0 - Asymptomatic  Vitals:   06/04/20 0916  BP: 127/63  Pulse: 70  Resp: 16  Temp: (!) 97.5 F (36.4 C)  SpO2: 98%   Filed Weights   06/04/20 0916  Weight: 182 lb 3.2 oz (82.6 kg)    Physical Exam HENT:     Head: Normocephalic and atraumatic.     Mouth/Throat:     Pharynx: No oropharyngeal exudate.  Eyes:     Pupils: Pupils are equal, round, and reactive to light.  Cardiovascular:     Rate and Rhythm: Normal rate and regular rhythm.  Pulmonary:     Effort: Pulmonary effort is normal. No respiratory distress.     Breath sounds: Normal breath sounds. No wheezing.  Abdominal:     General: Bowel sounds are normal. There is no distension.     Palpations: Abdomen is soft. There is no mass.     Tenderness: There is no  abdominal tenderness. There is no guarding or rebound.  Musculoskeletal:        General: No tenderness. Normal range of motion.     Cervical back: Normal range of motion and neck supple.  Skin:    General: Skin is warm.  Neurological:     Mental Status: She is alert and oriented to person, place, and time.  Psychiatric:        Mood and Affect: Affect normal.    LABORATORY DATA:  I have reviewed the data as listed Lab Results  Component Value Date   WBC 4.5 06/04/2020   HGB 11.8 (L) 06/04/2020   HCT 36.7 06/04/2020   MCV 88.2 06/04/2020   PLT 214 06/04/2020   Recent Labs    11/20/19 0903 12/18/19 0840 01/01/20 0859 01/22/20 0905 04/23/20 0827 05/14/20 0907 06/04/20 0854  NA 139 143 139   < > 140 139 141  K 4.0 4.1 4.1   < > 4.1 3.9 4.3  CL 107 109 104   < > 105 105 106  CO2 _0 < > _1 GLUCOSE 98 105* 106*   < > 108* 128* 123*  BUN 22* 14 16   < > _2 CREATININE 0.83 0.72 0.81   < > 0.76 0.85 0.69  CALCIUM 9.6 9.0 9.5   < > 9.4 9.4 9.0  GFRNONAA >60 >60 >60   < > >60 >60 >60  GFRAA >60 >60 >60  --   --   --   --   PROT 6.7 6.6 7.1   < > 6.9 6.8 6.8  ALBUMIN 4.1 4.0 4.2   < > 4.2 4.2 4.1  AST 32 36 39   < > 41 39 40  ALT 36 35 39   < > 48* 44 46*  ALKPHOS 72 76 76   < > 79 70 71  BILITOT 0.8 0.5 0.8   < > 0.5 0.7 0.7   < > = values in this interval not displayed.    RADIOGRAPHIC STUDIES: I have personally reviewed the radiological images as listed and agreed with the findings in the report. NM Cardiac [place] Rest  Result Date: 05/28/2020 CLINICAL DATA:  Breast cancer.  Evaluate left ventricular function. EXAM: NUCLEAR MEDICINE CARDIAC BLOOD POOL IMAGING (MUGA) TECHNIQUE: Cardiac multi-gated acquisition was performed at rest  following intravenous injection of Tc-72mlabeled red blood cells. RADIOPHARMACEUTICALS:  21.9 mCi [id number] in-vitro labeled red blood cells IV COMPARISON:  01/20/2020 FINDINGS: The seen a images demonstrate normal  left ventricular wall motion. No akinetic or dyskinetic segments are identified. The left ventricular ejection fraction is estimated at 67.4%. This is unchanged since the prior study from 01/20/2020. IMPRESSION: Normal left ventricular wall motion with ejection fraction of 67.4%. Electronically Signed   By: P.Marijo Sanes.D.   On: 05/28/2020 16:07   ASSESSMENT & PLAN:   Carcinoma of upper-outer quadrant of right breast in female, estrogen receptor positive (HCAddis#Right breast-invasive mammary carcinoma- S/P LUMPECTOMY pT1a [66m59mpN0 [STAGE IA- ER/PRpoS; Her 2 POS]; STAGE I. S/p Taxol-Herceptin; currently on Herceptin maintenance. On Anastrazole. STABLE.   # Proceed with maintenance herceptin today; Labs today reviewed;  acceptable for treatment today. 10/04- MUGA scan- 64; FEB 2022- 67%.  Discussed that she will get Herceptin until May 2021.  # Right chest wall rash/Shingles-resolved; continue on Valtrex 500 mg once a day [sec prophy]; STABLE.   # SWOG study/PN study- PN- G-1 STABLE.   # Fatty liver- on [place]/ALT -grade 1 slightly elevated; STABLE; recommend weight loss/diet.   # OSTEOPENIA -BMD [ dec 2021-T-score of -1.2].  - on adjuvant zometa q 6 M [jan 6th,0222]  # Dental filling- [march 8th, 2022]- no contraindications from oncology stand point.   # Disposition:  # Herceptin today #  follow up in 3weeks- MD;[Thursday] labs- cbc/cmp; Herceptin; -Dr.B   All questions were answered. The patient/family knows to call the clinic with any problems, questions or concerns.    GovCammie SickleD 06/04/2020 9:50 AM

## 2020-06-08 ENCOUNTER — Ambulatory Visit (INDEPENDENT_AMBULATORY_CARE_PROVIDER_SITE_OTHER): Payer: 59 | Admitting: Obstetrics & Gynecology

## 2020-06-08 ENCOUNTER — Encounter: Payer: Self-pay | Admitting: Obstetrics & Gynecology

## 2020-06-08 ENCOUNTER — Other Ambulatory Visit: Payer: Self-pay

## 2020-06-08 VITALS — BP 140/80 | Ht 66.0 in | Wt 181.0 lb

## 2020-06-08 DIAGNOSIS — C50411 Malignant neoplasm of upper-outer quadrant of right female breast: Secondary | ICD-10-CM | POA: Diagnosis not present

## 2020-06-08 DIAGNOSIS — Z170 Estrogen receptor positive status [ER+]: Secondary | ICD-10-CM

## 2020-06-08 DIAGNOSIS — E7800 Pure hypercholesterolemia, unspecified: Secondary | ICD-10-CM

## 2020-06-08 DIAGNOSIS — Z01419 Encounter for gynecological examination (general) (routine) without abnormal findings: Secondary | ICD-10-CM | POA: Diagnosis not present

## 2020-06-08 DIAGNOSIS — Z9189 Other specified personal risk factors, not elsewhere classified: Secondary | ICD-10-CM | POA: Diagnosis not present

## 2020-06-08 DIAGNOSIS — Z1231 Encounter for screening mammogram for malignant neoplasm of breast: Secondary | ICD-10-CM | POA: Diagnosis not present

## 2020-06-08 DIAGNOSIS — R7303 Prediabetes: Secondary | ICD-10-CM

## 2020-06-08 DIAGNOSIS — Z1211 Encounter for screening for malignant neoplasm of colon: Secondary | ICD-10-CM | POA: Diagnosis not present

## 2020-06-08 NOTE — Patient Instructions (Signed)
PAP every three years Mammogram every year, in March    Call [phone] to schedule at [facility] Colonoscopy every 10 years    Stool cards given to you today to do at home and send in Labs today  [place] for Cancer today What is cancer? Normal cells in the body grow, divide, and are replaced on a routine basis. Sometimes, cells divide abnormally and begin to grow out of control. These cells may form growths or tumors. Tumors can be benign (not cancer) or malignant (cancer). Benign tumors do not spread to other body tissues. Cancer tumors can invade and destroy nearby healthy tissues, bones, and organs. Cancer cells also can spread to other parts of the body and form new cancerous areas.  What causes cancer? Cancer is caused by many different factors. A few types of cancer are caused by changes in genes that can be passed from parent to child. Changes in genes are called mutations. Certain gene mutations are associated with family cancer syndromes. What are family cancer syndromes?  Family cancer syndromes are genetic conditions that increase the risk of certain types of cancer. They also are called hereditary or inherited cancer syndromes. Common family cancer syndromes include hereditary breast and ovarian cancer (HBOC) syndrome, Lynch syndrome, Li-Fraumeni syndrome, Cowden syndrome, and Peutz-Jeghers syndrome. What is genetic testing for cancer? Genetic testing for cancer looks for mutations in certain genes that are known to be linked to cancer. The results can help determine your risk of developing a disease like cancer or passing on a genetic disorder. What is multigene panel testing? Multigene panel testing is a type of genetic testing that looks for mutations in several genes at once. This is different from single-gene testing, which looks for a mutation in a specific gene. Single-gene testing is often used when there is already a known gene mutation in a family. For example,  testing for BRCA mutations only looks for changes in BRCA1 and BRCA2 genes. Who should have genetic testing? You may consider genetic testing if your personal or family history shows that you have an increased risk of cancer. Your obstetrician-gynecologist (ob-gyn) or other health care professional may ask you these and other questions: . Have you or any family members been diagnosed with cancer?  . If yes, which family members were diagnosed, with what types of cancer, and at what ages?  . Were you or any of your family members born with birth defects?  Connie Bradford Are you of [place] or [place] Jewish ancestry? Depending on your answers, your ob-gyn or other health care professional may suggest that you talk about genetic testing with a genetic counselor or a physician who is an expert in genetics. You can choose to have genetic testing, or you can choose not to. Before you decide, you should have genetic counseling. What is genetic counseling? In genetic counseling, you will talk with a genetic counselor or physician expert about the following: . Your risk of getting a hereditary type of cancer  . Who in your family could potentially get tested  . How testing is done  . What the test results may mean  . What you may do depending on the results How is genetic testing done? Genetic testing typically is done from a blood sample or saliva sample. When is multigene panel testing recommended? Multigene panel testing may be useful if you . are at risk of a family cancer syndrome that has more than one gene associated with it  . have  a personal or family history of cancer and single-gene testing has not found a mutation, or the result is uncertain What are the benefits of multigene panel testing? Multigene panel testing looks at multiple genes with one test. If a gene mutation is found, multigene panel testing may . give you a better understanding of your cancer risk than single-gene testing  . help  your health care team decide what cancer screenings you might need beyond routine screenings  . help you think about what you can do to prevent cancer What are the risks of multigene panel testing? The risks of multigene panel testing may include the following: . Results can be complicated to interpret.  . Testing may find gene mutations that show a moderate or uncertain risk of cancer.  . It may be hard to know what you should do with your test results. You should talk with a genetic counselor or physician expert before and after genetic testing to learn what the results mean. If I have a gene mutation, should I tell my family? Having a gene mutation means you can pass the mutation to your children. Your siblings also may have the gene mutation. Although you do not have to tell your family members, sharing the information could be life-saving for them. With this information, your family members can decide whether to be tested and get cancer screenings at an early age. How can I prevent cancer if I test positive for a gene mutation? If you test positive for a gene mutation, you can discuss cancer screening and prevention options with your ob-gyn, genetic counselor, or other health care professional. It may be helpful to have earlier or more frequent cancer screening tests, which can find cancer at an early and more curable stage. Risk reduction steps like medication, surgery, and lifestyle changes also may be recommended. I'm concerned about discrimination based on genetic testing results. What should I know? Many people are concerned about possible employment discrimination or denial of insurance coverage based on genetic testing results. The Genetic Information Nondiscrimination Act of 2008 (GINA) makes it illegal for health insurers to require genetic testing results or use results to make decisions about coverage, rates, or preexisting conditions. GINA also makes it illegal for employers to  discriminate against employees or applicants because of genetic information. GINA does not apply to life insurance, long-term care insurance, or disability insurance. What should I know about direct-to-consumer genetic tests? Direct-to-consumer (or at-home) genetic tests are sold over the internet. You do not need a doctor's order to get one. The [employer] discourages use of direct-to-consumer genetic tests because the results may be misleading. For example, one commercial test for BRCA mutations only looks for three mutations, even though there are more than 500 BRCA mutations linked to cancer. The test results could cause unnecessary fear, or a false sense that you are not at risk. You should see a health care professional if you want a genetic test.  Glossary BRCA1 and BRCA2: Genes that keep cells from growing too rapidly. Changes in these genes have been linked to an increased risk of breast cancer and ovarian cancer.  Cowden Syndrome: A genetic condition that increases a person's risk of cancer of the breast, thyroid, uterus, colon, kidney, and skin. Genes: Segments of DNA that contain instructions for the development of a person's physical traits and control of the processes in the body. The gene is the basic unit of heredity and can be passed from parent  to child. Genetic Counselor: A health care professional with special training in genetics who can provide expert advice about genetic disorders and prenatal testing. Hereditary Breast and Ovarian Cancer (HBOC) Syndrome: A genetic condition that increases a person's risk of cancer of the breast, ovary, prostate, pancreas, and skin (melanoma). Li-Fraumeni Syndrome: A genetic condition that increases a person's risk of cancer of the breast, bones, soft tissue, brain, and outer layer of the adrenal glands. Lynch Syndrome: A genetic condition that increases a person's risk of cancer of the colon, rectum, ovary,  uterus, pancreas, and bile duct. Multigene Panel Testing: A type of genetic test that can look for mutations in multiple genes at once. Mutations: Changes in genes that can be passed from parent to child. Obstetrician-Gynecologist (Ob-Gyn): A doctor with special training and education in women's health. Peutz-Jeghers Syndrome: A genetic condition that increases a person's risk of cancer of the stomach, intestines, pancreas, cervix, ovary, and breast.   Thank you for choosing [facility]. As part of our ongoing efforts to improve patient experience, we would appreciate your feedback. Please fill out the short survey that you will receive by mail or MyChart. Your opinion is important to [place]! - Dr. Kenton Kingfisher

## 2020-06-08 NOTE — Progress Notes (Signed)
HPI:      [name] is a 60 y.o. G0P0000 who LMP was in the past, she presents today for her annual examination.  The patient has no complaints today. The patient is sexually active. Herlast pap: approximate date 2021 and was normal and last mammogram: approximate date 2021 and was abnormal: she was then dx with right breats cancer, s/p lumpectomy, chemo, and RT (still) and is on Arimidex.  The patient does perform self breast exams.  There is notable family history of breast or ovarian cancer in her family. The patient is not taking hormone replacement therapy. Patient denies post-menopausal vaginal bleeding.   The patient has regular exercise: yes. The patient denies current symptoms of depression.    GYN Hx: Last Colonoscopy:9 years ago ago. Normal.   PMHx: Past Medical History:  Diagnosis Date  . Atypical mole 09/26/2018   right mid anterior side/mild  . Basal cell carcinoma 08/25/2014   right med cheek infraorbital  . Breast cancer ([place]) 07/2019   [facility] and DCIS  . Dyslipidemia   . Family history of bladder cancer   . Graves disease   . Hyperthyroidism   . Inborn lipid storage disorder   . Leiomyoma of uterus   . Vitamin D deficiency    Past Surgical History:  Procedure Laterality Date  . APPENDECTOMY  1982   Cyst removed from ovaries   . BREAST BIOPSY Right 07/05/2019   stereo bx, coil clip, [facility]  . BREAST BIOPSY Right 07/05/2019   stereo bx, ribbon clip, DCIS   . BREAST LUMPECTOMY Right 07/31/2019    [facility] and DCIS with SN bx  . COLONOSCOPY WITH PROPOFOL N/A 01/20/2017   Procedure: COLONOSCOPY WITH PROPOFOL;  Surgeon: Jonathon Bellows, MD;  Location: [facility] ENDOSCOPY;  Service: Gastroenterology;  Laterality: N/A;  . [place]  . NECK SURGERY  2013   LIft from weight loss  . PARTIAL MASTECTOMY WITH NEEDLE LOCALIZATION AND AXILLARY SENTINEL LYMPH NODE BX Right 07/31/2019   Procedure: RIGHT PARTIAL MASTECTOMY WITH BRACKETED NEEDLE LOCALIZATION AND  LEFT AXILLARY SENTINEL LYMPH NODE BIOPSY;  Surgeon: Herbert Pun, MD;  Location: [facility] ORS;  Service: General;  Laterality: Right;  . PORTACATH PLACEMENT Left 07/31/2019   Procedure: INSERTION PORT-A-CATH LEFT INTERNAL JUGULAR;  Surgeon: Herbert Pun, MD;  Location: [facility] ORS;  Service: General;  Laterality: Left;  . TONSILLECTOMY  1969   Family History  Problem Relation Age of Onset  . Bladder Cancer Mother   . Diabetes Father   . Heart disease Father   . Bladder Cancer Brother   . Breast cancer Neg Hx    Social History   Tobacco Use  . Smoking status: Former Research scientist (life sciences)  . Smokeless tobacco: Never Used  Vaping Use  . Vaping Use: Never used  Substance Use Topics  . Alcohol use: Yes    Alcohol/week: 0.0 standard drinks    Comment: RARELY  . Drug use: No    Current Outpatient Medications:  .  anastrozole (ARIMIDEX) 1 MG tablet, Take 1 tablet (1 mg total) by mouth daily., Disp: 90 tablet, Rfl: 1 .  B COMPLEX VITAMINS PO, Take 1 tablet by mouth daily. , Disp: , Rfl:  .  chlorhexidine (PERIDEX) 0.12 % solution, Use as directed 15 mLs in the mouth or throat 2 (two) times daily., Disp: 240 mL, Rfl: 0 .  Cholecalciferol (VITAMIN D) 125 MCG (5000 UT) CAPS, Take 5,000 Units by mouth daily. , Disp: , Rfl:  .  COLLAGEN PO, Take 1 tablet by mouth daily. Super Collagen w/Biotin, Disp: , Rfl:  .  ibuprofen (ADVIL) 200 MG tablet, Take 600 mg by mouth every 8 (eight) hours as needed (for pain.). , Disp: , Rfl:  .  lidocaine-prilocaine (EMLA) cream, Apply 1 application topically as needed., Disp: 30 g, Rfl: 0 .  methimazole (TAPAZOLE) 5 MG tablet, Take 5 mg by mouth daily. , Disp: , Rfl:  .  Multiple Vitamin (MULTIVITAMIN WITH MINERALS) TABS tablet, Take 1 tablet by mouth daily., Disp: , Rfl:  .  Omega 3-6-9 Fatty Acids (OMEGA 3-6-9 COMPLEX) CAPS, Take 1 capsule by mouth daily., Disp: , Rfl:  .  ondansetron (ZOFRAN) 8 MG tablet, One pill every 8 hours as needed for nausea/vomitting.,  Disp: 40 tablet, Rfl: 1 .  prochlorperazine (COMPAZINE) 10 MG tablet, Take 1 tablet (10 mg total) by mouth every 6 (six) hours as needed for nausea or vomiting., Disp: 40 tablet, Rfl: 1 .  valACYclovir (VALTREX) 500 MG tablet, TAKE ONE TABLET BY MOUTH DAILY, Disp: 30 tablet, Rfl: 11 Allergies: Penicillins  Review of Systems  Constitutional: Negative for chills, fever and malaise/fatigue.  HENT: Negative for congestion, sinus pain and sore throat.   Eyes: Negative for blurred vision and pain.  Respiratory: Negative for cough and wheezing.   Cardiovascular: Negative for chest pain and leg swelling.  Gastrointestinal: Negative for abdominal pain, constipation, diarrhea, heartburn, nausea and vomiting.  Genitourinary: Negative for dysuria, frequency, hematuria and urgency.  Musculoskeletal: Negative for back pain, joint pain, myalgias and neck pain.  Skin: Negative for itching and rash.  Neurological: Negative for dizziness, tremors and weakness.  Endo/Heme/Allergies: Does not bruise/bleed easily.  Psychiatric/Behavioral: Negative for depression. The patient is not nervous/anxious and does not have insomnia.     Objective: BP 140/80   Ht '5\' 6"'  (1.676 m)   Wt 181 lb (82.1 kg)   LMP 12/18/2013   BMI 29.21 kg/m   Filed Weights   06/08/20 0950  Weight: 181 lb (82.1 kg)   Body mass index is 29.21 kg/m. Physical Exam Constitutional:      General: She is not in acute distress.    Appearance: She is well-developed and well-nourished.  Genitourinary:     Vagina, uterus and rectum normal.     There is no rash or lesion on the right labia.     There is no rash or lesion on the left labia.    No lesions in the vagina.     No vaginal bleeding.      Right Adnexa: not tender and no mass present.    Left Adnexa: not tender and no mass present.    No cervical motion tenderness, friability, lesion or polyp.     Uterus is mobile.     Uterus is not enlarged.     No uterine mass detected.     Uterus is midaxial.     Pelvic exam was performed with patient supine.  Breasts:     Right: No mass, skin change or tenderness.     Left: No mass, skin change or tenderness.    HENT:     Head: Normocephalic and atraumatic. No laceration.     Right Ear: Hearing normal.     Left Ear: Hearing normal.     Nose: No epistaxis or foreign body.     Mouth/Throat:     Mouth: Oropharynx is clear and moist and mucous membranes are normal.     Pharynx: Uvula midline.  Eyes:     Pupils: Pupils are equal, round, and reactive to light.  Neck:     Thyroid: No thyromegaly.  Cardiovascular:     Rate and Rhythm: Normal rate and regular rhythm.     Heart sounds: No murmur heard. No friction rub. No gallop.   Pulmonary:     Effort: Pulmonary effort is normal. No respiratory distress.     Breath sounds: Normal breath sounds. No wheezing.  Abdominal:     General: Bowel sounds are normal. There is no distension.     Palpations: Abdomen is soft.     Tenderness: There is no abdominal tenderness. There is no rebound.  Musculoskeletal:        General: Normal range of motion.     Cervical back: Normal range of motion and neck supple.  Neurological:     Mental Status: She is alert and oriented to person, place, and time.     Cranial Nerves: No cranial nerve deficit.  Skin:    General: Skin is warm and dry.  Psychiatric:        Mood and Affect: Mood and affect normal.        Judgment: Judgment normal.  Vitals reviewed.     Assessment: Annual Exam 1. Women's annual routine gynecological examination   2. Encounter for screening mammogram for malignant neoplasm of breast   3. Screen for colon cancer   4. Hypercholesterolemia   5. Pre-diabetes     Plan:            1.  Cervical Screening-  Pap smear schedule reviewed with patient  2. Breast screening- Exam annually and mammogram scheduled  3. Colonoscopy every 10 years, Hemoccult testing after age 7  4. Labs Ordered today  Niacin for  cholesterol May need referral back to PCP for hypercholesterolemia and/or prediabetes  5. Counseling for hormonal therapy: none No sx's of menopause Hormones not advised based on breast cancer history              6. Breast cancer and cancer risk in general -  She presents with a significant personal and/or family history of breast cancer self, dx last year. Details of which can be found in her medical/family history. She does not have a previously identified BRCA and Lynch syndrome mutation in her family. Due to her personal and/or family history of cancer she is a candidate for the [facility] test(s).    Risk for cancer, genetic susceptibility discussed.  Patient has requested gene testing.  Discussed BRCA as well as Lynch syndrome and other cancer risk assessments available based on her family history and personal history. Pros and cons of testing discussed.  An additional total of 20 minutes were spent face-to-face with the patient as well as preparation, review, communication, and documentation during this encounter as it pertains to breast cancer and hereditary cancer testing and preventative strategies.      F/U  Return in about 1 year (around 06/08/2021) for Annual.  Barnett Applebaum, MD, [facility], [facility] 06/08/2020  10:31 AM

## 2020-06-09 LAB — HEMOGLOBIN A1C
Est. average glucose Bld gHb Est-mCnc: 123 mg/dL
Hgb A1c MFr Bld: 5.9 % — ABNORMAL HIGH (ref 4.8–5.6)

## 2020-06-09 LAB — LIPID PANEL
Chol/HDL Ratio: 4.1 ratio (ref 0.0–4.4)
Cholesterol, Total: 289 mg/dL — ABNORMAL HIGH (ref 100–199)
HDL: 70 mg/dL (ref >39)
LDL Chol Calc (NIH): 184 mg/dL — ABNORMAL HIGH (ref 0–99)
Triglycerides: 188 mg/dL — ABNORMAL HIGH (ref 0–149)
VLDL Cholesterol Cal: 35 mg/dL (ref 5–40)

## 2020-06-19 ENCOUNTER — Other Ambulatory Visit: Payer: Self-pay | Admitting: General Surgery

## 2020-06-19 DIAGNOSIS — Z853 Personal history of malignant neoplasm of breast: Secondary | ICD-10-CM

## 2020-06-22 DIAGNOSIS — M216X1 Other acquired deformities of right foot: Secondary | ICD-10-CM | POA: Diagnosis not present

## 2020-06-22 DIAGNOSIS — M722 Plantar fascial fibromatosis: Secondary | ICD-10-CM | POA: Diagnosis not present

## 2020-06-22 DIAGNOSIS — M2141 Flat foot [pes planus] (acquired), right foot: Secondary | ICD-10-CM | POA: Diagnosis not present

## 2020-06-22 DIAGNOSIS — M79671 Pain in right foot: Secondary | ICD-10-CM | POA: Diagnosis not present

## 2020-06-25 ENCOUNTER — Inpatient Hospital Stay (HOSPITAL_BASED_OUTPATIENT_CLINIC_OR_DEPARTMENT_OTHER): Payer: 59 | Admitting: Internal Medicine

## 2020-06-25 ENCOUNTER — Encounter: Payer: Self-pay | Admitting: Internal Medicine

## 2020-06-25 ENCOUNTER — Inpatient Hospital Stay: Payer: 59 | Attending: Internal Medicine

## 2020-06-25 ENCOUNTER — Inpatient Hospital Stay: Payer: 59

## 2020-06-25 VITALS — Resp 18

## 2020-06-25 DIAGNOSIS — Z87891 Personal history of nicotine dependence: Secondary | ICD-10-CM | POA: Diagnosis not present

## 2020-06-25 DIAGNOSIS — Z170 Estrogen receptor positive status [ER+]: Secondary | ICD-10-CM

## 2020-06-25 DIAGNOSIS — Z5112 Encounter for antineoplastic immunotherapy: Secondary | ICD-10-CM | POA: Insufficient documentation

## 2020-06-25 DIAGNOSIS — C50411 Malignant neoplasm of upper-outer quadrant of right female breast: Secondary | ICD-10-CM

## 2020-06-25 LAB — COMPREHENSIVE METABOLIC PANEL
ALT: 45 U/L — ABNORMAL HIGH (ref 0–44)
AST: 46 U/L — ABNORMAL HIGH (ref 15–41)
Albumin: 4.2 g/dL (ref 3.5–5.0)
Alkaline Phosphatase: 68 U/L (ref 38–126)
Anion gap: 8 (ref 5–15)
BUN: 17 mg/dL (ref 6–20)
CO2: 25 mmol/L (ref 22–32)
Calcium: 9.4 mg/dL (ref 8.9–10.3)
Chloride: 108 mmol/L (ref 98–111)
Creatinine, Ser: 0.73 mg/dL (ref 0.44–1.00)
GFR, Estimated: >60 mL/min (ref >60)
Glucose, Bld: 109 mg/dL — ABNORMAL HIGH (ref 70–99)
Potassium: 4.1 mmol/L (ref 3.5–5.1)
Sodium: 141 mmol/L (ref 135–145)
Total Bilirubin: 0.8 mg/dL (ref 0.3–1.2)
Total Protein: 6.9 g/dL (ref 6.5–8.1)

## 2020-06-25 LAB — CBC WITH DIFFERENTIAL/PLATELET
Abs Immature Granulocytes: 0.02 10*3/uL (ref 0.00–0.07)
Basophils Absolute: 0.1 10*3/uL (ref 0.0–0.1)
Basophils Relative: 2 %
Eosinophils Absolute: 0.2 10*3/uL (ref 0.0–0.5)
Eosinophils Relative: 4 %
HCT: 38.1 % (ref 36.0–46.0)
Hemoglobin: 12.1 g/dL (ref 12.0–15.0)
Immature Granulocytes: 0 %
Lymphocytes Relative: 22 %
Lymphs Abs: 1.1 10*3/uL (ref 0.7–4.0)
MCH: 28.4 pg (ref 26.0–34.0)
MCHC: 31.8 g/dL (ref 30.0–36.0)
MCV: 89.4 fL (ref 80.0–100.0)
Monocytes Absolute: 0.5 10*3/uL (ref 0.1–1.0)
Monocytes Relative: 10 %
Neutro Abs: 3 10*3/uL (ref 1.7–7.7)
Neutrophils Relative %: 62 %
Platelets: 194 10*3/uL (ref 150–400)
RBC: 4.26 MIL/uL (ref 3.87–5.11)
RDW: 13.3 % (ref 11.5–15.5)
WBC: 4.8 10*3/uL (ref 4.0–10.5)
nRBC: 0 % (ref 0.0–0.2)

## 2020-06-25 MED ORDER — HEPARIN SOD (PORK) LOCK FLUSH 100 UNIT/ML IV SOLN
500.0000 [IU] | Freq: Once | INTRAVENOUS | Status: AC | PRN
  Administered 2020-06-25: 500 [IU]
  Filled 2020-06-25: qty 5

## 2020-06-25 MED ORDER — HEPARIN SOD (PORK) LOCK FLUSH 100 UNIT/ML IV SOLN
INTRAVENOUS | Status: AC
  Filled 2020-06-25: qty 5

## 2020-06-25 MED ORDER — ACETAMINOPHEN 325 MG PO TABS
650.0000 mg | ORAL_TABLET | Freq: Once | ORAL | Status: AC
Start: 2020-06-25 — End: 2020-06-25
  Administered 2020-06-25: 650 mg via ORAL
  Filled 2020-06-25: qty 2

## 2020-06-25 MED ORDER — TRASTUZUMAB-ANNS CHEMO 150 MG IV SOLR
450.0000 mg | Freq: Once | INTRAVENOUS | Status: AC
  Administered 2020-06-25: 450 mg via INTRAVENOUS
  Filled 2020-06-25: qty 21.43

## 2020-06-25 MED ORDER — DIPHENHYDRAMINE HCL 25 MG PO CAPS
25.0000 mg | ORAL_CAPSULE | Freq: Once | ORAL | Status: AC
Start: 2020-06-25 — End: 2020-06-25
  Administered 2020-06-25: 25 mg via ORAL
  Filled 2020-06-25: qty 1

## 2020-06-25 MED ORDER — SODIUM CHLORIDE 0.9 % IV SOLN
Freq: Once | INTRAVENOUS | Status: AC
  Filled 2020-06-25: qty 250

## 2020-06-25 NOTE — Assessment & Plan Note (Addendum)
#  Right breast-invasive mammary carcinoma- STAGE I- TRIPLE POSITIVE;currently on Herceptin maintenance. On Anastrazole. STABLE.   # Proceed with maintenance herceptin today; Labs today reviewed;  acceptable for treatment today. 10/04- MUGA scan- 64; FEB 2022- 67%.  Discussed that she will get Herceptin until May 2022.   # Right chest wall rash/Shingles-resolved; continue on Valtrex 500 mg once a day [sec prophy]; STABLE.  # SWOG study/PN study- PN- G-0-1. STABLE.    # Fatty liver- on [place]- AST/ALT -grade 1 slightly elevated; STABLE; recommend weight loss/diet.   # OSTEOPENIA -BMD [ dec 2021-T-score of -1.2].  - on adjuvant zometa q 6 M [jan 6th,0222]; STABLE.   # Disposition:  # Herceptin today #  follow up in 3weeks- MD;[Thursday] labs- cbc/cmp; Herceptin; -Dr.B

## 2020-06-25 NOTE — Progress Notes (Signed)
one [place] NOTE  Patient Care Team: Birdie Sons, MD as PCP - General (Family Medicine) [facility] Gyn (Gynecology) Theodore Demark, RN as Oncology Nurse Navigator Cammie Sickle, MD as Consulting Physician (Hematology and Oncology)  CHIEF COMPLAINTS/PURPOSE OF CONSULTATION: Breast cancer  #  Oncology History Overview Note  # MARCH 2021- RUOQ- 4m- [BREAST, RIGHT, LATERAL POSTERIOR  - INVASIVE MAMMARY CARCINOMA, NO SPECIAL TYPE, ASSOCIATED WITH  CALCIFICATIONS.][place] ER-> 90%; PR- 50-90%; HER 2 FISH- POSITIVE  # S/P LUMPECTOMY [Dr.Cintron] pT1a [49m;pN0 [STAGE IA- ER/PRpoS; Her 2 POS]  . BREAST, RIGHT, LATERAL MIDDLE DEPTH (RIBBON-SHAPED CLIP):  STEREOTACTIC-GUIDED CORE BIOPSY: - DUCTAL CARCINOMA IN SITU (DCIS), HIGH-GRADE, ASSOCIATED WITH  CALCIFICATIONS.   # May 14th,2021- TH-H; s/p RT [finished OCT 2021]; NOV 23rd 2021- start Anastrazole  #Hypothyroidism-methimazole [Dr.Solum]  # SURVIVORSHIP:   # GENETICS:   DIAGNOSIS: Breast cancer  STAGE:   1      ;  GOALS: Cure  CURRENT/MOST RECENT THERAPY : Taxol Herceptin   Carcinoma of upper-outer quadrant of right breast in female, estrogen receptor positive ([place] 07/15/[place] Initial Diagnosis   Carcinoma of upper-outer quadrant of right breast in female, estrogen receptor positive ([facility]  08/28/2019 -  Chemotherapy    Patient is on Treatment Plan: BREAST WEEKLY PACLITAXEL / TRASTUZUMAB / MAINTENANCE TRASTUZUMAB EVERY 21 DAYS        HISTORY OF PRESENTING ILLNESS:  AnAlica Bradford [age].o.  female with breast cancer early stage-ER/PR positive HER-2/neu positive on adjuvant anastrozole/Herceptin maintenance is here for follow-up.  Patient denies any swelling of the legs .also denies any shortness of breath or cough.  No fevers or chills.  No joint pains.  Review of Systems  Constitutional: Positive for malaise/fatigue. Negative for chills, diaphoresis, fever and weight loss.  HENT: Negative  for nosebleeds and sore throat.   Eyes: Negative for double vision.  Respiratory: Negative for cough, hemoptysis, sputum production, shortness of breath and wheezing.   Cardiovascular: Negative for chest pain, palpitations, orthopnea and leg swelling.  Gastrointestinal: Negative for abdominal pain, blood in stool, constipation, diarrhea, heartburn, melena, nausea and vomiting.  Genitourinary: Negative for dysuria, frequency and urgency.  Musculoskeletal: Negative for back pain and joint pain.  Skin: Negative for itching.  Neurological: Negative for dizziness, focal weakness, weakness and headaches.  Endo/Heme/Allergies: Does not bruise/bleed easily.  Psychiatric/Behavioral: Negative for depression. The patient is not nervous/anxious and does not have insomnia.      MEDICAL HISTORY:  Past Medical History:  Diagnosis Date  . Atypical mole 09/26/2018   right mid anterior side/mild  . Basal cell carcinoma 08/25/2014   right med cheek infraorbital  . Breast cancer ([facility]   [facility] [place]  . Dyslipidemia   . Family history of bladder cancer   . Graves disease   . Hyperthyroidism   . Inborn lipid storage disorder   . Leiomyoma of uterus   . Vitamin D deficiency     SURGICAL HISTORY: Past Surgical History:  Procedure Laterality Date  . APPENDECTOMY  1982   Cyst removed from ovaries   . BREAST BIOPSY Right 07/05/2019   stereo bx, coil clip, IMC  . BREAST BIOPSY Right 07/05/2019   stereo bx, ribbon clip, DCIS   . BREAST LUMPECTOMY Right 07/31/2019    [facility]  . COLONOSCOPY WITH PROPOFOL N/A 01/20/2017   Procedure: COLONOSCOPY WITH PROPOFOL;  Surgeon: AnJonathon BellowsMD;  [facility];  Service: Gastroenterology;  Laterality: N/A;  . [place]  . NECK SURGERY  2013   LIft from weight loss  . PARTIAL MASTECTOMY WITH NEEDLE LOCALIZATION AND AXILLARY SENTINEL LYMPH NODE BX Right 07/31/2019   Procedure: RIGHT PARTIAL MASTECTOMY  WITH BRACKETED NEEDLE LOCALIZATION AND LEFT AXILLARY SENTINEL LYMPH NODE BIOPSY;  Surgeon: Herbert Pun, MD;  Location: [facility] ORS;  Service: General;  Laterality: Right;  . PORTACATH PLACEMENT Left 07/31/2019   Procedure: INSERTION PORT-A-CATH LEFT INTERNAL JUGULAR;  Surgeon: Herbert Pun, MD;  Location: [facility] ORS;  Service: General;  Laterality: Left;  . TONSILLECTOMY  1969    SOCIAL HISTORY: Social History   Socioeconomic History  . Marital status: Married    Spouse name: Not on file  . Number of children: 0  . Years of education: Anselm Lis  . Highest education level: Not on file  Occupational History  . Occupation: Full-Time    Comment: [employer]  Tobacco Use  . Smoking status: Former Research scientist (life sciences)  . Smokeless tobacco: Never Used  Vaping Use  . Vaping Use: Never used  Substance and Sexual Activity  . Alcohol use: Yes    Alcohol/week: 0.0 standard drinks    Comment: RARELY  . Drug use: No  . Sexual activity: Yes    Birth control/protection: Post-menopausal  Other Topics Concern  . Not on file  Social History Narrative   Lives close to [place]; with husband. Quit smoking in 2007 [25 years]; ocassional alcohol. 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    Social Determinants of Health   Financial Resource Strain: Not on file  Food Insecurity: Not on file  Transportation Needs: Not on file  Physical Activity: Not on file  Stress: Not on file  Social Connections: Not on file  Intimate Partner Violence: Not on file    FAMILY HISTORY: Family History  Problem Relation Age of Onset  . Bladder Cancer Mother   . Diabetes Father   . Heart disease Father   . Bladder Cancer Brother   . Breast cancer Neg Hx     ALLERGIES:  is allergic to penicillins.  MEDICATIONS:  Current Outpatient Medications  Medication Sig Dispense Refill  . anastrozole (ARIMIDEX) 1 MG tablet Take 1 tablet (1 mg total) by mouth daily. 90 tablet 1  . B COMPLEX VITAMINS PO Take 1 tablet by  mouth daily.     . chlorhexidine (PERIDEX) 0.12 % solution Use as directed 15 mLs in the mouth or throat 2 (two) times daily. 240 mL 0  . Cholecalciferol (VITAMIN D) 125 MCG (5000 UT) CAPS Take 5,000 Units by mouth daily.     . COLLAGEN PO Take 1 tablet by mouth daily. Super Collagen w/Biotin    . ibuprofen (ADVIL) 200 MG tablet Take 600 mg by mouth every 8 (eight) hours as needed (for pain.).     Marland Kitchen lidocaine-prilocaine (EMLA) cream Apply 1 application topically as needed. 30 g 0  . methimazole (TAPAZOLE) 5 MG tablet Take 5 mg by mouth daily.     . Multiple Vitamin (MULTIVITAMIN WITH MINERALS) TABS tablet Take 1 tablet by mouth daily.    . Omega 3-6-9 Fatty Acids (OMEGA 3-6-9 COMPLEX) CAPS Take 1 capsule by mouth daily.    . ondansetron (ZOFRAN) 8 MG tablet One pill every 8 hours as needed for nausea/vomitting. 40 tablet 1  . prochlorperazine (COMPAZINE) 10 MG tablet Take 1 tablet (10 mg total) by mouth every 6 (six) hours as needed for nausea or vomiting. 40 tablet  1  . valACYclovir (VALTREX) 500 MG tablet TAKE ONE TABLET BY MOUTH DAILY 30 tablet 11   No current facility-administered medications for this visit.    PHYSICAL EXAMINATION: ECOG PERFORMANCE STATUS: 0 - Asymptomatic  Vitals:   06/25/20 0906  BP: 128/62  Pulse: 65  Temp: 97.9 F (36.6 C)  SpO2: 100%   Filed Weights   06/25/20 0906  Weight: 180 lb 12.8 oz (82 kg)    Physical Exam HENT:     Head: Normocephalic and atraumatic.     Mouth/Throat:     Pharynx: No oropharyngeal exudate.  Eyes:     Pupils: Pupils are equal, round, and reactive to light.  Cardiovascular:     Rate and Rhythm: Normal rate and regular rhythm.  Pulmonary:     Effort: Pulmonary effort is normal. No respiratory distress.     Breath sounds: Normal breath sounds. No wheezing.  Abdominal:     General: Bowel sounds are normal. There is no distension.     Palpations: Abdomen is soft. There is no mass.     Tenderness: There is no abdominal  tenderness. There is no guarding or rebound.  Musculoskeletal:        General: No tenderness. Normal range of motion.     Cervical back: Normal range of motion and neck supple.  Skin:    General: Skin is warm.  Neurological:     Mental Status: She is alert and oriented to person, place, and time.  Psychiatric:        Mood and Affect: Affect normal.    LABORATORY DATA:  I have reviewed the data as listed Lab Results  Component Value Date   WBC 4.8 06/25/2020   HGB 12.1 06/25/2020   HCT 38.1 06/25/2020   MCV 89.4 06/25/2020   PLT 194 06/25/2020   Recent Labs    11/20/19 0903 12/18/19 0840 01/01/20 0859 01/22/20 0905 04/23/20 0827 05/14/20 0907 06/04/20 0854  NA 139 143 139   < > 140 139 141  K 4.0 4.1 4.1   < > 4.1 3.9 4.3  CL 107 109 104   < > 105 105 106  CO2 '24 24 24   ' < > '25 26 25  ' GLUCOSE 98 105* 106*   < > 108* 128* 123*  BUN 22* 14 16   < > '17 17 18  ' CREATININE 0.83 0.72 0.81   < > 0.76 0.85 0.69  CALCIUM 9.6 9.0 9.5   < > 9.4 9.4 9.0  GFRNONAA >60 >60 >60   < > >60 >60 >60  GFRAA >60 >60 >60  --   --   --   --   PROT 6.7 6.6 7.1   < > 6.9 6.8 6.8  ALBUMIN 4.1 4.0 4.2   < > 4.2 4.2 4.1  AST 32 36 39   < > 41 39 40  ALT 36 35 39   < > 48* 44 46*  ALKPHOS 72 76 76   < > 79 70 71  BILITOT 0.8 0.5 0.8   < > 0.5 0.7 0.7   < > = values in this interval not displayed.    RADIOGRAPHIC STUDIES: I have personally reviewed the radiological images as listed and agreed with the findings in the report. NM Cardiac [place] Rest  Result Date: 05/28/2020 CLINICAL DATA:  Breast cancer.  Evaluate left ventricular function. EXAM: NUCLEAR MEDICINE CARDIAC BLOOD POOL IMAGING (MUGA) TECHNIQUE: Cardiac multi-gated acquisition was performed at rest following intravenous  injection of Tc-55mlabeled red blood cells. RADIOPHARMACEUTICALS:  21.9 mCi Tc-919mertechnetate in-vitro labeled red blood cells IV COMPARISON:  01/20/2020 FINDINGS: The seen a images demonstrate normal left  ventricular wall motion. No akinetic or dyskinetic segments are identified. The left ventricular ejection fraction is estimated at 67.4%. This is unchanged since the prior study from 01/20/2020. IMPRESSION: Normal left ventricular wall motion with ejection fraction of 67.4%. Electronically Signed   By: P.Marijo Sanes.D.   On: 05/28/2020 16:07   ASSESSMENT & PLAN:   Carcinoma of upper-outer quadrant of right breast in female, estrogen receptor positive ([place]#Right breast-invasive mammary carcinoma- STAGE I- TRIPLE POSITIVE;currently on Herceptin maintenance. On Anastrazole. STABLE.   # Proceed with maintenance herceptin today; Labs today reviewed;  acceptable for treatment today. 10/04- MUGA scan- 64; FEB 2022- 67%.  Discussed that she will get Herceptin until May 2022.   # Right chest wall rash/Shingles-resolved; continue on Valtrex 500 mg once a day [sec prophy]; STABLE.  # SWOG study/PN study- PN- G-0-1. STABLE.    # Fatty liver- on [place]/ALT -grade 1 slightly elevated; STABLE; recommend weight loss/diet.   # OSTEOPENIA -BMD [ dec 2021-T-score of -1.2].  - on adjuvant zometa q 6 M [jan 6th,0222]; STABLE.   # Disposition:  # Herceptin today #  follow up in 3weeks- MD;[Thursday] labs- cbc/cmp; Herceptin; -Dr.B   All questions were answered. The patient/family knows to call the clinic with any problems, questions or concerns.    GoCammie SickleMD 06/25/2020 9:37 AM

## 2020-07-02 DIAGNOSIS — Z1211 Encounter for screening for malignant neoplasm of colon: Secondary | ICD-10-CM | POA: Diagnosis not present

## 2020-07-03 ENCOUNTER — Other Ambulatory Visit: Payer: Self-pay | Admitting: Obstetrics & Gynecology

## 2020-07-03 NOTE — Progress Notes (Signed)
Follow up discussion regarding recent genetic testing due to familial high risk factors.  Her lab genetic panel was negative.  Reassurance provided and counseling as to the pros and cons of testing was done today.  Since the current test is not perfect, it is possible that there may be a gene mutation that current testing cannot detect, but that chance is [place]. It is possible that a different genetic factor, which has not yet been discovered or is not on this panel, is responsible for the cancer diagnoses in the family. Again, the likelihood of this is low.   Most cancers happen by chance and this test, along with details of her family history, suggests that her cancer falls into this category. She is recommended to follow the cancer screening guidelines provided by her physician.  Connie Applebaum, MD, [facility], [facility] 07/03/2020  7:46 AM

## 2020-07-04 ENCOUNTER — Other Ambulatory Visit: Payer: Self-pay | Admitting: Oncology

## 2020-07-08 LAB — FECAL OCCULT BLOOD, IMMUNOCHEMICAL: Fecal Occult Bld: NEGATIVE

## 2020-07-16 ENCOUNTER — Other Ambulatory Visit: Payer: Self-pay

## 2020-07-16 ENCOUNTER — Telehealth: Payer: Self-pay | Admitting: Emergency Medicine

## 2020-07-16 ENCOUNTER — Inpatient Hospital Stay (HOSPITAL_BASED_OUTPATIENT_CLINIC_OR_DEPARTMENT_OTHER): Payer: 59 | Admitting: Internal Medicine

## 2020-07-16 ENCOUNTER — Inpatient Hospital Stay: Payer: 59

## 2020-07-16 ENCOUNTER — Encounter: Payer: Self-pay | Admitting: Internal Medicine

## 2020-07-16 DIAGNOSIS — C50411 Malignant neoplasm of upper-outer quadrant of right female breast: Secondary | ICD-10-CM

## 2020-07-16 DIAGNOSIS — Z170 Estrogen receptor positive status [ER+]: Secondary | ICD-10-CM

## 2020-07-16 DIAGNOSIS — Z5112 Encounter for antineoplastic immunotherapy: Secondary | ICD-10-CM | POA: Diagnosis not present

## 2020-07-16 LAB — COMPREHENSIVE METABOLIC PANEL
ALT: 59 U/L — ABNORMAL HIGH (ref 0–44)
AST: 52 U/L — ABNORMAL HIGH (ref 15–41)
Albumin: 4.2 g/dL (ref 3.5–5.0)
Alkaline Phosphatase: 62 U/L (ref 38–126)
Anion gap: 10 (ref 5–15)
BUN: 27 mg/dL — ABNORMAL HIGH (ref 6–20)
CO2: 24 mmol/L (ref 22–32)
Calcium: 9 mg/dL (ref 8.9–10.3)
Chloride: 106 mmol/L (ref 98–111)
Creatinine, Ser: 0.79 mg/dL (ref 0.44–1.00)
GFR, Estimated: >60 mL/min (ref >60)
Glucose, Bld: 116 mg/dL — ABNORMAL HIGH (ref 70–99)
Potassium: 4 mmol/L (ref 3.5–5.1)
Sodium: 140 mmol/L (ref 135–145)
Total Bilirubin: 0.9 mg/dL (ref 0.3–1.2)
Total Protein: 6.7 g/dL (ref 6.5–8.1)

## 2020-07-16 LAB — CBC WITH DIFFERENTIAL/PLATELET
Abs Immature Granulocytes: 0.03 10*3/uL (ref 0.00–0.07)
Basophils Absolute: 0.1 10*3/uL (ref 0.0–0.1)
Basophils Relative: 1 %
Eosinophils Absolute: 0.2 10*3/uL (ref 0.0–0.5)
Eosinophils Relative: 4 %
HCT: 37.5 % (ref 36.0–46.0)
Hemoglobin: 11.9 g/dL — ABNORMAL LOW (ref 12.0–15.0)
Immature Granulocytes: 1 %
Lymphocytes Relative: 24 %
Lymphs Abs: 1.2 10*3/uL (ref 0.7–4.0)
MCH: 28.1 pg (ref 26.0–34.0)
MCHC: 31.7 g/dL (ref 30.0–36.0)
MCV: 88.7 fL (ref 80.0–100.0)
Monocytes Absolute: 0.6 10*3/uL (ref 0.1–1.0)
Monocytes Relative: 12 %
Neutro Abs: 2.8 10*3/uL (ref 1.7–7.7)
Neutrophils Relative %: 58 %
Platelets: 208 10*3/uL (ref 150–400)
RBC: 4.23 MIL/uL (ref 3.87–5.11)
RDW: 13.4 % (ref 11.5–15.5)
WBC: 4.8 10*3/uL (ref 4.0–10.5)
nRBC: 0 % (ref 0.0–0.2)

## 2020-07-16 MED ORDER — TRASTUZUMAB-ANNS CHEMO 150 MG IV SOLR: 6.0000 mg/kg | Freq: Once | INTRAVENOUS | Status: DC

## 2020-07-16 MED ORDER — SODIUM CHLORIDE 0.9% FLUSH
10.0000 mL | Freq: Once | INTRAVENOUS | Status: AC
  Administered 2020-07-16: 10 mL via INTRAVENOUS
  Filled 2020-07-16: qty 10

## 2020-07-16 MED ORDER — ACETAMINOPHEN 325 MG PO TABS
650.0000 mg | ORAL_TABLET | Freq: Once | ORAL | Status: AC
  Administered 2020-07-16: 650 mg via ORAL
  Filled 2020-07-16: qty 2

## 2020-07-16 MED ORDER — TRASTUZUMAB-ANNS CHEMO 150 MG IV SOLR
450.0000 mg | Freq: Once | INTRAVENOUS | Status: AC
  Administered 2020-07-16: 450 mg via INTRAVENOUS
  Filled 2020-07-16: qty 21.43

## 2020-07-16 MED ORDER — DIPHENHYDRAMINE HCL 25 MG PO CAPS
25.0000 mg | ORAL_CAPSULE | Freq: Once | ORAL | Status: AC
  Administered 2020-07-16: 25 mg via ORAL
  Filled 2020-07-16: qty 1

## 2020-07-16 MED ORDER — SODIUM CHLORIDE 0.9 % IV SOLN
Freq: Once | INTRAVENOUS | Status: AC
Start: 2020-07-16 — End: 2020-07-16
  Filled 2020-07-16: qty 250

## 2020-07-16 MED ORDER — HEPARIN SOD (PORK) LOCK FLUSH 100 UNIT/ML IV SOLN
500.0000 [IU] | Freq: Once | INTRAVENOUS | Status: AC
  Administered 2020-07-16: 500 [IU] via INTRAVENOUS
  Filled 2020-07-16: qty 5

## 2020-07-16 NOTE — Progress Notes (Signed)
one [place] NOTE  Patient Care Team: Birdie Sons, MD as PCP - General (Family Medicine) [facility] Gyn (Gynecology) Theodore Demark, RN as Oncology Nurse Navigator Cammie Sickle, MD as Consulting Physician (Hematology and Oncology)  CHIEF COMPLAINTS/PURPOSE OF CONSULTATION: Breast cancer  #  Oncology History Overview Note  # MARCH 2021- RUOQ- 36m- [BREAST, RIGHT, LATERAL POSTERIOR  - INVASIVE MAMMARY CARCINOMA, NO SPECIAL TYPE, ASSOCIATED WITH  CALCIFICATIONS.][place] ER-> 90%; PR- 50-90%; HER 2 FISH- POSITIVE  # S/P LUMPECTOMY [Dr.Cintron] pT1a [468m;pN0 [STAGE IA- ER/PRpoS; Her 2 POS]  . BREAST, RIGHT, LATERAL MIDDLE DEPTH (RIBBON-SHAPED CLIP):  STEREOTACTIC-GUIDED CORE BIOPSY: - DUCTAL CARCINOMA IN SITU (DCIS), HIGH-GRADE, ASSOCIATED WITH  CALCIFICATIONS.   # May 14th,2021- TH-H; s/p RT [finished OCT 2021]; NOV 23rd 2021- start Anastrazole  #Hypothyroidism-methimazole [Dr.Solum]  # SURVIVORSHIP:   # GENETICS:   DIAGNOSIS: Breast cancer  STAGE:   1      ;  GOALS: Cure  CURRENT/MOST RECENT THERAPY : Taxol Herceptin   Carcinoma of upper-outer quadrant of right breast in female, estrogen receptor positive ([place] 07/15/2019 Initial Diagnosis   Carcinoma of upper-outer quadrant of right breast in female, estrogen receptor positive ([facility] [place]  08/28/2019 -  Chemotherapy    Patient is on Treatment Plan: BREAST WEEKLY PACLITAXEL / TRASTUZUMAB / MAINTENANCE TRASTUZUMAB EVERY 21 DAYS        HISTORY OF PRESENTING ILLNESS:  [name] [age].o.  female with breast cancer early stage-ER/PR positive HER-2/neu positive on adjuvant anastrozole/Herceptin maintenance is here for follow-up.  Patient denies any swelling in the legs.  denies any nausea vomiting.  No fevers or chills.  No joint pains.  No headaches.  Review of Systems  Constitutional: Positive for malaise/fatigue. Negative for chills, diaphoresis, fever and weight loss.  HENT: Negative for  nosebleeds and sore throat.   Eyes: Negative for double vision.  Respiratory: Negative for cough, hemoptysis, sputum production, shortness of breath and wheezing.   Cardiovascular: Negative for chest pain, palpitations, orthopnea and leg swelling.  Gastrointestinal: Negative for abdominal pain, blood in stool, constipation, diarrhea, heartburn, melena, nausea and vomiting.  Genitourinary: Negative for dysuria, frequency and urgency.  Musculoskeletal: Negative for back pain and joint pain.  Skin: Negative for itching.  Neurological: Negative for dizziness, focal weakness, weakness and headaches.  Endo/Heme/Allergies: Does not bruise/bleed easily.  Psychiatric/Behavioral: Negative for depression. The patient is not nervous/anxious and does not have insomnia.      MEDICAL HISTORY:  Past Medical History:  Diagnosis Date  . Atypical mole 09/26/2018   right mid anterior side/mild  . Basal cell carcinoma 08/25/2014   right med cheek infraorbital  . Breast cancer ([place]04/2021   [place] DCIS  . Dyslipidemia   . Family history of bladder cancer   . Graves disease   . Hyperthyroidism   . Inborn lipid storage disorder   . Leiomyoma of uterus   . Vitamin D deficiency     SURGICAL HISTORY: Past Surgical History:  Procedure Laterality Date  . APPENDECTOMY  1982   Cyst removed from ovaries   . BREAST BIOPSY Right 07/05/2019   stereo bx, coil clip, IMC  . BREAST BIOPSY Right 07/05/2019   stereo bx, ribbon clip, DCIS   . BREAST LUMPECTOMY Right 07/31/2019    [facility]  . COLONOSCOPY WITH PROPOFOL N/A 01/20/2017   Procedure: COLONOSCOPY WITH PROPOFOL;  Surgeon: AnJonathon BellowsMD;  [facility];  Service: Gastroenterology;  Laterality: N/A;  . [place]  . NECK SURGERY  2013   LIft from weight loss  . PARTIAL MASTECTOMY WITH NEEDLE LOCALIZATION AND AXILLARY SENTINEL LYMPH NODE BX Right 07/31/2019   Procedure: RIGHT PARTIAL MASTECTOMY WITH  BRACKETED NEEDLE LOCALIZATION AND LEFT AXILLARY SENTINEL LYMPH NODE BIOPSY;  Surgeon: Herbert Pun, MD;  Location: [facility];  Service: General;  Laterality: Right;  . PORTACATH PLACEMENT Left 07/31/2019   Procedure: INSERTION PORT-A-CATH LEFT INTERNAL JUGULAR;  Surgeon: Herbert Pun, MD;  Location: [facility]S;  Service: General;  Laterality: Left;  . TONSILLECTOMY  1969    SOCIAL HISTORY: Social History   Socioeconomic History  . Marital status: Married    Spouse name: Not on file  . Number of children: 0  . Years of education: Anselm Lis  . Highest education level: Not on file  Occupational History  . Occupation: Full-Time    Comment: [employer] x9  Tobacco Use  . Smoking status: Former Research scientist (life sciences)  . Smokeless tobacco: Never Used  Vaping Use  . Vaping Use: Never used  Substance and Sexual Activity  . Alcohol use: Yes    Alcohol/week: 0.0 standard drinks    Comment: RARELY  . Drug use: No  . Sexual activity: Yes    Birth control/protection: Post-menopausal  Other Topics Concern  . Not on file  Social History Narrative   Lives close to [place]; with husband. Quit smoking in 2007 [25 years]; ocassional alcohol. 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    Social Determinants of Health   Financial Resource Strain: Not on file  Food Insecurity: Not on file  Transportation Needs: Not on file  Physical Activity: Not on file  Stress: Not on file  Social Connections: Not on file  Intimate Partner Violence: Not on file    FAMILY HISTORY: Family History  Problem Relation Age of Onset  . Bladder Cancer Mother   . Diabetes Father   . Heart disease Father   . Bladder Cancer Brother   . Breast cancer Neg Hx     ALLERGIES:  is allergic to penicillins.  MEDICATIONS:  Current Outpatient Medications  Medication Sig Dispense Refill  . anastrozole (ARIMIDEX) 1 MG tablet Take 1 tablet (1 mg total) by mouth daily. 90 tablet 1  . B COMPLEX VITAMINS PO Take 1 tablet by  mouth daily.     . chlorhexidine (PERIDEX) 0.12 % solution USE 15MLS IN THE MOUTH OR THROAT TWICE DAILY AS DIRECTED 473 mL 0  . Cholecalciferol (VITAMIN D) 125 MCG (5000 UT) CAPS Take 5,000 Units by mouth daily.     . COLLAGEN PO Take 1 tablet by mouth daily. Super Collagen w/Biotin    . ibuprofen (ADVIL) 200 MG tablet Take 600 mg by mouth every 8 (eight) hours as needed (for pain.).     Marland Kitchen lidocaine-prilocaine (EMLA) cream Apply 1 application topically as needed. 30 g 0  . methimazole (TAPAZOLE) 5 MG tablet Take 5 mg by mouth daily.     . Multiple Vitamin (MULTIVITAMIN WITH MINERALS) TABS tablet Take 1 tablet by mouth daily.    . Omega 3-6-9 Fatty Acids (OMEGA 3-6-9 COMPLEX) CAPS Take 1 capsule by mouth daily.    . ondansetron (ZOFRAN) 8 MG tablet One pill every 8 hours as needed for nausea/vomitting. 40 tablet 1  . prochlorperazine (COMPAZINE) 10 MG tablet Take 1 tablet (10 mg total) by mouth every 6 (six) hours as needed for nausea or vomiting. 40 tablet 1  .  valACYclovir (VALTREX) 500 MG tablet TAKE ONE TABLET BY MOUTH DAILY 30 tablet 11   No current facility-administered medications for this visit.   Facility-Administered Medications Ordered in Other Visits  Medication Dose Route Frequency Provider Last Rate Last Admin  . heparin lock flush 100 unit/mL  500 Units Intravenous Once [name] R, MD      . Theotis Burrow [facility]) 450 mg in sodium chloride 0.9 % 250 mL chemo infusion  450 mg Intravenous Once Cammie Sickle, MD        PHYSICAL EXAMINATION: ECOG PERFORMANCE STATUS: 0 - Asymptomatic  Vitals:   07/16/20 0912  BP: 133/67  Pulse: 75  Resp: 16  Temp: (!) 96.4 F (35.8 C)  SpO2: 100%   Filed Weights   07/16/20 0912  Weight: 181 lb (82.1 kg)    Physical Exam HENT:     Head: Normocephalic and atraumatic.     Mouth/Throat:     Pharynx: No oropharyngeal exudate.  Eyes:     Pupils: Pupils are equal, round, and reactive to light.  Cardiovascular:      Rate and Rhythm: Normal rate and regular rhythm.  Pulmonary:     Effort: Pulmonary effort is normal. No respiratory distress.     Breath sounds: Normal breath sounds. No wheezing.  Abdominal:     General: Bowel sounds are normal. There is no distension.     Palpations: Abdomen is soft. There is no mass.     Tenderness: There is no abdominal tenderness. There is no guarding or rebound.  Musculoskeletal:        General: No tenderness. Normal range of motion.     Cervical back: Normal range of motion and neck supple.  Skin:    General: Skin is warm.  Neurological:     Mental Status: She is alert and oriented to person, place, and time.  Psychiatric:        Mood and Affect: Affect normal.    LABORATORY DATA:  I have reviewed the data as listed Lab Results  Component Value Date   WBC 4.8 07/16/2020   HGB 11.9 (L) 07/16/2020   HCT 37.5 07/16/2020   MCV 88.7 07/16/2020   PLT 208 07/16/2020   Recent Labs    11/20/19 0903 12/18/19 0840 01/01/20 0859 01/22/20 0905 06/04/20 0854 06/25/20 0849 07/16/20 0858  NA 139 143 139   < > 141 141 140  K 4.0 4.1 4.1   < > 4.3 4.1 4.0  CL 107 109 104   < > 106 108 106  CO2 '24 24 24   ' < > '25 25 24  ' GLUCOSE 98 105* 106*   < > 123* 109* 116*  BUN 22* 14 16   < > 18 17 27*  CREATININE 0.83 0.72 0.81   < > 0.69 0.73 0.79  CALCIUM 9.6 9.0 9.5   < > 9.0 9.4 9.0  GFRNONAA >60 >60 >60   < > >60 >60 >60  GFRAA >60 >60 >60  --   --   --   --   PROT 6.7 6.6 7.1   < > 6.8 6.9 6.7  ALBUMIN 4.1 4.0 4.2   < > 4.1 4.2 4.2  AST 32 36 39   < > 40 46* 52*  ALT 36 35 39   < > 46* 45* 59*  ALKPHOS 72 76 76   < > 71 68 62  BILITOT 0.8 0.5 0.8   < > 0.7 0.8 0.9   < > =  values in this interval not displayed.    RADIOGRAPHIC STUDIES: I have personally reviewed the radiological images as listed and agreed with the findings in the report. No results found. ASSESSMENT & PLAN:   Carcinoma of upper-outer quadrant of right breast in female, estrogen receptor  positive ([place]) #Right breast-invasive mammary carcinoma- STAGE I- TRIPLE POSITIVE;currently on Herceptin maintenance. On Anastrazole. STABLE  # Proceed with maintenance herceptin today; Labs today reviewed;  acceptable for treatment today. 10/04- MUGA scan- 64; FEB 2022- 67%.  Discussed that she will get Herceptin until May 2022.   # Right chest wall rash/Shingles-resolved; continue on Valtrex 500 mg once a day [sec prophy]; STABLE  # SWOG study/PN study- PN- G-0-1. STABLE.    # Fatty liver- on [place]- AST/ALT [52/59] -grade 1 slightly elevated; STABLE; recommend weight loss/diet.   # OSTEOPENIA -BMD [ dec 2021-T-score of -1.2].  - on adjuvant zometa q 6 M [jan 6th,0222]; STABLE.   # Disposition:  # Herceptin today #  follow up in 4 weeks [vac]- MD;labs- cbc/cmp; Herceptin; -Dr.B   All questions were answered. The patient/family knows to call the clinic with any problems, questions or concerns.    Cammie Sickle, MD 07/16/2020 10:29 AM

## 2020-07-16 NOTE — Progress Notes (Signed)
Insurance changing to [place] on 07/17/20

## 2020-07-16 NOTE — Telephone Encounter (Signed)
S1714 - A Prospective Observational Cohort Study to Develop a Predictive Model of Taxane-Induced Peripheral Neuropathy in Cancer Patients  07/16/20  Called patient to schedule 52-week research visit with neuropathy assessment.  Patient agreed to come in at 8:15am on 08/13/20 prior to her already scheduled appointments.  Patient denied any questions at this time.  She was given this research coordinator's contact information for any questions.  Clabe Seal Clinical Research Coordinator I  07/16/20  2:54 PM

## 2020-07-16 NOTE — Assessment & Plan Note (Signed)
#  Right breast-invasive mammary carcinoma- STAGE I- TRIPLE POSITIVE;currently on Herceptin maintenance. On Anastrazole. STABLE  # Proceed with maintenance herceptin today; Labs today reviewed;  acceptable for treatment today. 10/04- MUGA scan- 64; FEB 2022- 67%.  Discussed that she will get Herceptin until May 2022.   # Right chest wall rash/Shingles-resolved; continue on Valtrex 500 mg once a day [sec prophy]; STABLE  # SWOG study/PN study- PN- G-0-1. STABLE.    # Fatty liver- on [place]- AST/ALT [52/59] -grade 1 slightly elevated; STABLE; recommend weight loss/diet.   # OSTEOPENIA -BMD [ dec 2021-T-score of -1.2].  - on adjuvant zometa q 6 M [jan 6th,0222]; STABLE.   # Disposition:  # Herceptin today #  follow up in 4 weeks [vac]- MD;labs- cbc/cmp; Herceptin; -Dr.B

## 2020-07-20 ENCOUNTER — Ambulatory Visit
Admission: RE | Admit: 2020-07-20 | Discharge: 2020-07-20 | Disposition: A | Discharge status: Home / Self Care | Payer: Managed Care, Other (non HMO) | Source: Ambulatory Visit | Attending: General Surgery | Admitting: General Surgery

## 2020-07-20 ENCOUNTER — Other Ambulatory Visit: Payer: Self-pay

## 2020-07-20 DIAGNOSIS — Z853 Personal history of malignant neoplasm of breast: Secondary | ICD-10-CM

## 2020-07-30 ENCOUNTER — Encounter: Payer: Self-pay | Admitting: Internal Medicine

## 2020-07-30 ENCOUNTER — Ambulatory Visit
Admission: RE | Admit: 2020-07-30 | Discharge: 2020-07-30 | Disposition: A | Discharge status: Home / Self Care | Payer: Managed Care, Other (non HMO) | Source: Ambulatory Visit | Attending: Radiation Oncology | Admitting: Radiation Oncology

## 2020-07-30 ENCOUNTER — Encounter: Payer: Self-pay | Admitting: Radiation Oncology

## 2020-07-30 VITALS — BP 149/67 | HR 68 | Temp 96.2°F | Wt 179.3 lb

## 2020-07-30 DIAGNOSIS — C50411 Malignant neoplasm of upper-outer quadrant of right female breast: Secondary | ICD-10-CM

## 2020-07-30 NOTE — Progress Notes (Signed)
Radiation Oncology Follow up Note  Name: Connie Bradford   Date:   07/30/2020 MRN:  [id number] DOB: 1960/09/21    This 60 y.o. female presents to the clinic today for 64-month follow-up status post whole breast radiation to her right breast for stage Ia triple positive invasive mammary carcinoma.  REFERRING PROVIDER: Birdie Sons, MD  HPI: Patient is a 60 year old female now out 6 months having completed whole breast radiation to her right breast for stage Ia triple positive invasive mammary carcinoma.  Seen today in routine follow-up she is doing well.  She specifically denies breast tenderness cough or bone pain..  Imaging this month which I have reviewed shows a group of dystrophic appearing calcifications spanning 3 mm.  These are probably benign although repeat mammogram has been ordered in about 3 months.  She is currently on Arimidex tolerant well without side effect.  COMPLICATIONS OF TREATMENT: none  FOLLOW UP COMPLIANCE: keeps appointments   PHYSICAL EXAM:  BP (!) 149/67   Pulse 68   Temp (!) 96.2 F (35.7 C) (Tympanic)   Wt 179 lb 4.8 oz (81.3 kg)   LMP 12/18/2013   BMI 28.94 kg/m  Lungs are clear to A&P cardiac examination essentially unremarkable with regular rate and rhythm. No dominant mass or nodularity is noted in either breast in 2 positions examined. Incision is well-healed. No axillary or supraclavicular adenopathy is appreciated. Cosmetic result is excellent.  Well-developed well-nourished patient in NAD. HEENT reveals PERLA, EOMI, discs not visualized.  Oral cavity is clear. No oral mucosal lesions are identified. Neck is clear without evidence of cervical or supraclavicular adenopathy. Lungs are clear to A&P. Cardiac examination is essentially unremarkable with regular rate and rhythm without murmur rub or thrill. Abdomen is benign with no organomegaly or masses noted. Motor sensory and DTR levels are equal and symmetric in the upper and lower extremities. Cranial  nerves II through XII are grossly intact. Proprioception is intact. No peripheral adenopathy or edema is identified. No motor or sensory levels are noted. Crude visual fields are within normal range.  RADIOLOGY RESULTS: Mammograms reviewed compatible with above-stated findings  PLAN: Present time she is doing well.  We will follow up on her repeat mammograms in about 3 months.  Otherwise I am pleased with her overall progress.  She continues on Arimidex without side effect.  I have asked to see her back in 6 months and then will start once year follow-up visits.  Patient knows to call with any concerns.  I would like to take this opportunity to thank you for allowing me to participate in the care of your patient.Noreene Filbert, MD

## 2020-08-13 ENCOUNTER — Inpatient Hospital Stay: Payer: Managed Care, Other (non HMO) | Attending: Internal Medicine

## 2020-08-13 ENCOUNTER — Inpatient Hospital Stay: Payer: Managed Care, Other (non HMO)

## 2020-08-13 ENCOUNTER — Encounter: Payer: Self-pay | Admitting: Internal Medicine

## 2020-08-13 ENCOUNTER — Other Ambulatory Visit: Payer: Self-pay

## 2020-08-13 ENCOUNTER — Inpatient Hospital Stay (HOSPITAL_BASED_OUTPATIENT_CLINIC_OR_DEPARTMENT_OTHER): Payer: Managed Care, Other (non HMO) | Admitting: Internal Medicine

## 2020-08-13 VITALS — Resp 18

## 2020-08-13 VITALS — BP 138/66 | HR 66 | Temp 96.8°F | Wt 180.4 lb

## 2020-08-13 DIAGNOSIS — C50411 Malignant neoplasm of upper-outer quadrant of right female breast: Secondary | ICD-10-CM | POA: Diagnosis not present

## 2020-08-13 DIAGNOSIS — Z5112 Encounter for antineoplastic immunotherapy: Secondary | ICD-10-CM | POA: Insufficient documentation

## 2020-08-13 DIAGNOSIS — Z170 Estrogen receptor positive status [ER+]: Secondary | ICD-10-CM | POA: Insufficient documentation

## 2020-08-13 DIAGNOSIS — R5383 Other fatigue: Secondary | ICD-10-CM | POA: Diagnosis not present

## 2020-08-13 LAB — COMPREHENSIVE METABOLIC PANEL
ALT: 68 U/L — ABNORMAL HIGH (ref 0–44)
AST: 56 U/L — ABNORMAL HIGH (ref 15–41)
Albumin: 4.2 g/dL (ref 3.5–5.0)
Alkaline Phosphatase: 57 U/L (ref 38–126)
Anion gap: 12 (ref 5–15)
BUN: 20 mg/dL (ref 6–20)
CO2: 25 mmol/L (ref 22–32)
Calcium: 9.4 mg/dL (ref 8.9–10.3)
Chloride: 104 mmol/L (ref 98–111)
Creatinine, Ser: 0.96 mg/dL (ref 0.44–1.00)
GFR, Estimated: >60 mL/min (ref >60)
Glucose, Bld: 157 mg/dL — ABNORMAL HIGH (ref 70–99)
Potassium: 4.1 mmol/L (ref 3.5–5.1)
Sodium: 141 mmol/L (ref 135–145)
Total Bilirubin: 1 mg/dL (ref 0.3–1.2)
Total Protein: 6.9 g/dL (ref 6.5–8.1)

## 2020-08-13 LAB — CBC WITH DIFFERENTIAL/PLATELET
Abs Immature Granulocytes: 0.03 10*3/uL (ref 0.00–0.07)
Basophils Absolute: 0.1 10*3/uL (ref 0.0–0.1)
Basophils Relative: 2 %
Eosinophils Absolute: 0.1 10*3/uL (ref 0.0–0.5)
Eosinophils Relative: 3 %
HCT: 38.1 % (ref 36.0–46.0)
Hemoglobin: 12.2 g/dL (ref 12.0–15.0)
Immature Granulocytes: 1 %
Lymphocytes Relative: 22 %
Lymphs Abs: 0.9 10*3/uL (ref 0.7–4.0)
MCH: 28.4 pg (ref 26.0–34.0)
MCHC: 32 g/dL (ref 30.0–36.0)
MCV: 88.8 fL (ref 80.0–100.0)
Monocytes Absolute: 0.4 10*3/uL (ref 0.1–1.0)
Monocytes Relative: 10 %
Neutro Abs: 2.6 10*3/uL (ref 1.7–7.7)
Neutrophils Relative %: 62 %
Platelets: 194 10*3/uL (ref 150–400)
RBC: 4.29 MIL/uL (ref 3.87–5.11)
RDW: 13.8 % (ref 11.5–15.5)
WBC: 4.2 10*3/uL (ref 4.0–10.5)
nRBC: 0 % (ref 0.0–0.2)

## 2020-08-13 LAB — BRAIN NATRIURETIC PEPTIDE: B Natriuretic Peptide: 11.3 pg/mL (ref 0.0–100.0)

## 2020-08-13 MED ORDER — HEPARIN SOD (PORK) LOCK FLUSH 100 UNIT/ML IV SOLN
INTRAVENOUS | Status: AC
  Filled 2020-08-13: qty 5

## 2020-08-13 MED ORDER — TRASTUZUMAB-ANNS CHEMO 150 MG IV SOLR: 6.0000 mg/kg | Freq: Once | INTRAVENOUS | Status: DC

## 2020-08-13 MED ORDER — ACETAMINOPHEN 325 MG PO TABS
650.0000 mg | ORAL_TABLET | Freq: Once | ORAL | Status: AC
  Administered 2020-08-13: 650 mg via ORAL
  Filled 2020-08-13: qty 2

## 2020-08-13 MED ORDER — SODIUM CHLORIDE 0.9 % IV SOLN
Freq: Once | INTRAVENOUS | Status: AC
  Filled 2020-08-13: qty 250

## 2020-08-13 MED ORDER — SODIUM CHLORIDE 0.9% FLUSH
10.0000 mL | Freq: Once | INTRAVENOUS | Status: AC
  Administered 2020-08-13: 10 mL via INTRAVENOUS
  Filled 2020-08-13: qty 10

## 2020-08-13 MED ORDER — DIPHENHYDRAMINE HCL 25 MG PO CAPS
25.0000 mg | ORAL_CAPSULE | Freq: Once | ORAL | Status: AC
Start: 2020-08-13 — End: 2020-08-13
  Administered 2020-08-13: 25 mg via ORAL
  Filled 2020-08-13: qty 1

## 2020-08-13 MED ORDER — HEPARIN SOD (PORK) LOCK FLUSH 100 UNIT/ML IV SOLN
500.0000 [IU] | Freq: Once | INTRAVENOUS | Status: AC
  Administered 2020-08-13: 500 [IU] via INTRAVENOUS
  Filled 2020-08-13: qty 5

## 2020-08-13 MED ORDER — ANASTROZOLE 1 MG PO TABS: 1.0000 mg | ORAL_TABLET | Freq: Every day | ORAL | 1 refills | Status: DC

## 2020-08-13 MED ORDER — SODIUM CHLORIDE 0.9 % IV SOLN
450.0000 mg | Freq: Once | INTRAVENOUS | Status: AC
Start: 2020-08-13 — End: 2020-08-13
  Administered 2020-08-13: 450 mg via INTRAVENOUS
  Filled 2020-08-13: qty 21.43

## 2020-08-13 NOTE — Assessment & Plan Note (Addendum)
#  Right breast-invasive mammary carcinoma- STAGE I- TRIPLE POSITIVE;currently on Herceptin maintenance. On Anastrazole. STABLE.  New prescription sent.  Mammogram April 2022-likely postoperative changes; diagnostic mammogram in 6 months.  # Proceed with maintenance herceptin today; Labs today reviewed;  acceptable for treatment today. 10/04- MUGA scan- 64; FEB 2022- 67%.  Pt has one more infusion in  May 2022.   # Right chest wall rash/Shingles-resolved; continue on Valtrex 500 mg once a day [sec prophy]; STABLE  # SWOG study/PN study- PN- G-0; research data filled out.   # Fatty liver- on [place]- AST/ALT [56/68] -grade 1 slightly elevated; STABLE; recommend weight loss/diet.   # OSTEOPENIA -BMD [ dec 2021-T-score of -1.2].- on adjuvant zometa q 6 M [jan 6th,0222]; STABLE.   # mild ankle swelling- likley dependant; clinically not suggestive of CHF.  Check BNP  # port- discussed re: port ex-plantation; will keep for now for zometa  # Disposition: ADD BNP # Herceptin today #  follow up in 3 weeks X-MD;labs- cbc/cmp; Herceptin;  # follow up in  2nd week of June 2022- MD; labs- cmp; zometa- Dr.B

## 2020-08-13 NOTE — Progress Notes (Signed)
[facility] [place] NOTE  Patient Care Team: Birdie Sons, MD as PCP - General (Family Medicine) [facility] (Gynecology) Theodore Demark, RN as Oncology Nurse Navigator Cammie Sickle, MD as Consulting Physician (Hematology and Oncology)  CHIEF COMPLAINTS/PURPOSE OF CONSULTATION: Breast cancer  #  Oncology History Overview Note  # MARCH 2021- RUOQ- 30m- [BREAST, RIGHT, LATERAL POSTERIOR  - INVASIVE MAMMARY CARCINOMA, NO SPECIAL TYPE, ASSOCIATED WITH  CALCIFICATIONS.][place] ER-> 90%; PR- 50-90%; HER 2 FISH- POSITIVE  # S/P LUMPECTOMY [Dr.Cintron] pT1a [469m;pN0 [STAGE IA- ER/PRpoS; Her 2 POS]  . BREAST, RIGHT, LATERAL MIDDLE DEPTH (RIBBON-SHAPED CLIP):  STEREOTACTIC-GUIDED CORE BIOPSY: - DUCTAL CARCINOMA IN SITU (DCIS), HIGH-GRADE, ASSOCIATED WITH  CALCIFICATIONS.   # May 14th,2021- TH-H; s/p RT [finished OCT 2021]; NOV 23rd 2021- start Anastrazole  #Hypothyroidism-methimazole [Dr.Solum]  # SURVIVORSHIP:   # GENETICS:   DIAGNOSIS: Breast cancer  STAGE:   1      ;  GOALS: Cure  CURRENT/MOST RECENT THERAPY : Taxol Herceptin   Carcinoma of upper-outer quadrant of right breast in female, estrogen receptor positive ([facility] 07/[place] Initial Diagnosis   Carcinoma of upper-outer quadrant of right breast in female, estrogen receptor positive ([facility]  08/28/2019 -  Chemotherapy    Patient is on Treatment Plan: BREAST WEEKLY PACLITAXEL / TRASTUZUMAB / MAINTENANCE TRASTUZUMAB EVERY 21 DAYS        HISTORY OF PRESENTING ILLNESS:  AnCanisha Broughton 89.o.  female with breast cancer early stage-ER/PR positive HER-2/neu positive on adjuvant anastrozole/Herceptin maintenance is here for follow-up.  Complains of mild swelling in the legs/ankles.  No new shortness of breath or cough.  No tingling or numbness.  No joint pains.  Review of Systems  Constitutional: Positive for malaise/fatigue. Negative for chills, diaphoresis, fever and weight loss.  HENT: Negative  for nosebleeds and sore throat.   Eyes: Negative for double vision.  Respiratory: Negative for cough, hemoptysis, sputum production, shortness of breath and wheezing.   Cardiovascular: Positive for leg swelling. Negative for chest pain, palpitations and orthopnea.  Gastrointestinal: Negative for abdominal pain, blood in stool, constipation, diarrhea, heartburn, melena, nausea and vomiting.  Genitourinary: Negative for dysuria, frequency and urgency.  Musculoskeletal: Negative for back pain and joint pain.  Skin: Negative for itching.  Neurological: Negative for dizziness, focal weakness, weakness and headaches.  Endo/Heme/Allergies: Does not bruise/bleed easily.  Psychiatric/Behavioral: Negative for depression. The patient is not nervous/anxious and does not have insomnia.      MEDICAL HISTORY:  Past Medical History:  Diagnosis Date  . Atypical mole 09/26/2018   right mid anterior side/mild  . Basal cell carcinoma 08/25/2014   right med cheek infraorbital  . Breast cancer ([facility]   [facility] [place]  . Dyslipidemia   . Family history of bladder cancer   . Graves disease   . Hyperthyroidism   . Inborn lipid storage disorder   . Leiomyoma of uterus   . Personal history of chemotherapy 2021   RIGHT lumpectomy  . Personal history of radiation therapy 2021   RIGHT lumpectomy  . Vitamin D deficiency     SURGICAL HISTORY: Past Surgical History:  Procedure Laterality Date  . APPENDECTOMY  1982   Cyst removed from ovaries   . BREAST BIOPSY Right 07/05/2019   stereo bx, coil clip, IMC  . BREAST BIOPSY Right 07/05/2019   stereo bx, ribbon clip, DCIS   . BREAST LUMPECTOMY Right 07/31/2019    [facility]  .  COLONOSCOPY WITH PROPOFOL N/A 01/20/2017   Procedure: COLONOSCOPY WITH PROPOFOL;  Surgeon: Jonathon Bellows, MD;  Location: [facility] ENDOSCOPY;  Service: Gastroenterology;  Laterality: N/A;  . [place]  . NECK SURGERY  2013   LIft from weight  loss  . PARTIAL MASTECTOMY WITH NEEDLE LOCALIZATION AND AXILLARY SENTINEL LYMPH NODE BX Right 07/31/2019   Procedure: RIGHT PARTIAL MASTECTOMY WITH BRACKETED NEEDLE LOCALIZATION AND LEFT AXILLARY SENTINEL LYMPH NODE BIOPSY;  Surgeon: Herbert Pun, MD;  Location: [facility] ORS;  Service: General;  Laterality: Right;  . PORTACATH PLACEMENT Left 07/31/2019   Procedure: INSERTION PORT-A-CATH LEFT INTERNAL JUGULAR;  Surgeon: Herbert Pun, MD;  Location: [facility] ORS;  Service: General;  Laterality: Left;  . TONSILLECTOMY  1969    SOCIAL HISTORY: Social History   Socioeconomic History  . Marital status: Married    Spouse name: Not on file  . Number of children: 0  . Years of education: Anselm Lis  . Highest education level: Not on file  Occupational History  . Occupation: Full-Time    Comment: [employer] x9  Tobacco Use  . Smoking status: Former Research scientist (life sciences)  . Smokeless tobacco: Never Used  Vaping Use  . Vaping Use: Never used  Substance and Sexual Activity  . Alcohol use: Yes    Alcohol/week: 0.0 standard drinks    Comment: RARELY  . Drug use: No  . Sexual activity: Yes    Birth control/protection: Post-menopausal  Other Topics Concern  . Not on file  Social History Narrative   Lives close to [place]; with husband. Quit smoking in 2007 [25 years]; ocassional alcohol. 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    Social Determinants of Health   Financial Resource Strain: Not on file  Food Insecurity: Not on file  Transportation Needs: Not on file  Physical Activity: Not on file  Stress: Not on file  Social Connections: Not on file  Intimate Partner Violence: Not on file    FAMILY HISTORY: Family History  Problem Relation Age of Onset  . Bladder Cancer Mother   . Diabetes Father   . Heart disease Father   . Bladder Cancer Brother   . Breast cancer Neg Hx     ALLERGIES:  is allergic to penicillins.  MEDICATIONS:  Current Outpatient Medications  Medication Sig Dispense  Refill  . anastrozole (ARIMIDEX) 1 MG tablet Take 1 tablet (1 mg total) by mouth daily. 90 tablet 1  . B COMPLEX VITAMINS PO Take 1 tablet by mouth daily.     . chlorhexidine (PERIDEX) 0.12 % solution USE 15MLS IN THE MOUTH OR THROAT TWICE DAILY AS DIRECTED 473 mL 0  . Cholecalciferol (VITAMIN D) 125 MCG (5000 UT) CAPS Take 5,000 Units by mouth daily.     . COLLAGEN PO Take 1 tablet by mouth daily. Super Collagen w/Biotin    . ibuprofen (ADVIL) 200 MG tablet Take 600 mg by mouth every 8 (eight) hours as needed (for pain.).     Marland Kitchen lidocaine-prilocaine (EMLA) cream Apply 1 application topically as needed. 30 g 0  . methimazole (TAPAZOLE) 5 MG tablet Take 5 mg by mouth daily.     . Multiple Vitamin (MULTIVITAMIN WITH MINERALS) TABS tablet Take 1 tablet by mouth daily.    . Omega 3-6-9 Fatty Acids (OMEGA 3-6-9 COMPLEX) CAPS Take 1 capsule by mouth daily.    . ondansetron (ZOFRAN) 8 MG tablet One pill every 8 hours as needed for nausea/vomitting. 40 tablet 1  . prochlorperazine (COMPAZINE) 10 MG  tablet Take 1 tablet (10 mg total) by mouth every 6 (six) hours as needed for nausea or vomiting. 40 tablet 1  . valACYclovir (VALTREX) 500 MG tablet TAKE ONE TABLET BY MOUTH DAILY 30 tablet 11   No current facility-administered medications for this visit.    PHYSICAL EXAMINATION: ECOG PERFORMANCE STATUS: 0 - Asymptomatic  Vitals:   08/13/20 0927  BP: 138/66  Pulse: 66  Temp: (!) 96.8 F (36 C)  SpO2: 100%   Filed Weights   08/13/20 0927  Weight: 180 lb 6.4 oz (81.8 kg)    Physical Exam HENT:     Head: Normocephalic and atraumatic.     Mouth/Throat:     Pharynx: No oropharyngeal exudate.  Eyes:     Pupils: Pupils are equal, round, and reactive to light.  Cardiovascular:     Rate and Rhythm: Normal rate and regular rhythm.  Pulmonary:     Effort: Pulmonary effort is normal. No respiratory distress.     Breath sounds: Normal breath sounds. No wheezing.  Abdominal:     General: Bowel  sounds are normal. There is no distension.     Palpations: Abdomen is soft. There is no mass.     Tenderness: There is no abdominal tenderness. There is no guarding or rebound.  Musculoskeletal:        General: No tenderness. Normal range of motion.     Cervical back: Normal range of motion and neck supple.  Skin:    General: Skin is warm.  Neurological:     Mental Status: She is alert and oriented to person, place, and time.  Psychiatric:        Mood and Affect: Affect normal.    LABORATORY DATA:  I have reviewed the data as listed Lab Results  Component Value Date   WBC 4.2 08/13/2020   HGB 12.2 08/13/2020   HCT 38.1 08/13/2020   MCV 88.8 08/13/2020   PLT 194 08/13/2020   Recent Labs    11/20/19 0903 12/18/19 0840 01/01/20 0859 01/22/20 0905 06/25/20 0849 07/16/20 0858 08/13/20 0841  NA 139 143 139   < > 141 140 141  K 4.0 4.1 4.1   < > 4.1 4.0 4.1  CL 107 109 104   < > 108 106 104  CO2 '24 24 24   ' < > '25 24 25  ' GLUCOSE 98 105* 106*   < > 109* 116* 157*  BUN 22* 14 16   < > 17 27* 20  CREATININE 0.83 0.72 0.81   < > 0.73 0.79 0.96  CALCIUM 9.6 9.0 9.5   < > 9.4 9.0 9.4  GFRNONAA >60 >60 >60   < > >60 >60 >60  GFRAA >60 >60 >60  --   --   --   --   PROT 6.7 6.6 7.1   < > 6.9 6.7 6.9  ALBUMIN 4.1 4.0 4.2   < > 4.2 4.2 4.2  AST 32 36 39   < > 46* 52* 56*  ALT 36 35 39   < > 45* 59* 68*  ALKPHOS 72 76 76   < > 68 62 57  BILITOT 0.8 0.5 0.8   < > 0.8 0.9 1.0   < > = values in this interval not displayed.    RADIOGRAPHIC STUDIES: I have personally reviewed the radiological images as listed and agreed with the findings in the report. MM DIAG BREAST TOMO BILATERAL  Result Date: 07/20/2020 CLINICAL DATA:  History of  right breast cancer status post lumpectomy in April of 2021. EXAM: DIGITAL DIAGNOSTIC BILATERAL MAMMOGRAM WITH TOMOSYNTHESIS AND CAD TECHNIQUE: Bilateral digital diagnostic mammography and breast tomosynthesis was performed. The images were evaluated with  computer-aided detection. COMPARISON:  Previous exam(s). ACR Breast Density Category b: There are scattered areas of fibroglandular density. FINDINGS: Lumpectomy changes are seen in the right breast. In the upper-outer quadrant of the breast there are grouped dystrophic appearing calcifications spanning an area of 3 mm. There are several anterior located punctate calcifications. The calcium deposits are felt to be benign. There is no suspicious mass. No suspicious mass or malignant type microcalcifications identified in the left breast. IMPRESSION: Probable benign calcifications in the right breast. No suspicious abnormality seen in the left breast. RECOMMENDATION: Short-term interval follow-up right mammogram in 6 months is recommended. I have discussed the findings and recommendations with the patient. If applicable, a reminder letter will be sent to the patient regarding the next appointment. BI-RADS CATEGORY  3: Probably benign. Electronically Signed   By: Lillia Mountain M.D.   On: 07/20/2020 10:34   ASSESSMENT & PLAN:   Carcinoma of upper-outer quadrant of right breast in female, estrogen receptor positive ([place]) #Right breast-invasive mammary carcinoma- STAGE I- TRIPLE POSITIVE;currently on Herceptin maintenance. On Anastrazole. STABLE.  New prescription sent.  Mammogram April 2022-likely postoperative changes; diagnostic mammogram in 6 months.  # Proceed with maintenance herceptin today; Labs today reviewed;  acceptable for treatment today. 10/04- MUGA scan- 64; FEB 2022- 67%.  Pt has one more infusion in  May 2022.   # Right chest wall rash/Shingles-resolved; continue on Valtrex 500 mg once a day [sec prophy]; STABLE  # SWOG study/PN study- PN- G-0; research data filled out.   # Fatty liver- on [place]- AST/ALT [56/68] -grade 1 slightly elevated; STABLE; recommend weight loss/diet.   # OSTEOPENIA -BMD [ dec 2021-T-score of -1.2].- on adjuvant zometa q 6 M [jan 6th,0222]; STABLE.   # mild ankle  swelling- likley dependant; clinically not suggestive of CHF.  Check BNP  # port- discussed re: port ex-plantation; will keep for now for zometa  # Disposition: ADD BNP # Herceptin today #  follow up in 3 weeks X-MD;labs- cbc/cmp; Herceptin;  # follow up in  2nd week of June 2022- MD; labs- cmp; zometa- Dr.B   All questions were answered. The patient/family knows to call the clinic with any problems, questions or concerns.    Cammie Sickle, MD 08/13/2020 1:18 PM

## 2020-08-13 NOTE — Research (Signed)
S1714 - A Prospective Observational Cohort Study to Develop a Predictive Model of Taxane-Induced Peripheral Neuropathy in Cancer Patients  08/13/20  Week 52 Research Visit: Patient presented to the clinic for her 52-week research visit.    PROs: Patient completed required PROs in the clinic for the 52 week visit per protocol.  Neuropathy Assessment: Neuropathy assessment was performed by Jeral Fruit, RN, per protocol.  This Research officer, political party assisted with timing and recording.  The patients medication list was reviewed for accuracy.  Patient reports no falls in the past 6 months.  Timed get up and go assessment was performed by this research coordinator per protocol.  The patient reports some tingling sensation in her feet occasionally but not all the time.  She also reports some swelling in both lower extremities.  The solicited neuropathy events form and follow up physician assessment was completed by Dr. Rogue Bussing.  The patient was thanked for her time and participation in this research study.    Clabe Seal Clinical Research Coordinator I  08/13/20 11:43 AM

## 2020-08-13 NOTE — Patient Instructions (Signed)
[facility]     Discharge Instructions: Thank you for choosing [facility] to provide your oncology and hematology care.  If you have a lab appointment with the [facility], please go directly to the [facility] and check in at the registration area.  Wear comfortable clothing and clothing appropriate for easy access to any Portacath or PICC line.   We strive to give you quality time with your provider. You may need to reschedule your appointment if you arrive late (15 or more minutes).  Arriving late affects you and other patients whose appointments are after yours.  Also, if you miss three or more appointments without notifying the office, you may be dismissed from the clinic at the provider's discretion.      For prescription refill requests, have your pharmacy contact our office and allow 72 hours for refills to be completed.    Today you received the following chemotherapy and/or immunotherapy agents: Trastuzumab-anns (Kanjinti).      To help prevent nausea and vomiting after your treatment, we encourage you to take your nausea medication as directed.  BELOW ARE SYMPTOMS THAT SHOULD BE REPORTED IMMEDIATELY: . *FEVER GREATER THAN 100.4 F (38 C) OR HIGHER . *CHILLS OR SWEATING . *NAUSEA AND VOMITING THAT IS NOT CONTROLLED WITH YOUR NAUSEA MEDICATION . *UNUSUAL SHORTNESS OF BREATH . *UNUSUAL BRUISING OR BLEEDING . *URINARY PROBLEMS (pain or burning when urinating, or frequent urination) . *BOWEL PROBLEMS (unusual diarrhea, constipation, pain near the anus) . TENDERNESS IN MOUTH AND THROAT WITH OR WITHOUT PRESENCE OF ULCERS (sore throat, sores in mouth, or a toothache) . UNUSUAL RASH, SWELLING OR PAIN  . UNUSUAL VAGINAL DISCHARGE OR ITCHING   Items with * indicate a potential emergency and should be followed up as soon as possible or go to the Emergency Department if any problems should occur.  Please show the CHEMOTHERAPY ALERT  CARD or IMMUNOTHERAPY ALERT CARD at check-in to the Emergency Department and triage nurse.  Should you have questions after your visit or need to cancel or reschedule your appointment, please contact [facility]  [phone] and follow the prompts.  Office hours are 8:00 a.m. to 4:30 p.m. Monday - Friday. Please note that voicemails left after 4:00 p.m. may not be returned until the following business day.  We are closed weekends and major holidays. You have access to a nurse at all times for urgent questions. Please call the main number to the clinic [phone] and follow the prompts.  For any non-urgent questions, you may also contact your provider using MyChart. We now offer e-Visits for anyone 49 and older to request care online for non-urgent symptoms. For details visit [email or web address].   Also download the MyChart app! Go to the app store, search "MyChart", open the app, select [facility], and log in with your MyChart username and password.  Due to Covid, a mask is required upon entering the hospital/clinic. If you do not have a mask, one will be given to you upon arrival. For doctor visits, patients may have 1 support person aged 30 or older with them. For treatment visits, patients cannot have anyone with them due to current Covid guidelines and our immunocompromised population.   Trastuzumab injection for infusion What is this medicine? TRASTUZUMAB (tras TOO zoo mab) is a monoclonal antibody. It is used to treat breast cancer and stomach cancer. This medicine may be used for other purposes; ask your health care provider or  pharmacist if you have questions. COMMON BRAND NAME(S): Herceptin, Galvin Proffer, Trazimera What should I tell my health care provider before I take this medicine? They need to know if you have any of these conditions:  heart disease  heart failure  lung or breathing disease, like asthma  an  unusual or allergic reaction to trastuzumab, benzyl alcohol, or other medications, foods, dyes, or preservatives  pregnant or trying to get pregnant  breast-feeding How should I use this medicine? This drug is given as an infusion into a vein. It is administered in a hospital or clinic by a specially trained health care professional. Talk to your pediatrician regarding the use of this medicine in children. This medicine is not approved for use in children. Overdosage: If you think you have taken too much of this medicine contact a poison control center or emergency room at once. NOTE: This medicine is only for you. Do not share this medicine with others. What if I miss a dose? It is important not to miss a dose. Call your doctor or health care professional if you are unable to keep an appointment. What may interact with this medicine? This medicine may interact with the following medications:  certain types of chemotherapy, such as daunorubicin, doxorubicin, epirubicin, and idarubicin This list may not describe all possible interactions. Give your health care provider a list of all the medicines, herbs, non-prescription drugs, or dietary supplements you use. Also tell them if you smoke, drink alcohol, or use illegal drugs. Some items may interact with your medicine. What should I watch for while using this medicine? Visit your doctor for checks on your progress. Report any side effects. Continue your course of treatment even though you feel ill unless your doctor tells you to stop. Call your doctor or health care professional for advice if you get a fever, chills or sore throat, or other symptoms of a cold or flu. Do not treat yourself. Try to avoid being around people who are sick. You may experience fever, chills and shaking during your first infusion. These effects are usually mild and can be treated with other medicines. Report any side effects during the infusion to your health care  professional. Fever and chills usually do not happen with later infusions. Do not become pregnant while taking this medicine or for 7 months after stopping it. Women should inform their doctor if they wish to become pregnant or think they might be pregnant. Women of child-bearing potential will need to have a negative pregnancy test before starting this medicine. There is a potential for serious side effects to an unborn child. Talk to your health care professional or pharmacist for more information. Do not breast-feed an infant while taking this medicine or for 7 months after stopping it. Women must use effective birth control with this medicine. What side effects may I notice from receiving this medicine? Side effects that you should report to your doctor or health care professional as soon as possible:  allergic reactions like skin rash, itching or hives, swelling of the face, lips, or tongue  chest pain or palpitations  cough  dizziness  feeling faint or lightheaded, falls  fever  general ill feeling or flu-like symptoms  signs of worsening heart failure like breathing problems; swelling in your legs and feet  unusually weak or tired Side effects that usually do not require medical attention (report to your doctor or health care professional if they continue or are bothersome):  bone pain  changes in taste  diarrhea  joint pain  nausea/vomiting  weight loss This list may not describe all possible side effects. Call your doctor for medical advice about side effects. You may report side effects to FDA at 1-800-FDA-1088. Where should I keep my medicine? This drug is given in a hospital or clinic and will not be stored at home. NOTE: This sheet is a summary. It may not cover all possible information. If you have questions about this medicine, talk to your doctor, pharmacist, or health care provider.  2021 Elsevier/Gold Standard (2016-03-29 14:37:52)

## 2020-08-27 ENCOUNTER — Other Ambulatory Visit: Payer: Managed Care, Other (non HMO)

## 2020-08-27 ENCOUNTER — Ambulatory Visit: Payer: Managed Care, Other (non HMO)

## 2020-08-31 ENCOUNTER — Other Ambulatory Visit: Payer: Self-pay | Admitting: Obstetrics & Gynecology

## 2020-08-31 DIAGNOSIS — Z1231 Encounter for screening mammogram for malignant neoplasm of breast: Secondary | ICD-10-CM

## 2020-09-03 ENCOUNTER — Other Ambulatory Visit: Payer: Self-pay | Admitting: Oncology

## 2020-09-03 ENCOUNTER — Inpatient Hospital Stay: Payer: Managed Care, Other (non HMO) | Attending: Nurse Practitioner

## 2020-09-03 ENCOUNTER — Inpatient Hospital Stay (HOSPITAL_BASED_OUTPATIENT_CLINIC_OR_DEPARTMENT_OTHER): Payer: Managed Care, Other (non HMO) | Admitting: Nurse Practitioner

## 2020-09-03 ENCOUNTER — Encounter: Payer: Self-pay | Admitting: Nurse Practitioner

## 2020-09-03 ENCOUNTER — Inpatient Hospital Stay: Payer: Managed Care, Other (non HMO)

## 2020-09-03 ENCOUNTER — Other Ambulatory Visit: Payer: Self-pay

## 2020-09-03 VITALS — BP 128/70 | HR 68 | Temp 97.1°F | Resp 16 | Ht 66.0 in | Wt 180.4 lb

## 2020-09-03 DIAGNOSIS — C50411 Malignant neoplasm of upper-outer quadrant of right female breast: Secondary | ICD-10-CM

## 2020-09-03 DIAGNOSIS — Z5112 Encounter for antineoplastic immunotherapy: Secondary | ICD-10-CM | POA: Insufficient documentation

## 2020-09-03 DIAGNOSIS — Z170 Estrogen receptor positive status [ER+]: Secondary | ICD-10-CM

## 2020-09-03 DIAGNOSIS — Z5111 Encounter for antineoplastic chemotherapy: Secondary | ICD-10-CM

## 2020-09-03 LAB — CBC WITH DIFFERENTIAL/PLATELET
Abs Immature Granulocytes: 0.04 10*3/uL (ref 0.00–0.07)
Basophils Absolute: 0.1 10*3/uL (ref 0.0–0.1)
Basophils Relative: 2 %
Eosinophils Absolute: 0.1 10*3/uL (ref 0.0–0.5)
Eosinophils Relative: 3 %
HCT: 36.7 % (ref 36.0–46.0)
Hemoglobin: 11.8 g/dL — ABNORMAL LOW (ref 12.0–15.0)
Immature Granulocytes: 1 %
Lymphocytes Relative: 21 %
Lymphs Abs: 1 10*3/uL (ref 0.7–4.0)
MCH: 28.6 pg (ref 26.0–34.0)
MCHC: 32.2 g/dL (ref 30.0–36.0)
MCV: 89.1 fL (ref 80.0–100.0)
Monocytes Absolute: 0.5 10*3/uL (ref 0.1–1.0)
Monocytes Relative: 10 %
Neutro Abs: 3 10*3/uL (ref 1.7–7.7)
Neutrophils Relative %: 63 %
Platelets: 205 10*3/uL (ref 150–400)
RBC: 4.12 MIL/uL (ref 3.87–5.11)
RDW: 13.4 % (ref 11.5–15.5)
WBC: 4.7 10*3/uL (ref 4.0–10.5)
nRBC: 0 % (ref 0.0–0.2)

## 2020-09-03 LAB — COMPREHENSIVE METABOLIC PANEL
ALT: 36 U/L (ref 0–44)
AST: 33 U/L (ref 15–41)
Albumin: 4 g/dL (ref 3.5–5.0)
Alkaline Phosphatase: 60 U/L (ref 38–126)
Anion gap: 10 (ref 5–15)
BUN: 19 mg/dL (ref 6–20)
CO2: 24 mmol/L (ref 22–32)
Calcium: 9.1 mg/dL (ref 8.9–10.3)
Chloride: 106 mmol/L (ref 98–111)
Creatinine, Ser: 0.75 mg/dL (ref 0.44–1.00)
GFR, Estimated: >60 mL/min (ref >60)
Glucose, Bld: 117 mg/dL — ABNORMAL HIGH (ref 70–99)
Potassium: 3.9 mmol/L (ref 3.5–5.1)
Sodium: 140 mmol/L (ref 135–145)
Total Bilirubin: 1 mg/dL (ref 0.3–1.2)
Total Protein: 6.9 g/dL (ref 6.5–8.1)

## 2020-09-03 MED ORDER — HEPARIN SOD (PORK) LOCK FLUSH 100 UNIT/ML IV SOLN
500.0000 [IU] | Freq: Once | INTRAVENOUS | Status: AC | PRN
  Administered 2020-09-03: 500 [IU]
  Filled 2020-09-03: qty 5

## 2020-09-03 MED ORDER — SODIUM CHLORIDE 0.9 % IV SOLN
450.0000 mg | Freq: Once | INTRAVENOUS | Status: AC
  Administered 2020-09-03: 450 mg via INTRAVENOUS
  Filled 2020-09-03: qty 21.43

## 2020-09-03 MED ORDER — HEPARIN SOD (PORK) LOCK FLUSH 100 UNIT/ML IV SOLN
INTRAVENOUS | Status: AC
  Filled 2020-09-03: qty 5

## 2020-09-03 MED ORDER — ACETAMINOPHEN 325 MG PO TABS
650.0000 mg | ORAL_TABLET | Freq: Once | ORAL | Status: AC
Start: 2020-09-03 — End: 2020-09-03
  Administered 2020-09-03: 650 mg via ORAL
  Filled 2020-09-03: qty 2

## 2020-09-03 MED ORDER — SODIUM CHLORIDE 0.9 % IV SOLN
Freq: Once | INTRAVENOUS | Status: AC
  Filled 2020-09-03: qty 250

## 2020-09-03 MED ORDER — DIPHENHYDRAMINE HCL 25 MG PO CAPS
25.0000 mg | ORAL_CAPSULE | Freq: Once | ORAL | Status: AC
  Administered 2020-09-03: 25 mg via ORAL
  Filled 2020-09-03: qty 1

## 2020-09-03 NOTE — Progress Notes (Signed)
[facility] PROGRESS NOTE  Patient Care Team: Birdie Sons, MD as PCP - General (Family Medicine) [facility] (Gynecology) Theodore Demark, RN as Oncology Nurse Navigator Cammie Sickle, MD as Consulting Physician (Hematology and Oncology)  CHIEF COMPLAINTS/PURPOSE OF CONSULTATION: Breast cancer  #  Oncology History Overview Note  # MARCH 2021- RUOQ- 75m- [BREAST, RIGHT, LATERAL POSTERIOR  - INVASIVE MAMMARY CARCINOMA, NO SPECIAL TYPE, ASSOCIATED WITH  CALCIFICATIONS.][facility] ER-> 90%; PR- 50-90%; HER 2 FISH- POSITIVE  # S/P LUMPECTOMY [Dr.Cintron] pT1a [46m;pN0 [STAGE IA- ER/PRpoS; Her 2 POS]  . BREAST, RIGHT, LATERAL MIDDLE DEPTH (RIBBON-SHAPED CLIP):  STEREOTACTIC-GUIDED CORE BIOPSY: - DUCTAL CARCINOMA IN SITU (DCIS), HIGH-GRADE, ASSOCIATED WITH  CALCIFICATIONS.   # May 14th,2021- TH-H; s/p RT [finished OCT 2021]; NOV 23rd 2021- start Anastrazole  #Hypothyroidism-methimazole [Dr.Solum]  # SURVIVORSHIP:   # GENETICS:   DIAGNOSIS: Breast cancer  STAGE:   1      ;  GOALS: Cure  CURRENT/MOST RECENT THERAPY : Taxol Herceptin   Carcinoma of upper-outer quadrant of right breast in female, estrogen receptor positive ([facility] 07/15/2019 Initial Diagnosis   Carcinoma of upper-outer quadrant of right breast in female, estrogen receptor positive ([facility]  08/28/2019 -  Chemotherapy    Patient is on Treatment Plan: BREAST WEEKLY PACLITAXEL / TRASTUZUMAB / MAINTENANCE TRASTUZUMAB EVERY 21 DAYS        HISTORY OF PRESENTING ILLNESS:  [facility] [age].o.  female with breast cancer, early stage ER/PR positive, HER-2/neu positive on adjuvant anastrozole/herceptin maintenance, who returns to clinic for follow up.  Continues to have mild swelling of the feet and ankles which is mild in the morning and worsens during the day but not bothersome.  No new cough or shortness of breath.  No chest pains.  No interval fevers or infections.  No numbness or tingling.  No  joint aches or pains.  She denies other specific complaints.   Review of Systems  Constitutional: Negative for chills, diaphoresis, fever, malaise/fatigue and weight loss.  HENT: Negative for nosebleeds and sore throat.   Eyes: Negative for double vision.  Respiratory: Negative for cough, hemoptysis, sputum production, shortness of breath and wheezing.   Cardiovascular: Positive for leg swelling. Negative for chest pain, palpitations and orthopnea.  Gastrointestinal: Negative for abdominal pain, blood in stool, constipation, diarrhea, heartburn, melena, nausea and vomiting.  Genitourinary: Negative for dysuria, frequency and urgency.  Musculoskeletal: Negative for back pain and joint pain.  Skin: Negative for itching.  Neurological: Negative for dizziness, focal weakness, weakness and headaches.  Endo/Heme/Allergies: Does not bruise/bleed easily.  Psychiatric/Behavioral: Negative for depression. The patient is not nervous/anxious and does not have insomnia.      MEDICAL HISTORY:  Past Medical History:  Diagnosis Date  . Atypical mole 09/26/2018   right mid anterior side/mild  . Basal cell carcinoma 08/25/2014   right med cheek infraorbital  . Breast cancer ([facility]   [facility] DCIS  . Dyslipidemia   . Family history of bladder cancer   . Graves disease   . Hyperthyroidism   . Inborn lipid storage disorder   . Leiomyoma of uterus   . Personal history of chemotherapy 2021   RIGHT lumpectomy  . Personal history of radiation therapy 2021   RIGHT lumpectomy  . Vitamin D deficiency     SURGICAL HISTORY: Past Surgical History:  Procedure Laterality Date  . APPENDECTOMY  1982   Cyst removed from ovaries   . BREAST BIOPSY  Right 07/05/2019   stereo bx, coil clip, IMC  . BREAST BIOPSY Right 07/05/2019   stereo bx, ribbon clip, DCIS   . BREAST LUMPECTOMY Right 07/31/2019    [facility] and DCIS with SN bx  . COLONOSCOPY WITH PROPOFOL N/A 01/20/2017   Procedure: COLONOSCOPY WITH  PROPOFOL;  Surgeon: Jonathon Bellows, MD;  Location: [facility] ENDOSCOPY;  Service: Gastroenterology;  Laterality: N/A;  . [place]  . NECK SURGERY  2013   LIft from weight loss  . PARTIAL MASTECTOMY WITH NEEDLE LOCALIZATION AND AXILLARY SENTINEL LYMPH NODE BX Right 07/31/2019   Procedure: RIGHT PARTIAL MASTECTOMY WITH BRACKETED NEEDLE LOCALIZATION AND LEFT AXILLARY SENTINEL LYMPH NODE BIOPSY;  Surgeon: Herbert Pun, MD;  Location: [facility] ORS;  Service: General;  Laterality: Right;  . PORTACATH PLACEMENT Left 07/31/2019   Procedure: INSERTION PORT-A-CATH LEFT INTERNAL JUGULAR;  Surgeon: Herbert Pun, MD;  Location: [facility] ORS;  Service: General;  Laterality: Left;  . TONSILLECTOMY  1969    SOCIAL HISTORY: Social History   Socioeconomic History  . Marital status: Married    Spouse name: Not on file  . Number of children: 0  . Years of education: Anselm Lis  . Highest education level: Not on file  Occupational History  . Occupation: Full-Time    Comment: [employer] x9  Tobacco Use  . Smoking status: Former Research scientist (life sciences)  . Smokeless tobacco: Never Used  Vaping Use  . Vaping Use: Never used  Substance and Sexual Activity  . Alcohol use: Yes    Alcohol/week: 0.0 standard drinks    Comment: RARELY  . Drug use: No  . Sexual activity: Yes    Birth control/protection: Post-menopausal  Other Topics Concern  . Not on file  Social History Narrative   Lives close to [place]; with husband. Quit smoking in 2007 [25 years]; ocassional alcohol. 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    Social Determinants of Health   Financial Resource Strain: Not on file  Food Insecurity: Not on file  Transportation Needs: Not on file  Physical Activity: Not on file  Stress: Not on file  Social Connections: Not on file  Intimate Partner Violence: Not on file    FAMILY HISTORY: Family History  Problem Relation Age of Onset  . Bladder Cancer Mother   . Diabetes Father   .  Heart disease Father   . Bladder Cancer Brother   . Breast cancer Neg Hx     ALLERGIES:  is allergic to penicillins.  MEDICATIONS:  Current Outpatient Medications  Medication Sig Dispense Refill  . anastrozole (ARIMIDEX) 1 MG tablet Take 1 tablet (1 mg total) by mouth daily. 90 tablet 1  . B COMPLEX VITAMINS PO Take 1 tablet by mouth daily.     . chlorhexidine (PERIDEX) 0.12 % solution USE 15MLS IN THE MOUTH OR THROAT TWICE DAILY AS DIRECTED 473 mL 0  . Cholecalciferol (VITAMIN D) 125 MCG (5000 UT) CAPS Take 5,000 Units by mouth daily.     . COLLAGEN PO Take 1 tablet by mouth daily. Super Collagen w/Biotin    . ibuprofen (ADVIL) 200 MG tablet Take 600 mg by mouth every 8 (eight) hours as needed (for pain.).     Marland Kitchen lidocaine-prilocaine (EMLA) cream Apply 1 application topically as needed. 30 g 0  . methimazole (TAPAZOLE) 5 MG tablet Take 5 mg by mouth daily.     . Multiple Vitamin (MULTIVITAMIN WITH MINERALS) TABS tablet Take 1 tablet by mouth daily.    Ernestine Conrad  3-6-9 Fatty Acids (OMEGA 3-6-9 COMPLEX) CAPS Take 1 capsule by mouth daily.    . ondansetron (ZOFRAN) 8 MG tablet One pill every 8 hours as needed for nausea/vomitting. 40 tablet 1  . prochlorperazine (COMPAZINE) 10 MG tablet Take 1 tablet (10 mg total) by mouth every 6 (six) hours as needed for nausea or vomiting. 40 tablet 1  . valACYclovir (VALTREX) 500 MG tablet TAKE ONE TABLET BY MOUTH DAILY 30 tablet 11   No current facility-administered medications for this visit.    PHYSICAL EXAMINATION: ECOG PERFORMANCE STATUS: 0 - Asymptomatic  Vitals:   09/03/20 0904  BP: 128/70  Pulse: 68  Resp: 16  Temp: (!) 97.1 F (36.2 C)  SpO2: 100%   Filed Weights   09/03/20 0904  Weight: 180 lb 6.4 oz (81.8 kg)    Physical Exam HENT:     Head: Normocephalic and atraumatic.     Mouth/Throat:     Pharynx: No oropharyngeal exudate.  Eyes:     Pupils: Pupils are equal, round, and reactive to light.  Cardiovascular:     Rate and  Rhythm: Normal rate and regular rhythm.  Pulmonary:     Effort: Pulmonary effort is normal. No respiratory distress.     Breath sounds: Normal breath sounds. No wheezing.  Abdominal:     General: Bowel sounds are normal. There is no distension.     Palpations: Abdomen is soft. There is no mass.     Tenderness: There is no abdominal tenderness. There is no guarding or rebound.  Musculoskeletal:        General: No tenderness. Normal range of motion.     Cervical back: Normal range of motion and neck supple.  Skin:    General: Skin is warm.  Neurological:     Mental Status: She is alert and oriented to person, place, and time.  Psychiatric:        Mood and Affect: Affect normal.    LABORATORY DATA:  I have reviewed the data as listed Lab Results  Component Value Date   WBC 4.2 08/13/2020   HGB 12.2 08/13/2020   HCT 38.1 08/13/2020   MCV 88.8 08/13/2020   PLT 194 08/13/2020   Recent Labs    11/20/19 0903 12/18/19 0840 01/01/20 0859 01/22/20 0905 06/25/20 0849 07/16/20 0858 08/13/20 0841  NA 139 143 139   < > 141 140 141  K 4.0 4.1 4.1   < > 4.1 4.0 4.1  CL 107 109 104   < > 108 106 104  CO2 _0 < > _1 GLUCOSE 98 105* 106*   < > 109* 116* 157*  BUN 22* 14 16   < > 17 27* 20  CREATININE 0.83 0.72 0.81   < > 0.73 0.79 0.96  CALCIUM 9.6 9.0 9.5   < > 9.4 9.0 9.4  GFRNONAA >60 >60 >60   < > >60 >60 >60  GFRAA >60 >60 >60  --   --   --   --   PROT 6.7 6.6 7.1   < > 6.9 6.7 6.9  ALBUMIN 4.1 4.0 4.2   < > 4.2 4.2 4.2  AST 32 36 39   < > 46* 52* 56*  ALT 36 35 39   < > 45* 59* 68*  ALKPHOS 72 76 76   < > 68 62 57  BILITOT 0.8 0.5 0.8   < > 0.8 0.9 1.0   < > =  values in this interval not displayed.    RADIOGRAPHIC STUDIES: I have personally reviewed the radiological images as listed and agreed with the findings in the report. No results found. ASSESSMENT & PLAN:  1.  Right breast invasive mammary carcinoma-stage I-triple positive.  Currently on Herceptin  maintenance.  Mammogram April 2022 likely postoperative changes.  Diagnostic mammogram recommended in 6 months (around October 2022).  Labs today reviewed and acceptable for continuation of treatment.  10/4 MUGA scan-64.  February 20 22-67%.  After today's infusion patient will have completed 1 year of maintenance Herceptin.  She will continue anastrozole for at least 5 years of adjuvant endocrine therapy based on ER/PR positivity.  Continue anastrozole.  2.  Osteopenia-BMD December 2021 T score -1.2.  On adjuvant Zometa every 6 months.  Last 04/23/2020.  She received Zometa at next visit.  3.  Right chest wall rash/shingles-resolved.  Continue Valtrex 500 mg once a day for prophylaxis.  4.SWOG study/PN study- PN G-0.   5.  Fatty liver-based on ultrasound.  AST 56 ALT 68. Grade 1. Recommend diet and exercise. Stable.   6. Ankle swelling- mild. BNP normal 11.3. Clinically not suggestive of CHF. Likely dependent edema. Monitor.   7. Port- Patient wishes to keep for zometa administration at this point. Continue port flushes every 6-12 weeks.    Disposition:  Proceed with herceptin today Follow up with Dr. Rogue Bussing as scheduled.   No problem-specific Assessment & Plan notes found for this encounter.  All questions were answered. The patient/family knows to call the clinic with any problems, questions or concerns.    Verlon Au, NP 09/03/2020 3:55 PM

## 2020-09-03 NOTE — Progress Notes (Signed)
Pt received herceptin infusion in clinic today. Tolerated well.

## 2020-09-03 NOTE — Patient Instructions (Signed)
[facility]    Discharge Instructions: Thank you for choosing [facility] to provide your oncology and hematology care.  If you have a lab appointment with the [facility], please go directly to the [facility] and check in at the registration area.  Wear comfortable clothing and clothing appropriate for easy access to any Portacath or PICC line.   We strive to give you quality time with your provider. You may need to reschedule your appointment if you arrive late (15 or more minutes).  Arriving late affects you and other patients whose appointments are after yours.  Also, if you miss three or more appointments without notifying the office, you may be dismissed from the clinic at the provider's discretion.      For prescription refill requests, have your pharmacy contact our office and allow 72 hours for refills to be completed.    Today you received the following chemotherapy and/or immunotherapy agents - herceptin   To help prevent nausea and vomiting after your treatment, we encourage you to take your nausea medication as directed.  BELOW ARE SYMPTOMS THAT SHOULD BE REPORTED IMMEDIATELY: . *FEVER GREATER THAN 100.4 F (38 C) OR HIGHER . *CHILLS OR SWEATING . *NAUSEA AND VOMITING THAT IS NOT CONTROLLED WITH YOUR NAUSEA MEDICATION . *UNUSUAL SHORTNESS OF BREATH . *UNUSUAL BRUISING OR BLEEDING . *URINARY PROBLEMS (pain or burning when urinating, or frequent urination) . *BOWEL PROBLEMS (unusual diarrhea, constipation, pain near the anus) . TENDERNESS IN MOUTH AND THROAT WITH OR WITHOUT PRESENCE OF ULCERS (sore throat, sores in mouth, or a toothache) . UNUSUAL RASH, SWELLING OR PAIN  . UNUSUAL VAGINAL DISCHARGE OR ITCHING   Items with * indicate a potential emergency and should be followed up as soon as possible or go to the Emergency Department if any problems should occur.  Please show the CHEMOTHERAPY ALERT CARD or IMMUNOTHERAPY  ALERT CARD at check-in to the Emergency Department and triage nurse.  Should you have questions after your visit or need to cancel or reschedule your appointment, please contact [facility]  [phone] and follow the prompts.  Office hours are 8:00 a.m. to 4:30 p.m. Monday - Friday. Please note that voicemails left after 4:00 p.m. may not be returned until the following business day.  We are closed weekends and major holidays. You have access to a nurse at all times for urgent questions. Please call the main number to the clinic [phone] and follow the prompts.  For any non-urgent questions, you may also contact your provider using MyChart. We now offer e-Visits for anyone 47 and older to request care online for non-urgent symptoms. For details visit [email or web address].   Also download the MyChart app! Go to the app store, search "MyChart", open the app, select [facility], and log in with your MyChart username and password.  Due to Covid, a mask is required upon entering the hospital/clinic. If you do not have a mask, one will be given to you upon arrival. For doctor visits, patients may have 1 support person aged 28 or older with them. For treatment visits, patients cannot have anyone with them due to current Covid guidelines and our immunocompromised population.   Acetaminophen tablets or caplets What is this medicine? ACETAMINOPHEN (a set a MEE noe fen) is a pain reliever. It is used to treat mild pain and fever. This medicine may be used for other purposes; ask your health care provider or pharmacist if you  have questions. COMMON BRAND NAME(S): Aceta, Actamin, Anacin Aspirin Free, Genapap, Genebs, Mapap, Pain & Fever, Pain and Fever, PAIN RELIEF, PAIN RELIEF Extra Strength, Pain Reliever, Panadol, PHARBETOL, Plus PHARMA, Q-Pap, Q-Pap Extra Strength, Tylenol, Tylenol CrushableTablet, Tylenol Extra Strength, Tylenol Regular Strength, XS No Aspirin, XS  Pain Reliever What should I tell my health care provider before I take this medicine? They need to know if you have any of these conditions:  if you often drink alcohol  liver disease  an unusual or allergic reaction to acetaminophen, other medicines, foods, dyes, or preservatives  pregnant or trying to get pregnant  breast-feeding How should I use this medicine? Take this medicine by mouth with a glass of water. Follow the directions on the package or prescription label. Take your medicine at regular intervals. Do not take your medicine more often than directed. Talk to your pediatrician regarding the use of this medicine in children. While this drug may be prescribed for children as young as 23 years of age for selected conditions, precautions do apply. Overdosage: If you think you have taken too much of this medicine contact a poison control center or emergency room at once. NOTE: This medicine is only for you. Do not share this medicine with others. What if I miss a dose? If you miss a dose, take it as soon as you can. If it is almost time for your next dose, take only that dose. Do not take double or extra doses. What may interact with this medicine?  alcohol  imatinib  isoniazid  other medicines with acetaminophen This list may not describe all possible interactions. Give your health care provider a list of all the medicines, herbs, non-prescription drugs, or dietary supplements you use. Also tell them if you smoke, drink alcohol, or use illegal drugs. Some items may interact with your medicine. What should I watch for while using this medicine? Tell your doctor or health care professional if the pain lasts more than 10 days (5 days for children), if it gets worse, or if there is a new or different kind of pain. Also, check with your doctor if a fever lasts for more than 3 days. Do not take other medicines that contain acetaminophen with this medicine. Always read labels  carefully. If you have questions, ask your doctor or pharmacist. If you take too much acetaminophen get medical help right away. Too much acetaminophen can be very dangerous and cause liver damage. Even if you do not have symptoms, it is important to get help right away. What side effects may I notice from receiving this medicine? Side effects that you should report to your doctor or health care professional as soon as possible:  allergic reactions like skin rash, itching or hives, swelling of the face, lips, or tongue  breathing problems  fever or sore throat  redness, blistering, peeling or loosening of the skin, including inside the mouth  trouble passing urine or change in the amount of urine  unusual bleeding or bruising  unusually weak or tired  yellowing of the eyes or skin Side effects that usually do not require medical attention (report to your doctor or health care professional if they continue or are bothersome):  headache  nausea, stomach upset This list may not describe all possible side effects. Call your doctor for medical advice about side effects. You may report side effects to FDA at 1-800-FDA-1088. Where should I keep my medicine? Keep out of reach of children. Store at room temperature  between 20 and 25 degrees C (68 and 77 degrees F). Protect from moisture and heat. Throw away any unused medicine after the expiration date. NOTE: This sheet is a summary. It may not cover all possible information. If you have questions about this medicine, talk to your doctor, pharmacist, or health care provider.  2021 Elsevier/Gold Standard (2012-11-26 12:54:16)  Diphenhydramine capsules or tablets What is this medicine? DIPHENHYDRAMINE (dye fen HYE dra meen) is an antihistamine. It is used to treat the symptoms of an allergic reaction. It is also used to treat Parkinson's disease. This medicine is also used to prevent and to treat motion sickness and as a nighttime sleep  aid. This medicine may be used for other purposes; ask your health care provider or pharmacist if you have questions. COMMON BRAND NAME(S): Alka-Seltzer Plus Allergy, Aller-G-Time, Banophen, Benadryl Allergy, Benadryl Allergy Dye Free, Benadryl Allergy Kapgel, Benadryl Allergy Ultratab, Diphedryl, Diphenhist, Genahist, Geri-Dryl, PHARBEDRYL, Q-Dryl, Gretta Began, Valu-Dryl, Vicks ZzzQuil Nightime Sleep-Aid What should I tell my health care provider before I take this medicine? They need to know if you have any of these conditions:  asthma or lung disease  glaucoma  high blood pressure or heart disease  liver disease  pain or difficulty passing urine  prostate trouble  ulcers or other stomach problems  an unusual or allergic reaction to diphenhydramine, other medicines foods, dyes, or preservatives such as sulfites  pregnant or trying to get pregnant  breast-feeding How should I use this medicine? Take this medicine by mouth with a full glass of water. Follow the directions on the prescription label. Take your doses at regular intervals. Do not take your medicine more often than directed. To prevent motion sickness start taking this medicine 30 to 60 minutes before you leave. Talk to your pediatrician regarding the use of this medicine in children. Special care may be needed. Patients over 73 years old may have a stronger reaction and need a smaller dose. Overdosage: If you think you have taken too much of this medicine contact a poison control center or emergency room at once. NOTE: This medicine is only for you. Do not share this medicine with others. What if I miss a dose? If you miss a dose, take it as soon as you can. If it is almost time for your next dose, take only that dose. Do not take double or extra doses. What may interact with this medicine? Do not take this medicine with any of the following medications:  MAOIs like Carbex, Eldepryl, Marplan, Nardil, and Parnate This  medicine may also interact with the following medications:  alcohol  barbiturates, like phenobarbital  medicines for bladder spasm like oxybutynin, tolterodine  medicines for blood pressure  medicines for depression, anxiety, or psychotic disturbances  medicines for movement abnormalities or Parkinson's disease  medicines for sleep  other medicines for cold, cough or allergy  some medicines for the stomach like chlordiazepoxide, dicyclomine This list may not describe all possible interactions. Give your health care provider a list of all the medicines, herbs, non-prescription drugs, or dietary supplements you use. Also tell them if you smoke, drink alcohol, or use illegal drugs. Some items may interact with your medicine. What should I watch for while using this medicine? Visit your doctor or health care professional for regular check ups. Tell your doctor if your symptoms do not improve or if they get worse. Your mouth may get dry. Chewing sugarless gum or sucking hard candy, and drinking plenty of water may help.  Contact your doctor if the problem does not go away or is severe. This medicine may cause dry eyes and blurred vision. If you wear contact lenses you may feel some discomfort. Lubricating drops may help. See your eye doctor if the problem does not go away or is severe. You may get drowsy or dizzy. Do not drive, use machinery, or do anything that needs mental alertness until you know how this medicine affects you. Do not stand or sit up quickly, especially if you are an older patient. This reduces the risk of dizzy or fainting spells. Alcohol may interfere with the effect of this medicine. Avoid alcoholic drinks. What side effects may I notice from receiving this medicine? Side effects that you should report to your doctor or health care professional as soon as possible:  allergic reactions like skin rash, itching or hives, swelling of the face, lips, or tongue  changes in  vision  confused, agitated, nervous  irregular or fast heartbeat  tremor  trouble passing urine  unusual bleeding or bruising  unusually weak or tired Side effects that usually do not require medical attention (report to your doctor or health care professional if they continue or are bothersome):  constipation, diarrhea  drowsy  headache  loss of appetite  stomach upset, vomiting  thick mucous This list may not describe all possible side effects. Call your doctor for medical advice about side effects. You may report side effects to FDA at 1-800-FDA-1088. Where should I keep my medicine? Keep out of the reach of children. This medicine can be abused. Keep your medicine in a safe place. Store at room temperature between 15 and 30 degrees C (59 and 86 degrees F). Keep container closed tightly. Throw away any unused medicine after the expiration date. NOTE: This sheet is a summary. It may not cover all possible information. If you have questions about this medicine, talk to your doctor, pharmacist, or health care provider.  2021 Elsevier/Gold Standard (2019-01-11 10:18:35)  Trastuzumab injection for infusion What is this medicine? TRASTUZUMAB (tras TOO zoo mab) is a monoclonal antibody. It is used to treat breast cancer and stomach cancer. This medicine may be used for other purposes; ask your health care provider or pharmacist if you have questions. COMMON BRAND NAME(S): Herceptin, Galvin Proffer, Trazimera What should I tell my health care provider before I take this medicine? They need to know if you have any of these conditions:  heart disease  heart failure  lung or breathing disease, like asthma  an unusual or allergic reaction to trastuzumab, benzyl alcohol, or other medications, foods, dyes, or preservatives  pregnant or trying to get pregnant  breast-feeding How should I use this medicine? This drug is given as an infusion into a vein.  It is administered in a hospital or clinic by a specially trained health care professional. Talk to your pediatrician regarding the use of this medicine in children. This medicine is not approved for use in children. Overdosage: If you think you have taken too much of this medicine contact a poison control center or emergency room at once. NOTE: This medicine is only for you. Do not share this medicine with others. What if I miss a dose? It is important not to miss a dose. Call your doctor or health care professional if you are unable to keep an appointment. What may interact with this medicine? This medicine may interact with the following medications:  certain types of chemotherapy, such as daunorubicin, doxorubicin,  epirubicin, and idarubicin This list may not describe all possible interactions. Give your health care provider a list of all the medicines, herbs, non-prescription drugs, or dietary supplements you use. Also tell them if you smoke, drink alcohol, or use illegal drugs. Some items may interact with your medicine. What should I watch for while using this medicine? Visit your doctor for checks on your progress. Report any side effects. Continue your course of treatment even though you feel ill unless your doctor tells you to stop. Call your doctor or health care professional for advice if you get a fever, chills or sore throat, or other symptoms of a cold or flu. Do not treat yourself. Try to avoid being around people who are sick. You may experience fever, chills and shaking during your first infusion. These effects are usually mild and can be treated with other medicines. Report any side effects during the infusion to your health care professional. Fever and chills usually do not happen with later infusions. Do not become pregnant while taking this medicine or for 7 months after stopping it. Women should inform their doctor if they wish to become pregnant or think they might be pregnant.  Women of child-bearing potential will need to have a negative pregnancy test before starting this medicine. There is a potential for serious side effects to an unborn child. Talk to your health care professional or pharmacist for more information. Do not breast-feed an infant while taking this medicine or for 7 months after stopping it. Women must use effective birth control with this medicine. What side effects may I notice from receiving this medicine? Side effects that you should report to your doctor or health care professional as soon as possible:  allergic reactions like skin rash, itching or hives, swelling of the face, lips, or tongue  chest pain or palpitations  cough  dizziness  feeling faint or lightheaded, falls  fever  general ill feeling or flu-like symptoms  signs of worsening heart failure like breathing problems; swelling in your legs and feet  unusually weak or tired Side effects that usually do not require medical attention (report to your doctor or health care professional if they continue or are bothersome):  bone pain  changes in taste  diarrhea  joint pain  nausea/vomiting  weight loss This list may not describe all possible side effects. Call your doctor for medical advice about side effects. You may report side effects to FDA at 1-800-FDA-1088. Where should I keep my medicine? This drug is given in a hospital or clinic and will not be stored at home. NOTE: This sheet is a summary. It may not cover all possible information. If you have questions about this medicine, talk to your doctor, pharmacist, or health care provider.  2021 Elsevier/Gold Standard (2016-03-29 14:37:52)

## 2020-09-24 ENCOUNTER — Inpatient Hospital Stay: Payer: Managed Care, Other (non HMO) | Attending: Internal Medicine

## 2020-09-24 ENCOUNTER — Inpatient Hospital Stay (HOSPITAL_BASED_OUTPATIENT_CLINIC_OR_DEPARTMENT_OTHER): Payer: Managed Care, Other (non HMO) | Admitting: Internal Medicine

## 2020-09-24 ENCOUNTER — Inpatient Hospital Stay: Payer: Managed Care, Other (non HMO)

## 2020-09-24 ENCOUNTER — Encounter: Payer: Self-pay | Admitting: Internal Medicine

## 2020-09-24 DIAGNOSIS — Z170 Estrogen receptor positive status [ER+]: Secondary | ICD-10-CM | POA: Diagnosis not present

## 2020-09-24 DIAGNOSIS — C50411 Malignant neoplasm of upper-outer quadrant of right female breast: Secondary | ICD-10-CM

## 2020-09-24 DIAGNOSIS — M8580 Other specified disorders of bone density and structure, unspecified site: Secondary | ICD-10-CM | POA: Diagnosis not present

## 2020-09-24 DIAGNOSIS — M85852 Other specified disorders of bone density and structure, left thigh: Secondary | ICD-10-CM

## 2020-09-24 LAB — CBC WITH DIFFERENTIAL/PLATELET
Abs Immature Granulocytes: 0.04 10*3/uL (ref 0.00–0.07)
Basophils Absolute: 0.1 10*3/uL (ref 0.0–0.1)
Basophils Relative: 2 %
Eosinophils Absolute: 0.2 10*3/uL (ref 0.0–0.5)
Eosinophils Relative: 4 %
HCT: 37 % (ref 36.0–46.0)
Hemoglobin: 12 g/dL (ref 12.0–15.0)
Immature Granulocytes: 1 %
Lymphocytes Relative: 24 %
Lymphs Abs: 1.1 10*3/uL (ref 0.7–4.0)
MCH: 28.8 pg (ref 26.0–34.0)
MCHC: 32.4 g/dL (ref 30.0–36.0)
MCV: 88.7 fL (ref 80.0–100.0)
Monocytes Absolute: 0.5 10*3/uL (ref 0.1–1.0)
Monocytes Relative: 11 %
Neutro Abs: 2.7 10*3/uL (ref 1.7–7.7)
Neutrophils Relative %: 58 %
Platelets: 190 10*3/uL (ref 150–400)
RBC: 4.17 MIL/uL (ref 3.87–5.11)
RDW: 13.5 % (ref 11.5–15.5)
WBC: 4.6 10*3/uL (ref 4.0–10.5)
nRBC: 0 % (ref 0.0–0.2)

## 2020-09-24 LAB — COMPREHENSIVE METABOLIC PANEL
ALT: 54 U/L — ABNORMAL HIGH (ref 0–44)
AST: 46 U/L — ABNORMAL HIGH (ref 15–41)
Albumin: 4.2 g/dL (ref 3.5–5.0)
Alkaline Phosphatase: 64 U/L (ref 38–126)
Anion gap: 12 (ref 5–15)
BUN: 20 mg/dL (ref 6–20)
CO2: 24 mmol/L (ref 22–32)
Calcium: 9.5 mg/dL (ref 8.9–10.3)
Chloride: 104 mmol/L (ref 98–111)
Creatinine, Ser: 0.77 mg/dL (ref 0.44–1.00)
GFR, Estimated: >60 mL/min (ref >60)
Glucose, Bld: 125 mg/dL — ABNORMAL HIGH (ref 70–99)
Potassium: 4 mmol/L (ref 3.5–5.1)
Sodium: 140 mmol/L (ref 135–145)
Total Bilirubin: 0.8 mg/dL (ref 0.3–1.2)
Total Protein: 6.9 g/dL (ref 6.5–8.1)

## 2020-09-24 MED ORDER — SODIUM CHLORIDE 0.9 % IV SOLN
Freq: Once | INTRAVENOUS | Status: AC
  Filled 2020-09-24: qty 250

## 2020-09-24 MED ORDER — ZOLEDRONIC ACID 4 MG/100ML IV SOLN
4.0000 mg | Freq: Once | INTRAVENOUS | Status: AC
  Administered 2020-09-24: 4 mg via INTRAVENOUS
  Filled 2020-09-24: qty 100

## 2020-09-24 MED ORDER — SODIUM CHLORIDE 0.9% FLUSH
10.0000 mL | INTRAVENOUS | Status: DC | PRN
  Administered 2020-09-24: 10 mL via INTRAVENOUS
  Filled 2020-09-24: qty 10

## 2020-09-24 MED ORDER — HEPARIN SOD (PORK) LOCK FLUSH 100 UNIT/ML IV SOLN
INTRAVENOUS | Status: AC
  Filled 2020-09-24: qty 5

## 2020-09-24 MED ORDER — HEPARIN SOD (PORK) LOCK FLUSH 100 UNIT/ML IV SOLN
500.0000 [IU] | Freq: Once | INTRAVENOUS | Status: AC
  Administered 2020-09-24: 500 [IU] via INTRAVENOUS
  Filled 2020-09-24: qty 5

## 2020-09-24 NOTE — Assessment & Plan Note (Addendum)
#  Right breast-invasive mammary carcinoma- STAGE I- TRIPLE POSITIVE; s/p  Herceptin maintenance [May 2022]. On Anastrazole. STABLE.   # OSTEOPENIA -BMD [ dec 2021-T-score of -1.2].- on adjuvant zometa q 6 M [jan 6th,0222]; STABLE. Again discussed the rationale and potential side effects.  # Right chest wall rash/Shingles-resolved; continue on Valtrex 500 mg once a day [sec prophy]; stable  # SWOG study/PN study- PN- G-1 monitor for now.  # Fatty liver- on [place]- AST/ALT [56/68] -grade 1 slightly elevated; STABLE.   # mild ankle swelling- likley dependant; clinically not suggestive of CHF; BNP -WNL.  # port-again reviewed/ discussed re: port ex-plantation; will keep for now for zometa.  Patient will let us know.  # Disposition: # Zometa  # in 3 month- port flush # follow up in 6 months  MD; labs- cbc/cmp; Zometa;Dr.B

## 2020-09-24 NOTE — Progress Notes (Signed)
[name] NOTE  Patient Care Team: Birdie Sons, MD as PCP - General (Family Medicine) [facility] (Gynecology) Theodore Demark, RN as Oncology Nurse Navigator Cammie Sickle, MD as Consulting Physician (Hematology and Oncology)  CHIEF COMPLAINTS/PURPOSE OF CONSULTATION: Breast cancer  #  Oncology History Overview Note  # MARCH 2021- RUOQ- 33m- [BREAST, RIGHT, LATERAL POSTERIOR  - INVASIVE MAMMARY CARCINOMA, NO SPECIAL TYPE, ASSOCIATED WITH  CALCIFICATIONS.][place] ER-> 90%; PR- 50-90%; HER 2 FISH- POSITIVE  # S/P LUMPECTOMY [Dr.Cintron] pT1a [459m;pN0 [STAGE IA- ER/PRpoS; Her 2 POS]  . BREAST, RIGHT, LATERAL MIDDLE DEPTH (RIBBON-SHAPED CLIP):  STEREOTACTIC-GUIDED CORE BIOPSY: - DUCTAL CARCINOMA IN SITU (DCIS), HIGH-GRADE, ASSOCIATED WITH  CALCIFICATIONS.   # May 14th,2021- TH-H; s/p RT [finished OCT 2021]; NOV 23rd 2021- start Anastrazole; ; s/p  Herceptin maintenance [May 2022].  #Hypothyroidism-methimazole [Dr.Solum]  # SURVIVORSHIP:   # GENETICS:   DIAGNOSIS: Breast cancer  STAGE:   1      ;  GOALS: Cure  CURRENT/MOST RECENT THERAPY : Taxol Herceptin   Carcinoma of upper-outer quadrant of right breast in female, estrogen receptor positive ([place] 07/[place] Initial Diagnosis   Carcinoma of upper-outer quadrant of right breast in female, estrogen receptor positive ([facility]  08/28/2019 -  Chemotherapy    Patient is on Treatment Plan: BREAST WEEKLY PACLITAXEL / TRASTUZUMAB / MAINTENANCE TRASTUZUMAB EVERY 21 DAYS         HISTORY OF PRESENTING ILLNESS:  Connie Bradford 89.o.  female with breast cancer early stage-ER/PR positive HER-2/neu positive on adjuvant anastrozole/Hzometa is here for follow-up.  Patient finished her Herceptin maintenance 3 weeks ago.   Continues to have mild swelling in the legs.  Denies any chest pain or shortness of breath or cough.  Review of Systems  Constitutional:  Positive for malaise/fatigue. Negative  for chills, diaphoresis, fever and weight loss.  HENT:  Negative for nosebleeds and sore throat.   Eyes:  Negative for double vision.  Respiratory:  Negative for cough, hemoptysis, sputum production, shortness of breath and wheezing.   Cardiovascular:  Positive for leg swelling. Negative for chest pain, palpitations and orthopnea.  Gastrointestinal:  Negative for abdominal pain, blood in stool, constipation, diarrhea, heartburn, melena, nausea and vomiting.  Genitourinary:  Negative for dysuria, frequency and urgency.  Musculoskeletal:  Negative for back pain and joint pain.  Skin:  Negative for itching.  Neurological:  Negative for dizziness, focal weakness, weakness and headaches.  Endo/Heme/Allergies:  Does not bruise/bleed easily.  Psychiatric/Behavioral:  Negative for depression. The patient is not nervous/anxious and does not have insomnia.     MEDICAL HISTORY:  Past Medical History:  Diagnosis Date   Atypical mole 09/26/2018   right mid anterior side/mild   Basal cell carcinoma 08/25/2014   right med cheek infraorbital   [place]ast [place]   [facility] [place] [place]   Dyslipidemia    Family history of bladder cancer    Graves disease    Hyperthyroidism    Inborn lipid storage disorder    Leiomyoma of uterus    Personal history of chemotherapy 2021   RIGHT lumpectomy   Personal history of radiation therapy 2021   RIGHT lumpectomy   Vitamin D deficiency     SURGICAL HISTORY: Past Surgical History:  Procedure Laterality Date   APPENDECTOMY  1982   Cyst removed from ovaries    BREAST BIOPSY Right 07/05/2019   [facility] BI[facility]SY Right 07/05/2019  stereo bx, ribbon clip, DCIS    BREAST LUMPECTOMY Right 07/31/2019    [facility] and DCIS with SN bx   COLONOSCOPY WITH PROPOFOL N/A 01/20/2017   Procedure: COLONOSCOPY WITH PROPOFOL;  Surgeon: Jonathon Bellows, MD;  Location[facility] ENDOSCOPY;  Service: Gastroenterology;  Laterality: N/A;   [place]   NECK SURGERY  2013   LIft from weight loss   PARTIAL MASTECTOMY WITH NEEDLE LOCALIZATION AND AXILLARY SENTINEL LYMPH NODE BX Right 07/31/2019   Procedure: RIGHT PARTIAL MASTECTOMY WITH BRACKETED NEEDLE LOCALIZATION AND LEFT AXILLARY SENTINEL LYMPH NODE BIOPSY;  Surgeon: Herbert Pun, MD;  Location: [facility] ORS;  Service: General;  Laterality: Right;   PORTACATH PLACEMENT Left 07/31/2019   Procedure: INSERTION PORT-A-CATH LEFT INTERNAL JUGULAR;  Surgeon: Herbert Pun, MD;  Location: [facility] ORS;  Service: General;  Laterality: Left;   TONSILLECTOMY  1969    SOCIAL HISTORY: Social History   Socioeconomic History   Marital status: Married    Spouse name: Not on file   Number of children: 0   Years of education: Coll Grad   Highest education level: Not on file  Occupational History   Occupation: Full-Time    Comment: [employer] x9  Tobacco Use   Smoking status: Former    Pack years: 0.00   Smokeless tobacco: Never  Vaping Use   Vaping Use: Never used  Substance and Sexual Activity   Alcohol use: Yes    Alcohol/week: 0.0 standard drinks    Comment: RARELY   Drug use: No   Sexual activity: Yes    Birth control/protection: Post-menopausal  Other Topics Concern   Not on file  Social History Narrative   Lives close to [place]; with husband. Quit smoking in 2007 [25 years]; ocassional alcohol. 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    Social Determinants of Health   Financial Resource Strain: Not on file  Food Insecurity: Not on file  Transportation Needs: Not on file  Physical Activity: Not on file  Stress: Not on file  Social Connections: Not on file  Intimate Partner Violence: Not on file    FAMILY HISTORY: Family History  Problem Relation Age of Onset   Bladder Cancer Mother    Diabetes Father    Heart disease Father    Bladder Cancer Brother    Breast cancer Neg Hx     ALLERGIES:  is allergic to penicillins.  MEDICATIONS:  Current  Outpatient Medications  Medication Sig Dispense Refill   anastrozole (ARIMIDEX) 1 MG tablet Take 1 tablet (1 mg total) by mouth daily. 90 tablet 1   B COMPLEX VITAMINS PO Take 1 tablet by mouth daily.      chlorhexidine (PERIDEX) 0.12 % solution USE 15MLS IN THE MOUTH OR THROAT TWICE DAILY AS DIRECTED 473 mL 0   Cholecalciferol (VITAMIN D) 125 MCG (5000 UT) CAPS Take 5,000 Units by mouth daily.      COLLAGEN PO Take 1 tablet by mouth daily. Super Collagen w/Biotin     ibuprofen (ADVIL) 200 MG tablet Take 600 mg by mouth every 8 (eight) hours as needed (for pain.).      lidocaine-prilocaine (EMLA) cream Apply 1 application topically as needed. 30 g 0   methimazole (TAPAZOLE) 5 MG tablet Take 5 mg by mouth daily.      Multiple Vitamin (MULTIVITAMIN WITH MINERALS) TABS tablet Take 1 tablet by mouth daily.     Omega 3-6-9 Fatty Acids (OMEGA 3-6-9 COMPLEX) CAPS Take 1 capsule by mouth daily.  ondansetron (ZOFRAN) 8 MG tablet One pill every 8 hours as needed for nausea/vomitting. 40 tablet 1   prochlorperazine (COMPAZINE) 10 MG tablet Take 1 tablet (10 mg total) by mouth every 6 (six) hours as needed for nausea or vomiting. 40 tablet 1   valACYclovir (VALTREX) 500 MG tablet TAKE ONE TABLET BY MOUTH DAILY 30 tablet 11   No current facility-administered medications for this visit.   Facility-Administered Medications Ordered in Other Visits  Medication Dose Route Frequency Provider Last Rate Last Admin   heparin lock flush 100 unit/mL  500 Units Intravenous Once [name] R, MD       sodium chloride flush (NS) 0.9 % injection 10 mL  10 mL Intravenous PRN [name] R, MD   10 mL at 09/24/20 0859   Zoledronic Acid (ZOMETA) IVPB 4 mg  4 mg Intravenous Once [name] R, MD 400 mL/hr at 09/24/20 0958 4 mg at 09/24/20 0958    PHYSICAL EXAMINATION: ECOG PERFORMANCE STATUS: 0 - Asymptomatic  Vitals:   09/24/20 0907  BP: 129/63  Pulse: 86  Resp: 16  Temp: 97.6 F (36.4  C)  SpO2: 99%   Filed Weights   09/24/20 0907  Weight: 182 lb (82.6 kg)    Physical Exam HENT:     Head: Normocephalic and atraumatic.     Mouth/Throat:     Pharynx: No oropharyngeal exudate.  Eyes:     Pupils: Pupils are equal, round, and reactive to light.  Cardiovascular:     Rate and Rhythm: Normal rate and regular rhythm.  Pulmonary:     Effort: Pulmonary effort is normal. No respiratory distress.     Breath sounds: Normal breath sounds. No wheezing.  Abdominal:     General: Bowel sounds are normal. There is no distension.     Palpations: Abdomen is soft. There is no mass.     Tenderness: no abdominal tenderness There is no guarding or rebound.  Musculoskeletal:        General: No tenderness. Normal range of motion.     Cervical back: Normal range of motion and neck supple.  Skin:    General: Skin is warm.  Neurological:     Mental Status: She is alert and oriented to person, place, and time.  Psychiatric:        Mood and Affect: Affect normal.   LABORATORY DATA:  I have reviewed the data as listed Lab Results  Component Value Date   WBC 4.6 09/24/2020   HGB 12.0 09/24/2020   HCT 37.0 09/24/2020   MCV 88.7 09/24/2020   PLT 190 09/24/2020   Recent Labs    11/20/19 0903 12/18/19 0840 01/01/20 0859 01/22/20 0905 08/13/20 0841 09/03/20 0843 09/24/20 0859  NA 139 143 139   < > 141 140 140  K 4.0 4.1 4.1   < > 4.1 3.9 4.0  CL 107 109 104   < > 104 106 104  CO2 _0 < > _1 GLUCOSE 98 105* 106*   < > 157* 117* 125*  BUN 22* 14 16   < > _2 CREATININE 0.83 0.72 0.81   < > 0.96 0.75 0.77  CALCIUM 9.6 9.0 9.5   < > 9.4 9.1 9.5  GFRNONAA >60 >60 >60   < > >60 >60 >60  GFRAA >60 >60 >60  --   --   --   --   PROT 6.7 6.6 7.1   < >  6.9 6.9 6.9  ALBUMIN 4.1 4.0 4.2   < > 4.2 4.0 4.2  AST 32 36 39   < > 56* 33 46*  ALT 36 35 39   < > 68* 36 54*  ALKPHOS 72 76 76   < > 57 60 64  BILITOT 0.8 0.5 0.8   < > 1.0 1.0 0.8   < > = values in this  interval not displayed.    RADIOGRAPHIC STUDIES: I have personally reviewed the radiological images as listed and agreed with the findings in the report. No results found.  ASSESSMENT & PLAN:   Carcinoma of upper-outer quadrant of right breast in female, estrogen receptor positive ([place]) #Right breast-invasive mammary carcinoma- STAGE I- TRIPLE POSITIVE; s/p  Herceptin maintenance [May 2022]. On Anastrazole. STABLE.   # OSTEOPENIA -BMD [ dec 2021-T-score of -1.2].- on adjuvant zometa q 6 M [jan 6th,0222]; STABLE. Again discussed the rationale and potential side effects.  # Right chest wall rash/Shingles-resolved; continue on Valtrex 500 mg once a day [sec prophy]; stable  # SWOG study/PN study- PN- G-1 monitor for now.  # Fatty liver- on [place]- AST/ALT [56/68] -grade 1 slightly elevated; STABLE.   # mild ankle swelling- likley dependant; clinically not suggestive of CHF; BNP -WNL.  # port-again reviewed/ discussed re: port ex-plantation; will keep for now for zometa.  Patient will let us know.  # Disposition: # Zometa  # in 3 month- port flush # follow up in 6 months  MD; labs- cbc/cmp; Zometa;Dr.B  All questions were answered. The patient/family knows to call the clinic with any problems, questions or concerns.    Cammie Sickle, MD 09/24/2020 10:12 AM

## 2020-10-08 ENCOUNTER — Ambulatory Visit (INDEPENDENT_AMBULATORY_CARE_PROVIDER_SITE_OTHER): Payer: Managed Care, Other (non HMO) | Admitting: Dermatology

## 2020-10-08 ENCOUNTER — Other Ambulatory Visit: Payer: Self-pay

## 2020-10-08 DIAGNOSIS — Z1283 Encounter for screening for malignant neoplasm of skin: Secondary | ICD-10-CM

## 2020-10-08 DIAGNOSIS — Z86018 Personal history of other benign neoplasm: Secondary | ICD-10-CM

## 2020-10-08 DIAGNOSIS — D2271 Melanocytic nevi of right lower limb, including hip: Secondary | ICD-10-CM

## 2020-10-08 DIAGNOSIS — D492 Neoplasm of unspecified behavior of bone, soft tissue, and skin: Secondary | ICD-10-CM

## 2020-10-08 DIAGNOSIS — D225 Melanocytic nevi of trunk: Secondary | ICD-10-CM | POA: Diagnosis not present

## 2020-10-08 DIAGNOSIS — Z85828 Personal history of other malignant neoplasm of skin: Secondary | ICD-10-CM

## 2020-10-08 DIAGNOSIS — L814 Other melanin hyperpigmentation: Secondary | ICD-10-CM

## 2020-10-08 DIAGNOSIS — L821 Other seborrheic keratosis: Secondary | ICD-10-CM

## 2020-10-08 DIAGNOSIS — L578 Other skin changes due to chronic exposure to nonionizing radiation: Secondary | ICD-10-CM

## 2020-10-08 DIAGNOSIS — L719 Rosacea, unspecified: Secondary | ICD-10-CM | POA: Diagnosis not present

## 2020-10-08 DIAGNOSIS — D1800 Hemangioma unspecified site: Secondary | ICD-10-CM

## 2020-10-08 DIAGNOSIS — Q825 Congenital non-neoplastic nevus: Secondary | ICD-10-CM

## 2020-10-08 DIAGNOSIS — D229 Melanocytic nevi, unspecified: Secondary | ICD-10-CM

## 2020-10-08 DIAGNOSIS — Z853 Personal history of malignant neoplasm of breast: Secondary | ICD-10-CM

## 2020-10-08 NOTE — Progress Notes (Signed)
Follow-Up Visit   Subjective  Connie Bradford is a 60 y.o. female who presents for the following: Total body skin exam (Hx of BCC R med cheek infraorbital, Hx of Dysplastic nevus R mid ant side). The patient presents for Total-Body Skin Exam (TBSE) for skin cancer screening and mole check.  The following portions of the chart were reviewed this encounter and updated as appropriate:   Tobacco  Allergies  Meds  Problems  Med Hx  Surg Hx  Fam Hx      Review of Systems:  No other skin or systemic complaints except as noted in HPI or Assessment and Plan.  Objective  Well appearing patient in no apparent distress; mood and affect are within normal limits.  A full examination was performed including scalp, head, eyes, ears, nose, lips, neck, chest, axillae, abdomen, back, buttocks, bilateral upper extremities, bilateral lower extremities, hands, feet, fingers, toes, fingernails, and toenails. All findings within normal limits unless otherwise noted below.  R med cheek infraorbital Well healed scar with no evidence of recurrence.   R mid ant side Scar with no evidence of recurrence.   face Mild erythema face  Right mid back at braline Brown macule 2.5 x 1.2cm       Left Axilla Irregular brown macule 0.5cm     R mid sole  R mid sole 4.0 x 5.65mm regular brown macule        Assessment & Plan   Lentigines - Scattered tan macules - Due to sun exposure - Benign-appering, observe - Recommend daily broad spectrum sunscreen SPF 30+ to sun-exposed areas, reapply every 2 hours as needed. - Call for any changes  Seborrheic Keratoses - Stuck-on, waxy, tan-brown papules and/or plaques  - Benign-appearing - Discussed benign etiology and prognosis. - Observe - Call for any changes  Melanocytic Nevi - Tan-brown and/or pink-flesh-colored symmetric macules and papules - Benign appearing on exam today - Observation - Call clinic for new or changing moles -  Recommend daily use of broad spectrum spf 30+ sunscreen to sun-exposed areas.   Hemangiomas - Red papules - Discussed benign nature - Observe - Call for any changes  Actinic Damage - Chronic condition, secondary to cumulative UV/sun exposure - diffuse scaly erythematous macules with underlying dyspigmentation - Recommend daily broad spectrum sunscreen SPF 30+ to sun-exposed areas, reapply every 2 hours as needed.  - Staying in the shade or wearing long sleeves, sun glasses (UVA+UVB protection) and wide brim hats (4-inch brim around the entire circumference of the hat) are also recommended for sun protection.  - Call for new or changing lesions.  Skin cancer screening performed today.  History of basal cell carcinoma (BCC) R med cheek infraorbital  Clear. Observe for recurrence. Call clinic for new or changing lesions.  Recommend regular skin exams, daily broad-spectrum spf 30+ sunscreen use, and photoprotection.    History of dysplastic nevus R mid ant side  Clear. Observe for recurrence. Call clinic for new or changing lesions.  Recommend regular skin exams, daily broad-spectrum spf 30+ sunscreen use, and photoprotection.    Rosacea face  Rosacea is a chronic progressive skin condition usually affecting the face of adults, causing redness and/or acne bumps. It is treatable but not curable. It sometimes affects the eyes (ocular rosacea) as well. It may respond to topical and/or systemic medication and can flare with stress, sun exposure, alcohol, exercise and some foods.  Daily application of broad spectrum spf 30+ sunscreen to face is recommended to reduce  flares.  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  Observation.  Call clinic for new or changing moles.  Recommend daily use of broad spectrum spf 30+ sunscreen to sun-exposed areas.    Neoplasm of skin Left Axilla  Epidermal / dermal shaving  Lesion  diameter (cm):  0.5 Informed consent: discussed and consent obtained   Timeout: patient name, date of birth, surgical site, and procedure verified   Procedure prep:  Patient was prepped and draped in usual sterile fashion Prep type:  Isopropyl alcohol Anesthesia: the lesion was anesthetized in a standard fashion   Anesthetic:  1% lidocaine w/ epinephrine 1-100,000 buffered w/ 8.4% NaHCO3 Instrument used: flexible razor blade   Hemostasis achieved with: pressure, aluminum chloride and electrodesiccation   Outcome: patient tolerated procedure well   Post-procedure details: sterile dressing applied and wound care instructions given   Dressing type: bandage and bacitracin    Specimen 1 - Surgical pathology Differential Diagnosis: D48.5 Nevus vs Dysplastic nevus  Check Margins: yes Irregular brown macule 0.5cm  Nevus R mid sole  Benign appearing observe  Skin cancer screening Hx of Breast Ca - no lymphadenopathy  Return in about 1 year (around 10/08/2021) for TBSE, Hx of BCC, Hx of Dysplastic nevi.  I, Othelia Pulling, RMA, am acting as scribe for Sarina Ser, MD . Documentation: I have reviewed the above documentation for accuracy and completeness, and I agree with the above.  Sarina Ser, MD

## 2020-10-08 NOTE — Patient Instructions (Addendum)
If you have any questions or concerns for your doctor, please call our main line at [phone] and press option 4 to reach your doctor's medical assistant. If no one answers, please leave a voicemail as directed and we will return your call as soon as possible. Messages left after 4 pm will be answered the following business day.   You may also send us a message via MyChart. We typically respond to MyChart messages within 1-2 business days.  For prescription refills, please ask your pharmacy to contact our office. Our fax number is [phone].  If you have an urgent issue when the clinic is closed that cannot wait until the next business day, you can page your doctor at the number below.    Please note that while we do our best to be available for urgent issues outside of office hours, we are not available 24/7.   If you have an urgent issue and are unable to reach us, you may choose to seek medical care at your doctor's office, retail clinic, urgent care center, or emergency room.  If you have a medical emergency, please immediately call 911 or go to the emergency department.  Pager Numbers  - Dr. Kowalski: [phone]  - Dr. Moye: [phone]  - Dr. Stewart: [phone]  In the event of inclement weather, please call our main line at [phone] for an update on the status of any delays or closures.  Dermatology Medication Tips: Please keep the boxes that topical medications come in in order to help keep track of the instructions about where and how to use these. Pharmacies typically print the medication instructions only on the boxes and not directly on the medication tubes.   If your medication is too expensive, please contact our office at [phone] option 4 or send us a message through MyChart.   We are unable to tell what your co-pay for medications will be in advance as this is different depending on your insurance coverage. However, we may be able to find a substitute  medication at lower cost or fill out paperwork to get insurance to cover a needed medication.   If a prior authorization is required to get your medication covered by your insurance company, please allow us 1-2 business days to complete this process.  Drug prices often vary depending on where the prescription is filled and some pharmacies may offer cheaper prices.  The website www.goodrx.com contains coupons for medications through different pharmacies. The prices here do not account for what the cost may be with help from insurance (it may be cheaper with your insurance), but the website can give you the price if you did not use any insurance.  - You can print the associated coupon and take it with your prescription to the pharmacy.  - You may also stop by our office during regular business hours and pick up a GoodRx coupon card.  - If you need your prescription sent electronically to a different pharmacy, notify our office through [place] MyChart or by phone at [phone] option 4.   Wound Care Instructions  Cleanse wound gently with soap and water once a day then pat dry with clean gauze. Apply a thing coat of Petrolatum (petroleum jelly, "Vaseline") over the wound (unless you have an allergy to this). We recommend that you use a new, sterile tube of Vaseline. Do not pick or remove scabs. Do not remove the yellow or white "healing tissue" from the base of the wound.  Cover the   wound with fresh, clean, nonstick gauze and secure with paper tape. You may use Band-Aids in place of gauze and tape if the would is Haze enough, but would recommend trimming much of the tape off as there is often too much. Sometimes Band-Aids can irritate the skin.  You should call the office for your biopsy report after 1 week if you have not already been contacted.  If you experience any problems, such as abnormal amounts of bleeding, swelling, significant bruising, significant pain, or evidence of infection,  please call the office immediately.  FOR ADULT SURGERY PATIENTS: If you need something for pain relief you may take 1 extra strength Tylenol (acetaminophen) AND 2 Ibuprofen (200mg each) together every 4 hours as needed for pain. (do not take these if you are allergic to them or if you have a reason you should not take them.) Typically, you may only need pain medication for 1 to 3 days.    

## 2020-10-22 ENCOUNTER — Encounter: Payer: Self-pay | Admitting: Dermatology

## 2020-10-22 ENCOUNTER — Telehealth: Payer: Self-pay

## 2020-10-22 NOTE — Telephone Encounter (Signed)
-----   Message from Brendolyn Patty, MD sent at 10/21/2020  8:56 PM EDT ----- Skin , left axilla MELANOCYTIC NEVUS, JUNCTIONAL LENTIGINOUS TYPE, LIMITED MARGINS FREE  Benign mole - please call pt

## 2020-10-22 NOTE — Telephone Encounter (Signed)
Left message for patient to call office for results/hd 

## 2020-10-22 NOTE — Telephone Encounter (Signed)
Patient advised of BX results.  °

## 2020-12-03 ENCOUNTER — Other Ambulatory Visit: Payer: Self-pay

## 2020-12-03 ENCOUNTER — Encounter: Payer: Self-pay | Admitting: Internal Medicine

## 2020-12-03 ENCOUNTER — Inpatient Hospital Stay: Payer: Managed Care, Other (non HMO) | Attending: Oncology | Admitting: Nurse Practitioner

## 2020-12-03 ENCOUNTER — Encounter: Payer: Self-pay | Admitting: Nurse Practitioner

## 2020-12-03 DIAGNOSIS — U071 COVID-19: Secondary | ICD-10-CM | POA: Diagnosis not present

## 2020-12-03 MED ORDER — NIRMATRELVIR/RITONAVIR (PAXLOVID)TABLET
3.0000 | ORAL_TABLET | Freq: Two times a day (BID) | ORAL | 0 refills | Status: AC
  Filled 2020-12-03: qty 30, 5d supply, fill #0

## 2020-12-03 NOTE — Progress Notes (Signed)
Virtual Visit Progress Note  Symptom Management Clinic [facility]  [phone] [phone]  I connected with Kaeleigh Melucci Ask on 12/03/20 at 10:20 AM EDT by video enabled telemedicine visit and verified that I am speaking with the correct person using two identifiers.   I discussed the limitations, risks, security and privacy concerns of performing an evaluation and management service by telemedicine and the availability of in-person appointments. I also discussed with the patient that there may be a patient responsible charge related to this service. The patient expressed understanding and agreed to proceed.   Other persons participating in the visit and their role in the encounter: None   Patient's location: home  Provider's location: [facility]     Patient Care Team: Birdie Sons, MD as PCP - General (Family Medicine) [facility] Gyn (Gynecology) Theodore Demark, RN as Oncology Nurse Navigator Cammie Sickle, MD as Consulting Physician (Hematology and Oncology)   Name of the patient: Connie Bradford  OE:[id number]  1961-02-07   Date of visit: 12/03/20  Diagnosis- Breast cancer  Chief complaint/ Reason for visit- Covid Positive  Heme/Onc history:  Oncology History Overview Note  # MARCH 2021- RUOQ- 80m- [BREAST, RIGHT, LATERAL POSTERIOR  - INVASIVE MAMMARY CARCINOMA, NO SPECIAL TYPE, ASSOCIATED WITH  CALCIFICATIONS.][place] ER-> 90%; PR- 50-90%; HER 2 FISH- POSITIVE  # S/P LUMPECTOMY [Dr.Cintron] pT1a [495m;pN0 [STAGE IA- ER/PRpoS; Her 2 POS]  . BREAST, RIGHT, LATERAL MIDDLE DEPTH (RIBBON-SHAPED CLIP):  STEREOTACTIC-GUIDED CORE BIOPSY: - DUCTAL CARCINOMA IN SITU (DCIS), HIGH-GRADE, ASSOCIATED WITH  CALCIFICATIONS.   # May 14th,2021- TH-H; s/p RT [finished OCT 2021]; NOV 23rd 2021- start Anastrazole; ; s/p  Herceptin maintenance [May 2022].  #Hypothyroidism-methimazole [Dr.Solum]  # SURVIVORSHIP:   # GENETICS:   DIAGNOSIS: Breast  cancer  STAGE:   1      ;  GOALS: Cure  CURRENT/MOST RECENT THERAPY : Taxol Herceptin   Carcinoma of upper-outer quadrant of right breast in female, estrogen receptor positive ([facility] 07/15/[place] Initial Diagnosis   Carcinoma of upper-outer quadrant of right breast in female, estrogen receptor positive ([facility]  08/28/2019 -  Chemotherapy    Patient is on Treatment Plan: BREAST WEEKLY PACLITAXEL / TRASTUZUMAB / MAINTENANCE TRASTUZUMAB EVERY 21 DAYS         Interval history- Patient presents for evaluation of symptoms due to covid infection. She started having symptoms 2 days ago. Onset was malaise, congestion. She was tested yesterday and tested positive for covid. Taking otc medications for symptoms. Had fever today of 101.   Review of systems- Review of Systems  Constitutional:  Positive for chills, fever and malaise/fatigue. Negative for diaphoresis and weight loss.  HENT:  Positive for congestion and sore throat. Negative for ear discharge, ear pain, nosebleeds and sinus pain.   Eyes:  Negative for pain and redness.  Respiratory:  Positive for cough. Negative for hemoptysis, sputum production, shortness of breath, wheezing and stridor.   Cardiovascular:  Negative for chest pain, palpitations and leg swelling.  Gastrointestinal:  Negative for abdominal pain, constipation, diarrhea, nausea and vomiting.  Musculoskeletal:  Negative for falls, joint pain and myalgias.  Skin:  Negative for rash.  Neurological:  Positive for headaches. Negative for dizziness, loss of consciousness and weakness.  Psychiatric/Behavioral:  Negative for depression. The patient is nervous/anxious. The patient does not have insomnia.   All other systems reviewed and are negative.   Current treatment- s/p herceptin in May 2022  Allergies  Allergen Reactions  Penicillins Rash    Past Medical History:  Diagnosis Date   Atypical mole 09/26/2018   right mid anterior side/mild   Basal cell carcinoma  08/25/2014   right med cheek infraorbital   Breast cancer ([place]) 07/2019   [place] and DCIS   Dyslipidemia    Family history of bladder cancer    Graves disease    Hyperthyroidism    Inborn lipid storage disorder    Leiomyoma of uterus    Personal history of chemotherapy 2021   RIGHT lumpectomy   Personal history of radiation therapy 2021   RIGHT lumpectomy   Vitamin D deficiency     Past Surgical History:  Procedure Laterality Date   APPENDECTOMY  1982   Cyst removed from ovaries    BREAST BIOPSY Right 07/05/2019   stereo bx, coil clip, [facility]   BREAST BIOPSY Right 07/05/2019   stereo bx, ribbon clip, DCIS    BREAST LUMPECTOMY Right 07/31/2019    [facility] and DCIS with SN bx   COLONOSCOPY WITH PROPOFOL N/A 01/20/2017   Procedure: COLONOSCOPY WITH PROPOFOL;  Surgeon: Jonathon Bellows, MD;  Location: [facility] ENDOSCOPY;  Service: Gastroenterology;  Laterality: N/A;   [place]   NECK SURGERY  2013   LIft from weight loss   PARTIAL MASTECTOMY WITH NEEDLE LOCALIZATION AND AXILLARY SENTINEL LYMPH NODE BX Right 07/31/2019   Procedure: RIGHT PARTIAL MASTECTOMY WITH BRACKETED NEEDLE LOCALIZATION AND LEFT AXILLARY SENTINEL LYMPH NODE BIOPSY;  Surgeon: Herbert Pun, MD;  Location: [facility] ORS;  Service: General;  Laterality: Right;   PORTACATH PLACEMENT Left 07/31/2019   Procedure: INSERTION PORT-A-CATH LEFT INTERNAL JUGULAR;  Surgeon: Herbert Pun, MD;  Location: [facility] ORS;  Service: General;  Laterality: Left;   TONSILLECTOMY  1969    Social History   Socioeconomic History   Marital status: Married    Spouse name: Not on file   Number of children: 0   Years of education: Coll Grad   Highest education level: Not on file  Occupational History   Occupation: Full-Time    Comment: [employer]  Tobacco Use   Smoking status: Former   Smokeless tobacco: Never  Scientific laboratory technician Use: Never used  Substance and Sexual Activity   Alcohol use:  Yes    Alcohol/week: 0.0 standard drinks    Comment: RARELY   Drug use: No   Sexual activity: Yes    Birth control/protection: Post-menopausal  Other Topics Concern   Not on file  Social History Narrative   Lives close to [place]; with husband. Quit smoking in 2007 [25 years]; ocassional alcohol. 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    Social Determinants of Health   Financial Resource Strain: Not on file  Food Insecurity: Not on file  Transportation Needs: Not on file  Physical Activity: Not on file  Stress: Not on file  Social Connections: Not on file  Intimate Partner Violence: Not on file    Family History  Problem Relation Age of Onset   Bladder Cancer Mother    Diabetes Father    Heart disease Father    Bladder Cancer Brother    Breast cancer Neg Hx      Current Outpatient Medications:    anastrozole (ARIMIDEX) 1 MG tablet, Take 1 tablet (1 mg total) by mouth daily., Disp: 90 tablet, Rfl: 1   B COMPLEX VITAMINS PO, Take 1 tablet by mouth daily. , Disp: , Rfl:    Cholecalciferol (VITAMIN D) 125 MCG (5000  UT) CAPS, Take 5,000 Units by mouth daily. , Disp: , Rfl:    COLLAGEN PO, Take 1 tablet by mouth daily. Super Collagen w/Biotin, Disp: , Rfl:    ibuprofen (ADVIL) 200 MG tablet, Take 600 mg by mouth every 8 (eight) hours as needed (for pain.). , Disp: , Rfl:    lidocaine-prilocaine (EMLA) cream, Apply 1 application topically as needed., Disp: 30 g, Rfl: 0   methimazole (TAPAZOLE) 5 MG tablet, Take 5 mg by mouth daily. , Disp: , Rfl:    Multiple Vitamin (MULTIVITAMIN WITH MINERALS) TABS tablet, Take 1 tablet by mouth daily., Disp: , Rfl:    Omega 3-6-9 Fatty Acids (OMEGA 3-6-9 COMPLEX) CAPS, Take 1 capsule by mouth daily., Disp: , Rfl:    ondansetron (ZOFRAN) 8 MG tablet, One pill every 8 hours as needed for nausea/vomitting., Disp: 40 tablet, Rfl: 1   prochlorperazine (COMPAZINE) 10 MG tablet, Take 1 tablet (10 mg total) by mouth every 6 (six) hours as needed for nausea or vomiting.,  Disp: 40 tablet, Rfl: 1   valACYclovir (VALTREX) 500 MG tablet, TAKE ONE TABLET BY MOUTH DAILY, Disp: 30 tablet, Rfl: 11   chlorhexidine (PERIDEX) 0.12 % solution, USE 15MLS IN THE MOUTH OR THROAT TWICE DAILY AS DIRECTED (Patient not taking: Reported on 12/03/2020), Disp: 473 mL, Rfl: 0  Physical exam: Exam limited due to telemedicine  There were no vitals filed for this visit. Physical Exam Constitutional:      General: She is not in acute distress. HENT:     Head: Normocephalic.     Nose: Congestion present.  Pulmonary:     Effort: No respiratory distress.  Neurological:     Mental Status: She is alert and oriented to person, place, and time.  Psychiatric:        Mood and Affect: Mood normal.        Behavior: Behavior normal.     CMP Latest Ref Rng & Units 09/24/2020  Glucose 70 - 99 mg/dL 125(H)  BUN 6 - 20 mg/dL 20  Creatinine 0.44 - 1.00 mg/dL 0.77  Sodium 135 - 145 mmol/L 140  Potassium 3.5 - 5.1 mmol/L 4.0  Chloride 98 - 111 mmol/L 104  CO2 22 - 32 mmol/L 24  Calcium 8.9 - 10.3 mg/dL 9.5  Total Protein 6.5 - 8.1 g/dL 6.9  Total Bilirubin 0.3 - 1.2 mg/dL 0.8  Alkaline Phos 38 - 126 U/L 64  AST 15 - 41 U/L 46(H)  ALT 0 - 44 U/L 54(H)   CBC Latest Ref Rng & Units 09/24/2020  WBC 4.0 - 10.5 K/uL 4.6  Hemoglobin 12.0 - 15.0 g/dL 12.0  Hematocrit 36.0 - 46.0 % 37.0  Platelets 150 - 400 K/uL 190    No images are attached to the encounter.  No results found.  Assessment and plan- Patient is a 60 y.o. female   1) Covid-19 Virus Infection - patient meets criteria for being at high risk of severe infection. Recommend oral paxlovid. Labs reviewed and acceptable for treatment.  - I advised patient to stay well hydrated, particularly in the event of sustained or high fevers, in whom insensible fluid losses may be significant  - Cough that is persistent, interferes with sleep, or causes discomfort can be managed with an over-the-counter cough medication (eg, dextromethorphan)   - I recommend rest as needed during the acute illness and frequent repositioning ambulation is encouraged. In addition, we encourage all patients to advance activity as soon as tolerated during recovery. -  take mucinex to thin secretions - take tylenol and/or ibuprofen per label as needed for pain/fever/ body aches. Do not exceed 3000 mg in 24 hour period of tylenol - Reviewed symptoms that would warrant ER or hospital care - encouraged patient to monitor oxygen levels with pulse oximeter, reviewed troubleshooting, and to seek ER for SpO2 < 94% - CDC criteria for quarantine and isolation reviewed - In non-hospitalized patients, we do not treat COVID-19 with dexamethasone, prednisone, or other corticosteroids. Per the literature, extrapolating from the results of studies of hospitalized patients, there is no evidence that corticosteroids benefit patients without a supplemental oxygen requirement, and further, they may be associated with poorer clinical outcomes - There is not high-quality data that suggests that supplementation with vitamin C, vitamin D, or zinc reduces the severity of COVID-19 in non-hospitalized patients - For patients with documented COVID-19, we do not routinely administer empiric therapy for bacterial pneumonia. Data are limited, but bacterial superinfection does not appear to be a prominent feature of COVID-19  2)  Chest congestion  - Flonase or nasocort as needed. Afrin for nasal congestion no longer than 5 days.   Follow up prn  Visit Diagnosis 1. COVID-19 virus infection     Patient expressed understanding and was in agreement with this plan. She also understands that She can call clinic at any time with any questions, concerns, or complaints.   I discussed the assessment and treatment plan with the patient. The patient was provided an opportunity to ask questions and all were answered. The patient agreed with the plan and demonstrated an understanding of the  instructions.   The patient was advised to call back or seek an in-person evaluation if the symptoms worsen or if the condition fails to improve as anticipated.   I spent 20 minutes face-to-face video visit time dedicated to the care of this patient on the date of this encounter to include pre-visit review of medical oncology notes, labs, medications, face-to-face time with the patient, and post visit ordering of testing/documentation.   Thank you for allowing me to participate in the care of this very pleasant patient.   Beckey Rutter, DNP, AGNP-C [facility]

## 2020-12-03 NOTE — Progress Notes (Signed)
RN called pt, she tested positive for covid yesterday.  States having fever of 101 this morning, chills, body aches, as well as congestion. RN instructed that NP would do a virtual visit with pt at 1020 this morning.  Pt verbalized understanding.  RN instructed pt to call if she had any further concerns. Pt verbalized understanding.

## 2020-12-03 NOTE — Patient Instructions (Signed)
You are being prescribed PAXLOVID for COVID-19 infection.   Please pick up your prescription at: [facility]  Address:  [facility], [address] [place], [place] [address]      Hours:  Monday - Friday 7:30 am - 5:30 pm.  Saturday Closed   Sunday Closed  Phone: [phone]   Please call the pharmacy or go through the drive through vs going inside if you are picking up the mediation yourself to prevent further spread. If prescribed to a [facility] affiliated pharmacy, a pharmacist will bring the medication out to your car.  ADMINISTRATION INSTRUCTIONS: Take with or without food. Swallow the tablets whole. Don't chew, crush, or break the medications because it might not work as well  For each dose of the medication, you should be taking 3 tablets TWICE a day for FIVE days   Finish your full five-day course of Paxlovid even if you feel better before you're done. Stopping this medication too early can make it less effective to prevent severe illness related to [place].    Paxlovid is prescribed for YOU ONLY. Don't share it with others, even if they have similar symptoms as you. This medication might not be right for everyone.  Make sure to take steps to protect yourself and others while you're taking this medication in order to get well soon and to prevent others from getting sick with COVID-19.  Paxlovid (nirmatrelvir / ritonavir) can cause hormonal birth control medications to not work well. If you or your partner is currently taking hormonal birth control, use condoms or other birth control methods to prevent unintended pregnancies.  COMMON SIDE EFFECTS: Altered or bad taste in your mouth  Diarrhea  High blood pressure (1% of people) Muscle aches (1% of people)   If your COVID-19 symptoms get worse, get medical help right away. Call 911 if you experience symptoms such as worsening cough, trouble breathing, chest pain that doesn't go away, confusion, a hard  time staying awake, and pale or blue-colored skin. This medication won't prevent all COVID-19 cases from getting worse.

## 2020-12-25 ENCOUNTER — Other Ambulatory Visit: Payer: Self-pay

## 2020-12-25 ENCOUNTER — Inpatient Hospital Stay: Payer: Managed Care, Other (non HMO) | Attending: Internal Medicine

## 2020-12-25 DIAGNOSIS — Z95828 Presence of other vascular implants and grafts: Secondary | ICD-10-CM

## 2020-12-25 DIAGNOSIS — C50411 Malignant neoplasm of upper-outer quadrant of right female breast: Secondary | ICD-10-CM | POA: Diagnosis present

## 2020-12-25 DIAGNOSIS — Z170 Estrogen receptor positive status [ER+]: Secondary | ICD-10-CM | POA: Insufficient documentation

## 2020-12-25 DIAGNOSIS — Z452 Encounter for adjustment and management of vascular access device: Secondary | ICD-10-CM | POA: Diagnosis not present

## 2020-12-25 MED ORDER — HEPARIN SOD (PORK) LOCK FLUSH 100 UNIT/ML IV SOLN
500.0000 [IU] | Freq: Once | INTRAVENOUS | Status: AC
  Administered 2020-12-25: 500 [IU] via INTRAVENOUS
  Filled 2020-12-25: qty 5

## 2020-12-25 MED ORDER — SODIUM CHLORIDE 0.9% FLUSH
10.0000 mL | Freq: Once | INTRAVENOUS | Status: AC
  Administered 2020-12-25: 10 mL via INTRAVENOUS
  Filled 2020-12-25: qty 10

## 2020-12-28 ENCOUNTER — Other Ambulatory Visit: Payer: Self-pay | Admitting: General Surgery

## 2020-12-28 DIAGNOSIS — Z853 Personal history of malignant neoplasm of breast: Secondary | ICD-10-CM

## 2021-01-20 ENCOUNTER — Other Ambulatory Visit: Payer: Self-pay

## 2021-01-20 ENCOUNTER — Ambulatory Visit
Admission: RE | Admit: 2021-01-20 | Discharge: 2021-01-20 | Disposition: A | Discharge status: Home / Self Care | Payer: Managed Care, Other (non HMO) | Source: Ambulatory Visit | Attending: General Surgery | Admitting: General Surgery

## 2021-01-20 ENCOUNTER — Inpatient Hospital Stay: Admission: RE | Admit: 2021-01-20 | Payer: Managed Care, Other (non HMO) | Source: Ambulatory Visit

## 2021-01-20 DIAGNOSIS — Z853 Personal history of malignant neoplasm of breast: Secondary | ICD-10-CM | POA: Diagnosis not present

## 2021-02-03 ENCOUNTER — Other Ambulatory Visit: Payer: Self-pay

## 2021-02-03 ENCOUNTER — Encounter: Payer: Self-pay | Admitting: Radiation Oncology

## 2021-02-03 ENCOUNTER — Ambulatory Visit
Admission: RE | Admit: 2021-02-03 | Discharge: 2021-02-03 | Disposition: A | Discharge status: Home / Self Care | Payer: Managed Care, Other (non HMO) | Source: Ambulatory Visit | Attending: Radiation Oncology | Admitting: Radiation Oncology

## 2021-02-03 VITALS — BP 137/76 | HR 71 | Temp 97.2°F | Resp 16 | Wt 189.0 lb

## 2021-02-03 DIAGNOSIS — Z170 Estrogen receptor positive status [ER+]: Secondary | ICD-10-CM | POA: Insufficient documentation

## 2021-02-03 DIAGNOSIS — Z79811 Long term (current) use of aromatase inhibitors: Secondary | ICD-10-CM | POA: Insufficient documentation

## 2021-02-03 DIAGNOSIS — Z923 Personal history of irradiation: Secondary | ICD-10-CM | POA: Diagnosis not present

## 2021-02-03 DIAGNOSIS — C50411 Malignant neoplasm of upper-outer quadrant of right female breast: Secondary | ICD-10-CM

## 2021-02-03 NOTE — Progress Notes (Signed)
Survivorship Care Plan visit completed.  Treatment summary reviewed and given to patient.  ASCO answers booklet reviewed and given to patient.  CARE program and Cancer Transitions discussed with patient along with other resources cancer center offers to patients and caregivers.  Patient verbalized understanding.    

## 2021-02-03 NOTE — Progress Notes (Signed)
Radiation Oncology Follow up Note  Name: Connie Bradford   Date:   02/03/2021 MRN:  [id number] DOB: Aug 26, 1960    This 60 y.o. female presents to the clinic today for 1 year follow-up status post whole breast radiation to her right breast for stage Ia triple positive invasive mammary carcinoma.  REFERRING PROVIDER: Birdie Sons, MD  HPI: Patient is a 60 year old female now out 1 year having completed whole breast radiation to her right breast for stage Ia triple positive invasive mammary carcinoma.  Seen today in routine follow-up she is doing well.  She specifically denies breast tenderness cough or bone pain..  She recently this month had mammograms which I have reviewed showing stable probably benign calcifications repeat mammogram in 6 months was ordered.  This is a BI-RADS Category 3 probably benign.  She is currently on Arimidex tolerating it well without side effects.  COMPLICATIONS OF TREATMENT: none  FOLLOW UP COMPLIANCE: keeps appointments   PHYSICAL EXAM:  BP 137/76   Pulse 71   Temp (!) 97.2 F (36.2 C) (Tympanic)   Resp 16   Wt 189 lb (85.7 kg)   LMP 12/18/2013   BMI 30.51 kg/m  Lungs are clear to A&P cardiac examination essentially unremarkable with regular rate and rhythm. No dominant mass or nodularity is noted in either breast in 2 positions examined. Incision is well-healed. No axillary or supraclavicular adenopathy is appreciated. Cosmetic result is excellent.  Well-developed well-nourished patient in NAD. HEENT reveals PERLA, EOMI, discs not visualized.  Oral cavity is clear. No oral mucosal lesions are identified. Neck is clear without evidence of cervical or supraclavicular adenopathy. Lungs are clear to A&P. Cardiac examination is essentially unremarkable with regular rate and rhythm without murmur rub or thrill. Abdomen is benign with no organomegaly or masses noted. Motor sensory and DTR levels are equal and symmetric in the upper and lower extremities. Cranial  nerves II through XII are grossly intact. Proprioception is intact. No peripheral adenopathy or edema is identified. No motor or sensory levels are noted. Crude visual fields are within normal range.  RADIOLOGY RESULTS: Mammograms reviewed compatible with above-stated findings  PLAN: Present time patient is doing well with no evidence of disease 1 year out and pleased with her overall progress.  Right breast is slightly smaller than the left although cosmetic result is still good to excellent.  I have asked to see her back in 1 year for follow-up.  She continues on Arimidex without side effect.  Patient knows to call with any concerns.  I would like to take this opportunity to thank you for allowing me to participate in the care of your patient.Noreene Filbert, MD

## 2021-02-23 ENCOUNTER — Other Ambulatory Visit: Payer: Self-pay

## 2021-02-23 ENCOUNTER — Encounter: Payer: Managed Care, Other (non HMO) | Attending: Family Medicine

## 2021-02-23 VITALS — Ht 66.5 in | Wt 190.4 lb

## 2021-02-23 DIAGNOSIS — Z170 Estrogen receptor positive status [ER+]: Secondary | ICD-10-CM

## 2021-02-23 DIAGNOSIS — C50411 Malignant neoplasm of upper-outer quadrant of right female breast: Secondary | ICD-10-CM

## 2021-02-23 NOTE — Progress Notes (Signed)
Daily Session Note  Patient Details  Name: Connie Bradford MRN: [id number] Date of Birth: [birth year] Referring Provider:   [name] [facility] Associated Rehabilitation & Exercise from 02/23/2021 in [facility] Cardiac and Pulmonary Rehab  Referring Provider [name] MD       Encounter Date: 02/23/2021  Check In:  Session Check In - 02/23/21 1403       Check-In   Supervising physician immediately available to respond to emergencies See telemetry face sheet for immediately available ER MD    Location [facility]-Cardiac & Pulmonary Rehab    Staff Present Coralie Keens, MS, ASCM CEP, Exercise Physiologist;Kelly Rosalia Hammers, MPA, RN    Virtual Visit No    Medication changes reported     No    Fall or balance concerns reported    No    Warm-up and Cool-down Not performed (comment)   6MWT and Gym Orientation   Resistance Training Performed No    VAD Patient? No    PAD/SET Patient? No      Pain Assessment   Currently in Pain? No/denies              6 Minute Walk     Row Name 02/23/21 1404         6 Minute Walk   Phase Initial     Distance 1507 feet     Walk Time 6 minutes     # of Rest Breaks 0     MPH 2.85     METS 4.02     RPE 10     Perceived Dyspnea  0     VO2 Peak 14.08     Symptoms No     Resting HR 70 bpm     Resting BP 132/80     Resting Oxygen Saturation  98 %     Exercise Oxygen Saturation  during 6 min walk 95 %     Max Ex. HR 108 bpm     Max Ex. BP 156/86     2 Minute Post BP 140/78               Initial Exercise Prescription - 02/23/21 1400       Date of Initial Exercise RX and Referring Provider   Date 02/23/21    Referring Provider [name] MD      Oxygen   Maintain Oxygen Saturation 88% or higher      Treadmill   MPH 2.8    Grade 1    Minutes 15    METs 3.53      Recumbant Bike   Level 2    RPM 60    Watts 30    Minutes 15    METs 4      NuStep   Level 3    SPM 80    Minutes 15    METs 4      REL-XR   Level  2    Speed 50    Minutes 15    METs 4      Prescription Details   Frequency (times per week) 2    Duration Progress to 30 minutes of continuous aerobic without signs/symptoms of physical distress      Intensity   THRR 40-80% of Max Heartrate 106-143    Ratings of Perceived Exertion 11-13    Perceived Dyspnea 0-4      Progression   Progression Continue to progress workloads to maintain intensity without signs/symptoms of physical distress.  Resistance Training   Training Prescription Yes    Weight 4 lb    Reps 10-15                Social History   Tobacco Use  Smoking Status Former  Smokeless Tobacco Never    Goals Met:  Proper associated with RPD/PD & O2 Sat Exercise tolerated well Personal goals reviewed No report of concerns or symptoms today  Goals Unmet:  Not Applicable  Comments: First full day of orientation. All starting workloads were established based on the results of the 6 minute walk test done at initial orientation visit.  The plan for exercise progression was also introduced and progression will be customized based on patient's performance and goals.    Dr. [name] is Medical Director for [place].  Dr. [name] is Medical Director for [facility] Pulmonary Rehabilitation.

## 2021-02-25 ENCOUNTER — Other Ambulatory Visit: Payer: Self-pay

## 2021-02-25 DIAGNOSIS — Z170 Estrogen receptor positive status [ER+]: Secondary | ICD-10-CM

## 2021-02-25 DIAGNOSIS — C50411 Malignant neoplasm of upper-outer quadrant of right female breast: Secondary | ICD-10-CM

## 2021-02-25 NOTE — Progress Notes (Signed)
Daily Session Note  Patient Details  Name: Connie Bradford MRN: [id number] Date of Birth: 16-Dec-1960 Referring Provider:   [name] Cancer Associated Rehabilitation & Exercise from 02/23/2021 in [facility] Cardiac and Pulmonary Rehab  Referring Provider [name] MD       Encounter Date: 02/25/2021  Check In:  Session Check In - 02/25/21 1231       Check-In   Supervising physician immediately available to respond to emergencies See telemetry face sheet for immediately available ER MD    Location [facility]-Cardiac & Pulmonary Rehab    Staff Present Birdie Sons, MPA, Nino Glow, MS, ASCM CEP, Exercise Physiologist;Amanda Oletta Darter, BA, ACSM CEP, Exercise Physiologist    Virtual Visit No    Medication changes reported     No    Fall or balance concerns reported    No    Warm-up and Cool-down Performed on first and last piece of equipment    Resistance Training Performed Yes    VAD Patient? No    PAD/SET Patient? No      Pain Assessment   Currently in Pain? No/denies                Social History   Tobacco Use  Smoking Status Former  Smokeless Tobacco Never    Goals Met:  Independence with exercise equipment Exercise tolerated well No report of concerns or symptoms today Strength training completed today  Goals Unmet:  Not Applicable  Comments: First full day of exercise!  Patient was oriented to gym and equipment including functions, settings, policies, and procedures.  Patient's individual exercise prescription and treatment plan were reviewed.  All starting workloads were established based on the results of the 6 minute walk test done at initial orientation visit.  The plan for exercise progression was also introduced and progression will be customized based on patient's performance and goals.     Dr. [name] is Medical Director for [place].  Dr. [name] is Medical Director for [facility] Pulmonary  Rehabilitation.

## 2021-03-02 ENCOUNTER — Other Ambulatory Visit: Payer: Self-pay

## 2021-03-02 DIAGNOSIS — Z170 Estrogen receptor positive status [ER+]: Secondary | ICD-10-CM

## 2021-03-02 DIAGNOSIS — C50411 Malignant neoplasm of upper-outer quadrant of right female breast: Secondary | ICD-10-CM

## 2021-03-02 NOTE — Progress Notes (Signed)
Daily Session Note  Patient Details  Name: Connie Bradford MRN: [id number] Date of Birth: 08-Dec-1960 Referring Provider:   [name] Cancer Associated Rehabilitation & Exercise from 02/23/2021 in [facility] Cardiac and Pulmonary Rehab  Referring Provider [name] MD       Encounter Date: 03/02/2021  Check In:  Session Check In - 03/02/21 1212       Check-In   Supervising physician immediately available to respond to emergencies See telemetry face sheet for immediately available ER MD    Location [facility]-Cardiac & Pulmonary Rehab    Staff Present Birdie Sons, MPA, RN;Jessica Luan Pulling, MA, RCEP, CCRP, Marylynn Pearson, MS, ASCM CEP, Exercise Physiologist    Virtual Visit No    Medication changes reported     No    Fall or balance concerns reported    No    Warm-up and Cool-down Performed on first and last piece of equipment    Resistance Training Performed Yes    VAD Patient? No    PAD/SET Patient? No      Pain Assessment   Currently in Pain? No/denies                Social History   Tobacco Use  Smoking Status Former  Smokeless Tobacco Never    Goals Met:  Independence with exercise equipment Exercise tolerated well No report of concerns or symptoms today Strength training completed today  Goals Unmet:  Not Applicable  Comments: Pt able to follow exercise prescription today without complaint.  Will continue to monitor for progression.    Dr. [name] is Medical Director for [place].  Dr. [name] is Medical Director for [facility] Pulmonary Rehabilitation.

## 2021-03-04 ENCOUNTER — Other Ambulatory Visit: Payer: Self-pay

## 2021-03-04 DIAGNOSIS — Z170 Estrogen receptor positive status [ER+]: Secondary | ICD-10-CM

## 2021-03-04 NOTE — Progress Notes (Signed)
Daily Session Note  Patient Details  Name: Connie Bradford MRN: [id number] Date of Birth: 1960/08/15 Referring Provider:   [name] Cancer Associated Rehabilitation & Exercise from 02/23/2021 in [facility] Cardiac and Pulmonary Rehab  Referring Provider [name] MD       Encounter Date: 03/04/2021  Check In:  Session Check In - 03/04/21 1245       Check-In   Supervising physician immediately available to respond to emergencies See telemetry face sheet for immediately available ER MD    Location [facility]Cardiac & Pulmonary Rehab    Staff Present Connie Keens, MS, ASCM CEP, Exercise Physiologist;Connie Bradford, BA, ACSM CEP, Exercise Physiologist    Virtual Visit No    Medication changes reported     No    Fall or balance concerns reported    No    Warm-up and Cool-down Performed on first and last piece of equipment    Resistance Training Performed Yes   yoga   VAD Patient? No                Social History   Tobacco Use  Smoking Status Former  Smokeless Tobacco Never    Goals Met:  Proper associated with RPD/PD & O2 Sat Exercise tolerated well No report of concerns or symptoms today  Goals Unmet:  Not Applicable  Comments: Pt able to follow exercise prescription today without complaint.  Will continue to monitor for progression.   Yoga completed today by Exercise Physiologist, Connie Bradford   Dr. [name] is Medical Director for [place].  Dr. [name] is Medical Director for [facility] Pulmonary Rehabilitation.

## 2021-03-08 ENCOUNTER — Telehealth: Payer: Self-pay | Admitting: Internal Medicine

## 2021-03-08 NOTE — Telephone Encounter (Signed)
Pt called in stating that she may need a preauthorzion for her chemo. Please give her a call at [phone]

## 2021-03-08 NOTE — Telephone Encounter (Signed)
Patient received an EOB from insurance states the medication received during treatment was denied.  Message sent to PA department.

## 2021-03-08 NOTE — Telephone Encounter (Signed)
Message on patient's voicemail informing her that I have confirmed with Seth Bake (ins auth dept) that the Herceptin is authorized and zometa does not require auth.  Also advised that if she gets a bill to call the billing department that will be listed on the billing statement.

## 2021-03-09 ENCOUNTER — Other Ambulatory Visit: Payer: Self-pay

## 2021-03-09 DIAGNOSIS — C50411 Malignant neoplasm of upper-outer quadrant of right female breast: Secondary | ICD-10-CM

## 2021-03-09 DIAGNOSIS — Z170 Estrogen receptor positive status [ER+]: Secondary | ICD-10-CM

## 2021-03-09 NOTE — Progress Notes (Signed)
Daily Session Note  Patient Details  Name: Connie Bradford MRN: [id number] Date of Birth: [birth year] Referring Provider:   [name] Cancer Associated Rehabilitation & Exercise from 02/23/2021 in [facility] Cardiac and Pulmonary Rehab  Referring Provider [name] MD       Encounter Date: 03/09/2021  Check In:  Session Check In - 03/09/21 1219       Check-In   Supervising physician immediately available to respond to emergencies See telemetry face sheet for immediately available ER MD    Location [facility]-Cardiac & Pulmonary Rehab    Staff Present Birdie Sons, MPA, Nino Glow, MS, ASCM CEP, Exercise Physiologist;Amanda Oletta Darter, BA, ACSM CEP, Exercise Physiologist    Virtual Visit No    Medication changes reported     No    Fall or balance concerns reported    No    Warm-up and Cool-down Performed on first and last piece of equipment    Resistance Training Performed Yes    VAD Patient? No    PAD/SET Patient? No      Pain Assessment   Currently in Pain? No/denies                Social History   Tobacco Use  Smoking Status Former  Smokeless Tobacco Never    Goals Met:  Independence with exercise equipment Exercise tolerated well No report of concerns or symptoms today Strength training completed today  Goals Unmet:  Not Applicable  Comments: Pt able to follow exercise prescription today without complaint.  Will continue to monitor for progression.    Dr. [name] is Medical Director for [place].  Dr. [name] is Medical Director for [facility] Pulmonary Rehabilitation.

## 2021-03-15 ENCOUNTER — Other Ambulatory Visit: Payer: Self-pay | Admitting: Internal Medicine

## 2021-03-16 ENCOUNTER — Other Ambulatory Visit: Payer: Self-pay

## 2021-03-16 ENCOUNTER — Encounter: Payer: Self-pay | Admitting: Internal Medicine

## 2021-03-16 DIAGNOSIS — Z170 Estrogen receptor positive status [ER+]: Secondary | ICD-10-CM

## 2021-03-16 DIAGNOSIS — C50411 Malignant neoplasm of upper-outer quadrant of right female breast: Secondary | ICD-10-CM

## 2021-03-16 NOTE — Progress Notes (Signed)
Daily Session Note  Patient Details  Name: Connie Bradford MRN: [id number] Date of Birth: [birth year] Referring Provider:   [name] Cancer Associated Rehabilitation & Exercise from 02/23/2021 in [facility] Cardiac and Pulmonary Rehab  Referring Provider [name] MD       Encounter Date: 03/16/2021  Check In:  Session Check In - 03/16/21 1220       Check-In   Supervising physician immediately available to respond to emergencies See telemetry face sheet for immediately available ER MD    Location [facility]-Cardiac & Pulmonary Rehab    Staff Present Birdie Sons, MPA, Nino Glow, MS, ASCM CEP, Exercise Physiologist;Jessica Luan Pulling, MA, RCEP, CCRP, CCET    Virtual Visit No    Medication changes reported     No    Fall or balance concerns reported    No    Warm-up and Cool-down Performed on first and last piece of equipment    Resistance Training Performed Yes    VAD Patient? No    PAD/SET Patient? No      Pain Assessment   Currently in Pain? No/denies                Social History   Tobacco Use  Smoking Status Former  Smokeless Tobacco Never    Goals Met:  Independence with exercise equipment Exercise tolerated well No report of concerns or symptoms today Strength training completed today  Goals Unmet:  Not Applicable  Comments: Pt able to follow exercise prescription today without complaint.  Will continue to monitor for progression.    Dr. [name] is Medical Director for [place].  Dr. [name] is Medical Director for [facility] Pulmonary Rehabilitation.

## 2021-03-23 ENCOUNTER — Encounter: Payer: Managed Care, Other (non HMO) | Attending: Internal Medicine | Admitting: *Deleted

## 2021-03-23 ENCOUNTER — Other Ambulatory Visit: Payer: Self-pay

## 2021-03-23 DIAGNOSIS — Z170 Estrogen receptor positive status [ER+]: Secondary | ICD-10-CM | POA: Insufficient documentation

## 2021-03-23 DIAGNOSIS — C50411 Malignant neoplasm of upper-outer quadrant of right female breast: Secondary | ICD-10-CM | POA: Insufficient documentation

## 2021-03-23 NOTE — Progress Notes (Signed)
Daily Session Note  Patient Details  Name: Connie Bradford MRN: [id number] Date of Birth: 05/22/1960 Referring Provider:   [name] Cancer Associated Rehabilitation & Exercise from 02/23/2021 in [facility] Cardiac and Pulmonary Rehab  Referring Provider [name] MD       Encounter Date: 03/23/2021  Check In:  Session Check In - 03/23/21 1247       Check-In   Supervising physician immediately available to respond to emergencies See telemetry face sheet for immediately available ER MD    Location [facility]-Cardiac & Pulmonary Rehab    Staff Present Alberteen Sam, MA, RCEP, CCRP, Marylynn Pearson, MS, ASCM CEP, Exercise Physiologist    Virtual Visit No    Medication changes reported     No    Warm-up and Cool-down Performed on first and last piece of equipment    Resistance Training Performed Yes    VAD Patient? No    PAD/SET Patient? No      Pain Assessment   Currently in Pain? No/denies                Social History   Tobacco Use  Smoking Status Former  Smokeless Tobacco Never    Goals Met:  Proper associated with RPD/PD & O2 Sat Independence with exercise equipment Exercise tolerated well No report of concerns or symptoms today Strength training completed today  Goals Unmet:  Not Applicable  Comments: Pt able to follow exercise prescription today without complaint.  Will continue to monitor for progression.   Dr. [name] is Medical Director for [place].  Dr. [name] is Medical Director for [facility] Pulmonary Rehabilitation.

## 2021-03-26 ENCOUNTER — Inpatient Hospital Stay (HOSPITAL_BASED_OUTPATIENT_CLINIC_OR_DEPARTMENT_OTHER): Payer: Managed Care, Other (non HMO) | Admitting: Internal Medicine

## 2021-03-26 ENCOUNTER — Encounter: Payer: Self-pay | Admitting: Internal Medicine

## 2021-03-26 ENCOUNTER — Inpatient Hospital Stay: Payer: Managed Care, Other (non HMO) | Attending: Internal Medicine

## 2021-03-26 ENCOUNTER — Other Ambulatory Visit: Payer: Self-pay

## 2021-03-26 ENCOUNTER — Inpatient Hospital Stay: Payer: Managed Care, Other (non HMO)

## 2021-03-26 DIAGNOSIS — C50411 Malignant neoplasm of upper-outer quadrant of right female breast: Secondary | ICD-10-CM

## 2021-03-26 DIAGNOSIS — M85852 Other specified disorders of bone density and structure, left thigh: Secondary | ICD-10-CM

## 2021-03-26 DIAGNOSIS — M8580 Other specified disorders of bone density and structure, unspecified site: Secondary | ICD-10-CM | POA: Diagnosis not present

## 2021-03-26 DIAGNOSIS — Z170 Estrogen receptor positive status [ER+]: Secondary | ICD-10-CM

## 2021-03-26 LAB — CBC WITH DIFFERENTIAL/PLATELET
Abs Immature Granulocytes: 0.03 10*3/uL (ref 0.00–0.07)
Basophils Absolute: 0.1 10*3/uL (ref 0.0–0.1)
Basophils Relative: 1 %
Eosinophils Absolute: 0.2 10*3/uL (ref 0.0–0.5)
Eosinophils Relative: 4 %
HCT: 38.4 % (ref 36.0–46.0)
Hemoglobin: 12.5 g/dL (ref 12.0–15.0)
Immature Granulocytes: 1 %
Lymphocytes Relative: 21 %
Lymphs Abs: 1.3 10*3/uL (ref 0.7–4.0)
MCH: 29.8 pg (ref 26.0–34.0)
MCHC: 32.6 g/dL (ref 30.0–36.0)
MCV: 91.4 fL (ref 80.0–100.0)
Monocytes Absolute: 0.5 10*3/uL (ref 0.1–1.0)
Monocytes Relative: 9 %
Neutro Abs: 4.1 10*3/uL (ref 1.7–7.7)
Neutrophils Relative %: 64 %
Platelets: 197 10*3/uL (ref 150–400)
RBC: 4.2 MIL/uL (ref 3.87–5.11)
RDW: 13.2 % (ref 11.5–15.5)
WBC: 6.3 10*3/uL (ref 4.0–10.5)
nRBC: 0 % (ref 0.0–0.2)

## 2021-03-26 LAB — COMPREHENSIVE METABOLIC PANEL
ALT: 80 U/L — ABNORMAL HIGH (ref 0–44)
AST: 60 U/L — ABNORMAL HIGH (ref 15–41)
Albumin: 4.2 g/dL (ref 3.5–5.0)
Alkaline Phosphatase: 63 U/L (ref 38–126)
Anion gap: 12 (ref 5–15)
BUN: 14 mg/dL (ref 6–20)
CO2: 24 mmol/L (ref 22–32)
Calcium: 9.4 mg/dL (ref 8.9–10.3)
Chloride: 102 mmol/L (ref 98–111)
Creatinine, Ser: 0.74 mg/dL (ref 0.44–1.00)
GFR, Estimated: >60 mL/min (ref >60)
Glucose, Bld: 129 mg/dL — ABNORMAL HIGH (ref 70–99)
Potassium: 3.8 mmol/L (ref 3.5–5.1)
Sodium: 138 mmol/L (ref 135–145)
Total Bilirubin: 0.7 mg/dL (ref 0.3–1.2)
Total Protein: 7.1 g/dL (ref 6.5–8.1)

## 2021-03-26 MED ORDER — ZOLEDRONIC ACID 4 MG/100ML IV SOLN
4.0000 mg | Freq: Once | INTRAVENOUS | Status: AC
  Administered 2021-03-26: 4 mg via INTRAVENOUS
  Filled 2021-03-26: qty 100

## 2021-03-26 MED ORDER — HEPARIN SOD (PORK) LOCK FLUSH 100 UNIT/ML IV SOLN
500.0000 [IU] | Freq: Once | INTRAVENOUS | Status: AC | PRN
  Filled 2021-03-26: qty 5

## 2021-03-26 MED ORDER — SODIUM CHLORIDE 0.9 % IV SOLN
Freq: Once | INTRAVENOUS | Status: AC
  Filled 2021-03-26: qty 250

## 2021-03-26 MED ORDER — HEPARIN SOD (PORK) LOCK FLUSH 100 UNIT/ML IV SOLN
INTRAVENOUS | Status: AC
  Administered 2021-03-26: 500 [IU]
  Filled 2021-03-26: qty 5

## 2021-03-26 NOTE — Progress Notes (Signed)
[facility] [place] NOTE  Patient Care Team: Birdie Sons, MD as PCP - General (Family Medicine) [facility] Gyn (Gynecology) Theodore Demark, RN as Oncology Nurse Navigator Cammie Sickle, MD as Consulting Physician (Hematology and Oncology) Cammie Sickle, MD as Consulting Physician (Internal Medicine) Noreene Filbert, MD as Consulting Physician (Radiation Oncology) Herbert Pun, MD as Consulting Physician (General Surgery)  CHIEF COMPLAINTS/PURPOSE OF CONSULTATION: Breast cancer  #  Oncology History Overview Note  # MARCH 2021- RUOQ- 86m- [BREAST, RIGHT, LATERAL POSTERIOR  - INVASIVE MAMMARY CARCINOMA, NO SPECIAL TYPE, ASSOCIATED WITH  CALCIFICATIONS.][place] ER-> 90%; PR- 50-90%; HER 2 FISH- POSITIVE  # S/P LUMPECTOMY [Dr.Cintron] pT1a [458m;pN0 [STAGE IA- ER/PRpoS; Her 2 POS]  . BREAST, RIGHT, LATERAL MIDDLE DEPTH (RIBBON-SHAPED CLIP):  STEREOTACTIC-GUIDED CORE BIOPSY: - DUCTAL CARCINOMA IN SITU (DCIS), HIGH-GRADE, ASSOCIATED WITH  CALCIFICATIONS.   # May 14th,2021- TH-H; s/p RT [finished OCT 2021]; NOV 23rd 2021- start Anastrazole; ; s/p  Herceptin maintenance [May 2022].  #Hypothyroidism-methimazole [Dr.Solum]  # SURVIVORSHIP:   # GENETICS:   DIAGNOSIS: Breast cancer  STAGE:   1      ;  GOALS: Cure  CURRENT/MOST RECENT THERAPY : Taxol Herceptin   Carcinoma of upper-outer quadrant of right breast in female, estrogen receptor positive ([place] 07/15/2019 Initial Diagnosis   Carcinoma of upper-outer quadrant of right breast in female, estrogen receptor positive ([place]  08/28/2019 -  Chemotherapy    Patient is on Treatment Plan: BREAST WEEKLY PACLITAXEL / TRASTUZUMAB / MAINTENANCE TRASTUZUMAB EVERY 21 DAYS         HISTORY OF PRESENTING ILLNESS:  Connie Bradford [age].o.  female with breast cancer early stage-ER/PR positive HER-2/neu positive on adjuvant anastrozole/zometa is here for follow-up.  Patient denies any joint pains.  denies any significant hot flashes.  Denies any chest pain or shortness of breath or cough.  No nausea no vomiting.  Review of Systems  Constitutional:  Positive for malaise/fatigue. Negative for chills, diaphoresis, fever and weight loss.  HENT:  Negative for nosebleeds and sore throat.   Eyes:  Negative for double vision.  Respiratory:  Negative for cough, hemoptysis, sputum production, shortness of breath and wheezing.   Cardiovascular:  Positive for leg swelling. Negative for chest pain, palpitations and orthopnea.  Gastrointestinal:  Negative for abdominal pain, blood in stool, constipation, diarrhea, heartburn, melena, nausea and vomiting.  Genitourinary:  Negative for dysuria, frequency and urgency.  Musculoskeletal:  Negative for back pain and joint pain.  Skin:  Negative for itching.  Neurological:  Negative for dizziness, focal weakness, weakness and headaches.  Endo/Heme/Allergies:  Does not bruise/bleed easily.  Psychiatric/Behavioral:  Negative for depression. The patient is not nervous/anxious and does not have insomnia.     MEDICAL HISTORY:  Past Medical History:  Diagnosis Date   Atypical mole 09/26/2018   right mid anterior side/mild   Basal cell carcinoma 08/25/2014   right med cheek infraorbital   Breast cancer ([facility]/2021   [facility] [place] DCIS   Dyslipidemia    Family history of bladder cancer    Graves disease    Hyperthyroidism    Inborn lipid storage disorder    Leiomyoma of uterus    Personal history of chemotherapy 2021   RIGHT lumpectomy   Personal history of radiation therapy 2021   RIGHT lumpectomy   Vitamin D deficiency     SURGICAL HISTORY: Past Surgical History:  Procedure Laterality Date   APPENDECTOMY  1982   Cyst removed  from ovaries    BREAST BIOPSY Right 07/05/2019   stereo bx, coil clip, [facility]   BREAST BIOPSY Right 07/05/2019   stereo bx, ribbon clip, DCIS    BREAST LUMPECTOMY Right 07/31/2019    [facility] and DCIS with SN bx   COLONOSCOPY  WITH PROPOFOL N/A 01/20/2017   Procedure: COLONOSCOPY WITH PROPOFOL;  Surgeon: Jonathon Bellows, MD;  Location: [facility] ENDOSCOPY;  Service: Gastroenterology;  Laterality: N/A;   [place]   NECK SURGERY  2013   LIft from weight loss   PARTIAL MASTECTOMY WITH NEEDLE LOCALIZATION AND AXILLARY SENTINEL LYMPH NODE BX Right 07/31/2019   Procedure: RIGHT PARTIAL MASTECTOMY WITH BRACKETED NEEDLE LOCALIZATION AND LEFT AXILLARY SENTINEL LYMPH NODE BIOPSY;  Surgeon: Herbert Pun, MD;  Location: [facility] ORS;  Service: General;  Laterality: Right;   PORTACATH PLACEMENT Left 07/31/2019   Procedure: INSERTION PORT-A-CATH LEFT INTERNAL JUGULAR;  Surgeon: Herbert Pun, MD;  Location: [facility] ORS;  Service: General;  Laterality: Left;   TONSILLECTOMY  1969    SOCIAL HISTORY: Social History   Socioeconomic History   Marital status: Married    Spouse name: Not on file   Number of children: 0   Years of education: Coll Grad   Highest education level: Not on file  Occupational History   Occupation: Full-Time    Comment: [employer] [place] x9  Tobacco Use   Smoking status: Former   Smokeless tobacco: Never  Scientific laboratory technician Use: Never used  Substance and Sexual Activity   Alcohol use: Yes    Alcohol/week: 0.0 standard drinks    Comment: RARELY   Drug use: No   Sexual activity: Not Currently    Birth control/protection: Post-menopausal  Other Topics Concern   Not on file  Social History Narrative   Lives close to [place]; with husband. Quit smoking in 2007 [25 years]; ocassional alcohol. 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    Social Determinants of Health   Financial Resource Strain: Not on file  Food Insecurity: Not on file  Transportation Needs: Not on file  Physical Activity: Not on file  Stress: Not on file  Social Connections: Not on file  Intimate Partner Violence: Not on file    FAMILY HISTORY: Family History  Problem Relation Age of Onset   Bladder  Cancer Mother    Diabetes Father    Heart disease Father    Bladder Cancer Brother    Breast cancer Neg Hx     ALLERGIES:  is allergic to penicillins.  MEDICATIONS:  Current Outpatient Medications  Medication Sig Dispense Refill   anastrozole (ARIMIDEX) 1 MG tablet Take 1 tablet by mouth once daily 90 tablet 0   B COMPLEX VITAMINS PO Take 1 tablet by mouth daily.      Cholecalciferol (VITAMIN D) 125 MCG (5000 UT) CAPS Take 5,000 Units by mouth daily.      COLLAGEN PO Take 1 tablet by mouth daily. Super Collagen w/Biotin     ibuprofen (ADVIL) 200 MG tablet Take 600 mg by mouth every 8 (eight) hours as needed (for pain.).      lidocaine-prilocaine (EMLA) cream Apply 1 application topically as needed. 30 g 0   methimazole (TAPAZOLE) 5 MG tablet Take 5 mg by mouth daily.      Multiple Vitamin (MULTIVITAMIN WITH MINERALS) TABS tablet Take 1 tablet by mouth daily.     Omega 3-6-9 Fatty Acids (OMEGA 3-6-9 COMPLEX) CAPS Take 1 capsule by mouth daily.  valACYclovir (VALTREX) 500 MG tablet TAKE ONE TABLET BY MOUTH DAILY 30 tablet 11   chlorhexidine (PERIDEX) 0.12 % solution USE 15MLS IN THE MOUTH OR THROAT TWICE DAILY AS DIRECTED (Patient not taking: Reported on 02/23/2021) 473 mL 0   ondansetron (ZOFRAN) 8 MG tablet One pill every 8 hours as needed for nausea/vomitting. (Patient not taking: Reported on 03/26/2021) 40 tablet 1   prochlorperazine (COMPAZINE) 10 MG tablet Take 1 tablet (10 mg total) by mouth every 6 (six) hours as needed for nausea or vomiting. (Patient not taking: Reported on 03/26/2021) 40 tablet 1   No current facility-administered medications for this visit.   Facility-Administered Medications Ordered in Other Visits  Medication Dose Route Frequency Provider Last Rate Last Admin   heparin lock flush 100 unit/mL  500 Units Intracatheter Once PRN Cammie Sickle, MD       Zoledronic Acid (ZOMETA) IVPB 4 mg  4 mg Intravenous Once Cammie Sickle, MD        PHYSICAL  EXAMINATION: ECOG PERFORMANCE STATUS: 0 - Asymptomatic  Vitals:   03/26/21 1310  BP: 136/64  Pulse: 84  Resp: 16  Temp: 97.7 F (36.5 C)   Filed Weights   03/26/21 1310  Weight: 186 lb (84.4 kg)    Physical Exam HENT:     Head: Normocephalic and atraumatic.     Mouth/Throat:     Pharynx: No oropharyngeal exudate.  Eyes:     Pupils: Pupils are equal, round, and reactive to light.  Cardiovascular:     Rate and Rhythm: Normal rate and regular rhythm.  Pulmonary:     Effort: Pulmonary effort is normal. No respiratory distress.     Breath sounds: Normal breath sounds. No wheezing.  Abdominal:     General: Bowel sounds are normal. There is no distension.     Palpations: Abdomen is soft. There is no mass.     Tenderness: There is no abdominal tenderness. There is no guarding or rebound.  Musculoskeletal:        General: No tenderness. Normal range of motion.     Cervical back: Normal range of motion and neck supple.  Skin:    General: Skin is warm.  Neurological:     Mental Status: She is alert and oriented to person, place, and time.  Psychiatric:        Mood and Affect: Affect normal.   LABORATORY DATA:  I have reviewed the data as listed Lab Results  Component Value Date   WBC 6.3 03/26/2021   HGB 12.5 03/26/2021   HCT 38.4 03/26/2021   MCV 91.4 03/26/2021   PLT 197 03/26/2021   Recent Labs    09/03/20 0843 09/24/20 0859 03/26/21 1242  NA 140 140 138  K 3.9 4.0 3.8  CL 106 104 102  CO2 '24 24 24  ' GLUCOSE 117* 125* 129*  BUN '19 20 14  ' CREATININE 0.75 0.77 0.74  CALCIUM 9.1 9.5 9.4  GFRNONAA >60 >60 >60  PROT 6.9 6.9 7.1  ALBUMIN 4.0 4.2 4.2  AST 33 46* 60*  ALT 36 54* 80*  ALKPHOS 60 64 63  BILITOT 1.0 0.8 0.7    RADIOGRAPHIC STUDIES: I have personally reviewed the radiological images as listed and agreed with the findings in the report. No results found.  ASSESSMENT & PLAN:   Carcinoma of upper-outer quadrant of right breast in female,  estrogen receptor positive ([place]) #Right breast-invasive mammary carcinoma- STAGE I- TRIPLE POSITIVE; s/p  Herceptin maintenance [May 2022].  On Anastrazole. STABLE.   # OSTEOPENIA -BMD [ dec 2021-T-score of -1.2].- on adjuvant zometa q 6 M [jan 6th,0222]; STABLE.   # Right chest wall rash/Shingles-resolved; continue on Valtrex 500 mg once a day [sec prophy]-STABLE  # SWOG study/PN study- PN- G-1 monitor for now. STABLE  # Fatty liver- on [place]- AST/ALT [56/68] -grade 1 slightly elevated;exercising/yoga- STABLE.  # port-again reviewed/ discussed re: port ex-plantation; will keep for now for zometa.    # Disposition: # Zometa  # in 3 month- port flush # follow up in 6 months  MD; labs- cbc/cmp; Zometa;Dr.B  All questions were answered. The patient/family knows to call the clinic with any problems, questions or concerns.    Cammie Sickle, MD 03/26/2021 1:51 PM

## 2021-03-26 NOTE — Assessment & Plan Note (Addendum)
#  Right breast-invasive mammary carcinoma- STAGE I- TRIPLE POSITIVE; s/p  Herceptin maintenance [May 2022]. On Anastrazole. STABLE.   # OSTEOPENIA -BMD [ dec 2021-T-score of -1.2].- on adjuvant zometa q 6 M [jan 6th,0222]; STABLE.   # Right chest wall rash/Shingles-resolved; continue on Valtrex 500 mg once a day [sec prophy]-STABLE  # SWOG study/PN study- PN- G-1 monitor for now. STABLE  # Fatty liver- on [place]- AST/ALT [56/68] -grade 1 slightly elevated;exercising/yoga- STABLE.  # port-again reviewed/ discussed re: port ex-plantation; will keep for now for zometa.    # Disposition: # Zometa  # in 3 month- port flush # follow up in 6 months  MD; labs- cbc/cmp; Zometa;Dr.B

## 2021-03-26 NOTE — Patient Instructions (Signed)
[facility] [place]   °Discharge Instructions: °Thank you for choosing [facility] to provide your oncology and hematology care.  °If you have a lab appointment with the Cancer Center, please go directly to the Cancer Center and check in at the registration area. °  °We strive to give you quality time with your provider. You may need to reschedule your appointment if you arrive late (15 or more minutes).  Arriving late affects you and other patients whose appointments are after yours.  Also, if you miss three or more appointments without notifying the office, you may be dismissed from the clinic at the provider’s discretion.    °  °For prescription refill requests, have your pharmacy contact our office and allow 72 hours for refills to be completed.   ° °Today you received the following: Zometa.    °  °BELOW ARE SYMPTOMS THAT SHOULD BE REPORTED IMMEDIATELY: °*FEVER GREATER THAN 100.4 F (38 °C) OR HIGHER °*CHILLS OR SWEATING °*NAUSEA AND VOMITING THAT IS NOT CONTROLLED WITH YOUR NAUSEA MEDICATION °*UNUSUAL SHORTNESS OF BREATH °*UNUSUAL BRUISING OR BLEEDING °*URINARY PROBLEMS (pain or burning when urinating, or frequent urination) °*BOWEL PROBLEMS (unusual diarrhea, constipation, pain near the anus) °TENDERNESS IN MOUTH AND THROAT WITH OR WITHOUT PRESENCE OF ULCERS (sore throat, sores in mouth, or a toothache) °UNUSUAL RASH, SWELLING OR PAIN  °UNUSUAL VAGINAL DISCHARGE OR ITCHING  ° °Items with * indicate a potential emergency and should be followed up as soon as possible or go to the Emergency Department if any problems should occur. ° °Should you have questions after your visit or need to cancel or reschedule your appointment, please contact [facility] [place]  Dept: [phone]  and follow the prompts.  Office hours are 8:00 a.m. to 4:30 p.m. Monday - Friday. Please note that voicemails left after 4:00 p.m. may not be returned until the following  business day.  We are closed weekends and major holidays. You have access to a nurse at all times for urgent questions. Please call the main number to the clinic Dept: [phone] and follow the prompts. ° °For any non-urgent questions, you may also contact your provider using MyChart. We now offer e-Visits for anyone 18 and older to request care online for non-urgent symptoms. For details visit [email or web address]. °  °Also download the MyChart app! Go to the app store, search "MyChart", open the app, select [place], and log in with your MyChart username and password. ° °Due to Covid, a mask is required upon entering the hospital/clinic. If you do not have a mask, one will be given to you upon arrival. For doctor visits, patients may have 1 support person aged 18 or older with them. For treatment visits, patients cannot have anyone with them due to current Covid guidelines and our immunocompromised population.  °

## 2021-04-20 ENCOUNTER — Telehealth: Payer: Self-pay

## 2021-04-20 DIAGNOSIS — Z170 Estrogen receptor positive status [ER+]: Secondary | ICD-10-CM

## 2021-04-20 DIAGNOSIS — C50411 Malignant neoplasm of upper-outer quadrant of right female breast: Secondary | ICD-10-CM

## 2021-04-20 NOTE — Telephone Encounter (Signed)
Patient called back and stated she is not able to resume the CARE program anymore due to personal life at home. Will discharge at this time per patient request.

## 2021-04-20 NOTE — Progress Notes (Signed)
CARE Discharge Progress Report  Patient Details  Name: Connie OGBURN MRN: [id number] Date of Birth: 1960/09/17 Referring Provider:   [name] Exercise from 02/23/2021 in [facility]  Referring Provider [name] MD        Number of Visits: 7  Reason for Discharge:  Early Exit:  Personal  Smoking History:  Social History   Tobacco Use  Smoking Status Former  Smokeless Tobacco Never    Diagnosis:  Carcinoma of upper-outer quadrant of right breast in female, estrogen receptor positive ([place])  ADL UCSD:   Initial Exercise Prescription:  Initial Exercise Prescription - 02/23/21 1400       Date of Initial Exercise RX and Referring Provider   Date 02/23/21    Referring Provider [name] MD      Oxygen   Maintain Oxygen Saturation 88% or higher      Treadmill   MPH 2.8    Grade 1    Minutes 15    METs 3.53      Recumbant Bike   Level 2    RPM 60    Watts 30    Minutes 15    METs 4      NuStep   Level 3    SPM 80    Minutes 15    METs 4      REL-XR   Level 2    Speed 50    Minutes 15    METs 4      Prescription Details   Frequency (times per week) 2    Duration Progress to 30 minutes of continuous aerobic without signs/symptoms of physical distress      Intensity   THRR 40-80% of Max Heartrate 106-143    Ratings of Perceived Exertion 11-13    Perceived Dyspnea 0-4      Progression   Progression Continue to progress workloads to maintain intensity without signs/symptoms of physical distress.      Resistance Training   Training Prescription Yes    Weight 4 lb    Reps 10-15             Discharge Exercise Prescription (Final Exercise Prescription Changes):  Exercise Prescription Changes - 02/23/21 1400       Response to Exercise   Blood Pressure (Admit) 132/80    Blood Pressure (Exercise) 158/86    Blood Pressure (Exit) 140/78    Heart Rate (Admit) 70  bpm    Heart Rate (Exercise) 108 bpm    Heart Rate (Exit) 70 bpm    Oxygen Saturation (Admit) 98 %    Oxygen Saturation (Exercise) 95 %    Oxygen Saturation (Exit) 96 %    Rating of Perceived Exertion (Exercise) 10    Perceived Dyspnea (Exercise) 0    Symptoms none    Comments walk test results             Functional Capacity:  6 Minute Walk     Row Name 02/23/21 1404         6 Minute Walk   Phase Initial     Distance 1507 feet     Walk Time 6 minutes     # of Rest Breaks 0     MPH 2.85     METS 4.02     RPE 10     Perceived Dyspnea  0     VO2 Peak 14.08     Symptoms No  Resting HR 70 bpm     Resting BP 132/80     Resting Oxygen Saturation  98 %     Exercise Oxygen Saturation  during 6 min walk 95 %     Max Ex. HR 108 bpm     Max Ex. BP 156/86     2 Minute Post BP 140/78              Nutrition & Weight - Outcomes:  Pre Biometrics - 02/23/21 1411       Pre Biometrics   Height 5' 6.5" (1.689 m)    Weight 190 lb 6.4 oz (86.4 kg)    BMI (Calculated) 30.27    Single Leg Stand 15.2 seconds              Goals reviewed with patient; copy given to patient.

## 2021-04-20 NOTE — Telephone Encounter (Signed)
Called to speak with patient- has not been to CARE program last several weeks after vacation. Asked for call back.

## 2021-04-24 ENCOUNTER — Other Ambulatory Visit: Payer: Self-pay | Admitting: Internal Medicine

## 2021-05-05 ENCOUNTER — Encounter: Payer: Self-pay | Admitting: Family Medicine

## 2021-06-21 ENCOUNTER — Other Ambulatory Visit: Payer: Self-pay | Admitting: General Surgery

## 2021-06-21 DIAGNOSIS — R921 Mammographic calcification found on diagnostic imaging of breast: Secondary | ICD-10-CM

## 2021-06-21 DIAGNOSIS — Z1231 Encounter for screening mammogram for malignant neoplasm of breast: Secondary | ICD-10-CM

## 2021-06-22 ENCOUNTER — Other Ambulatory Visit: Payer: Self-pay

## 2021-06-22 MED ORDER — VALACYCLOVIR HCL 500 MG PO TABS: 500.0000 mg | ORAL_TABLET | Freq: Every day | ORAL | 5 refills | Status: DC

## 2021-06-22 NOTE — Progress Notes (Signed)
Pharmacy change for Valtrex per patient request. aw ?

## 2021-06-25 ENCOUNTER — Inpatient Hospital Stay: Payer: Managed Care, Other (non HMO) | Attending: Internal Medicine

## 2021-06-25 ENCOUNTER — Other Ambulatory Visit: Payer: Self-pay

## 2021-06-25 DIAGNOSIS — C50411 Malignant neoplasm of upper-outer quadrant of right female breast: Secondary | ICD-10-CM | POA: Diagnosis present

## 2021-06-25 DIAGNOSIS — Z170 Estrogen receptor positive status [ER+]: Secondary | ICD-10-CM | POA: Insufficient documentation

## 2021-06-25 DIAGNOSIS — Z452 Encounter for adjustment and management of vascular access device: Secondary | ICD-10-CM | POA: Diagnosis not present

## 2021-06-25 DIAGNOSIS — Z95828 Presence of other vascular implants and grafts: Secondary | ICD-10-CM

## 2021-06-25 MED ORDER — SODIUM CHLORIDE 0.9% FLUSH
10.0000 mL | Freq: Once | INTRAVENOUS | Status: AC
  Administered 2021-06-25: 10 mL via INTRAVENOUS
  Filled 2021-06-25: qty 10

## 2021-06-25 MED ORDER — HEPARIN SOD (PORK) LOCK FLUSH 100 UNIT/ML IV SOLN
500.0000 [IU] | Freq: Once | INTRAVENOUS | Status: AC
  Administered 2021-06-25: 500 [IU] via INTRAVENOUS
  Filled 2021-06-25: qty 5

## 2021-07-05 ENCOUNTER — Encounter: Payer: Self-pay | Admitting: Internal Medicine

## 2021-07-06 ENCOUNTER — Encounter: Payer: Self-pay | Admitting: Internal Medicine

## 2021-07-19 ENCOUNTER — Ambulatory Visit: Payer: Self-pay

## 2021-07-19 NOTE — Telephone Encounter (Signed)
? ? ?  Chief Complaint: Lips are "puffy", outside of neck is swollen, res spots inside mouth. ?Symptoms: Above ?Frequency: Today ?Pertinent Negatives: Patient denies any difficulty swallowing, no chest pain ?Disposition: '[]'$ ED /'[x]'$ Urgent Care (no appt availability in office) / '[]'$ Appointment(In office/virtual)/ '[]'$  [facility]/ '[]'$ Home Care/ '[]'$ Refused Recommended Disposition /'[]'$ [facility]/ '[]'$  Follow-up with PCP ?Additional Notes: Pt. Going to UC.  ?Reason for Disposition ? Patient sounds very sick or weak to the triager ? ?Answer Assessment - Initial Assessment Questions ?1. ONSET: "When did the swelling start?" (e.g., minutes, hours, days) ?    This morning ?2. SEVERITY: "How swollen is it?" ?    Mild ?3. ITCHING: "Is there any itching?" If Yes, ask: "How much?"   (Scale 1-10; mild, moderate or severe) ?    No ?4. PAIN: "Is the swelling painful to touch?" If Yes, ask: "How painful is it?"   (Scale 1-10; mild, moderate or severe) ?    No ?5. CAUSE: "What do you think is causing the lip swelling?" ?    Unsure ?6. RECURRENT SYMPTOM: "Have you had lip swelling before?" If Yes, ask: "When was the last time?" "What happened that time?" ?    No ?7. OTHER SYMPTOMS: "Do you have any other symptoms?" (e.g., toothache) ?    Spots in mouth ?8. PREGNANCY: "Is there any chance you are pregnant?" "When was your last menstrual period?" ?    No ? ?Protocols used: Lip Swelling-A-AH ? ?

## 2021-07-20 ENCOUNTER — Ambulatory Visit (INDEPENDENT_AMBULATORY_CARE_PROVIDER_SITE_OTHER): Payer: BC Managed Care – PPO | Admitting: Family Medicine

## 2021-07-20 ENCOUNTER — Encounter: Payer: Self-pay | Admitting: Family Medicine

## 2021-07-20 ENCOUNTER — Encounter: Payer: Self-pay | Admitting: Internal Medicine

## 2021-07-20 ENCOUNTER — Ambulatory Visit: Payer: 59 | Admitting: Family Medicine

## 2021-07-20 VITALS — BP 146/62 | HR 84 | Temp 97.8°F | Resp 16 | Wt 190.7 lb

## 2021-07-20 VITALS — BP 100/70 | Ht 66.0 in | Wt 191.0 lb

## 2021-07-20 DIAGNOSIS — R21 Rash and other nonspecific skin eruption: Secondary | ICD-10-CM | POA: Diagnosis not present

## 2021-07-20 DIAGNOSIS — K1379 Other lesions of oral mucosa: Secondary | ICD-10-CM | POA: Insufficient documentation

## 2021-07-20 DIAGNOSIS — K121 Other forms of stomatitis: Secondary | ICD-10-CM | POA: Diagnosis not present

## 2021-07-20 DIAGNOSIS — Z01419 Encounter for gynecological examination (general) (routine) without abnormal findings: Secondary | ICD-10-CM

## 2021-07-20 DIAGNOSIS — R635 Abnormal weight gain: Secondary | ICD-10-CM | POA: Diagnosis not present

## 2021-07-20 MED ORDER — PREDNISONE 10 MG (21) PO TBPK: ORAL_TABLET | ORAL | 0 refills | Status: DC

## 2021-07-20 MED ORDER — CLOBETASOL PROPIONATE 0.05 % EX CREA: 1.0000 "application " | TOPICAL_CREAM | Freq: Two times a day (BID) | CUTANEOUS | 1 refills | Status: AC

## 2021-07-20 MED ORDER — VALACYCLOVIR HCL 1 G PO TABS: 1000.0000 mg | ORAL_TABLET | Freq: Two times a day (BID) | ORAL | 0 refills | Status: AC

## 2021-07-20 MED ORDER — LIDOCAINE VISCOUS HCL 2 % MT SOLN: 5.0000 mL | Freq: Four times a day (QID) | OROMUCOSAL | 1 refills | Status: DC | PRN

## 2021-07-20 NOTE — Assessment & Plan Note (Signed)
Viral in nature ?Recommend use of topical steroid medication to assist ?Recommend 2 week f/u with derm for bx if symptoms do not improve ?Can RTC if symptoms do not improve and is unable to be seen by derm ?

## 2021-07-20 NOTE — Assessment & Plan Note (Signed)
Acute, due to ulcerations ?Will treat with mouthwash to assist  ?

## 2021-07-20 NOTE — Assessment & Plan Note (Signed)
Acute, worsening ?Assume flare of HSV with associated edema into lymph nodes  ?Recommend treatment dose of antiviral with magic mouthwash ?Use of prednisone taper to assist  ?RTC in 2 weeks if symptoms have not improved ?

## 2021-07-20 NOTE — Progress Notes (Signed)
?  ?[employer] as a Education administrator for Connie Sprout, FNP.,have documented all relevant documentation on the behalf of Connie Sprout, FNP,as directed by  Connie Sprout, FNP while in the presence of Connie Sprout, FNP.  ? ?Established patient visit ? ? ?Patient: Connie Bradford   DOB: [birth year]   61 y.o. Female  MRN: [id number] ?Visit Date: 07/20/2021 ? ?Today's healthcare provider: [name], FNP  ?Introduced to Designer, jewellery role and practice setting.  All questions answered.  Discussed provider/patient relationship and expectations. ? ?Patient seen in consult with Lavon Paganini, MD.  ? ?Subjective  ?  ?Sore Throat  ?This is a new problem. The current episode started in the past 7 days. The problem has been unchanged. There has been no fever. Associated symptoms include headaches, neck pain, shortness of breath and swollen glands. Pertinent negatives include no abdominal pain, congestion, coughing, diarrhea, drooling, ear discharge, ear pain, hoarse voice, plugged ear sensation, stridor, trouble swallowing or vomiting. She has had no exposure to strep or mono. She has tried nothing for the symptoms.   ? ?Denies new medications, supplements, soaps, detergents, cleaners. ? ?Medications: ?Outpatient Medications Prior to Visit  ?Medication Sig  ? anastrozole (ARIMIDEX) 1 MG tablet Take 1 tablet by mouth once daily  ? B COMPLEX VITAMINS PO Take 1 tablet by mouth daily.   ? Cholecalciferol (VITAMIN D) 125 MCG (5000 UT) CAPS Take 5,000 Units by mouth daily.   ? COLLAGEN PO Take 1 tablet by mouth daily. Super Collagen w/Biotin  ? ibuprofen (ADVIL) 200 MG tablet Take 600 mg by mouth every 8 (eight) hours as needed (for pain.).   ? lidocaine-prilocaine (EMLA) cream Apply 1 application topically as needed.  ? methimazole (TAPAZOLE) 5 MG tablet Take 5 mg by mouth daily.   ? Multiple Vitamin (MULTIVITAMIN WITH MINERALS) TABS tablet Take 1 tablet by mouth daily.  ? Omega 3-6-9 Fatty Acids (OMEGA 3-6-9  COMPLEX) CAPS Take 1 capsule by mouth daily.  ? ondansetron (ZOFRAN) 8 MG tablet One pill every 8 hours as needed for nausea/vomitting.  ? prochlorperazine (COMPAZINE) 10 MG tablet Take 1 tablet (10 mg total) by mouth every 6 (six) hours as needed for nausea or vomiting.  ? valACYclovir (VALTREX) 500 MG tablet Take 1 tablet (500 mg total) by mouth daily.  ? chlorhexidine (PERIDEX) 0.12 % solution USE 15MLS IN THE MOUTH OR THROAT TWICE DAILY AS DIRECTED (Patient not taking: Reported on 07/20/2021)  ? ?No facility-administered medications prior to visit.  ? ? ?Review of Systems  ?HENT:  Negative for congestion, drooling, ear discharge, ear pain, hoarse voice and trouble swallowing.   ?Respiratory:  Positive for shortness of breath. Negative for cough and stridor.   ?Gastrointestinal:  Negative for abdominal pain, diarrhea and vomiting.  ?Musculoskeletal:  Positive for neck pain.  ?Skin:  Positive for rash.  ?Neurological:  Positive for headaches.  ? ? ?  Objective  ?  ?BP (!) 146/62   Pulse 84   Temp 97.8 ?F (36.6 ?C) (Temporal)   Resp 16   Wt 190 lb 11.2 oz (86.5 kg)   LMP 12/18/2013   SpO2 99%   BMI 30.32 kg/m?  ? ? ?Physical Exam ?Vitals and nursing note reviewed.  ?Constitutional:   ?   General: She is not in acute distress. ?   Appearance: Normal appearance. She is well-developed and overweight. She is not ill-appearing, toxic-appearing or diaphoretic.  ?HENT:  ?   Head: Normocephalic and atraumatic.  ?  Right Ear: Tympanic membrane and ear canal normal.  ?   Left Ear: Tympanic membrane and ear canal normal.  ?   Mouth/Throat:  ?   Mouth: Mucous membranes are moist. Oral lesions present.  ?   Dentition: Gingival swelling present.  ?   Palate: Lesions present.  ?   Pharynx: Uvula midline. Pharyngeal swelling and posterior oropharyngeal erythema present. No oropharyngeal exudate or uvula swelling.  ?   Tonsils: 0 on the right. 0 on the left.  ? ?   Comments: Reports numbness in lips and swelling in  lips ?Eyes:  ?   Conjunctiva/sclera: Conjunctivae normal.  ?Neck:  ? ?Cardiovascular:  ?   Rate and Rhythm: Normal rate and regular rhythm.  ?   Pulses: Normal pulses.  ?   Heart sounds: Normal heart sounds. No murmur heard. ?  No friction rub. No gallop.  ?Pulmonary:  ?   Effort: Pulmonary effort is normal. No respiratory distress.  ?   Breath sounds: Normal breath sounds. No stridor. No wheezing, rhonchi or rales.  ?Chest:  ?   Chest wall: No tenderness.  ?Musculoskeletal:     ?   General: No swelling, tenderness, deformity or signs of injury. Normal range of motion.  ?   Cervical back: Normal range of motion.  ?   Right lower leg: No edema.  ?   Left lower leg: No edema.  ?Lymphadenopathy:  ?   Cervical: Cervical adenopathy present.  ?   Right cervical: Superficial cervical adenopathy present.  ?Skin: ?   General: Skin is warm and dry.  ?   Capillary Refill: Capillary refill takes less than 2 seconds.  ?   Coloration: Skin is not jaundiced or pale.  ?   Findings: Rash present. No bruising, erythema or lesion.  ?Neurological:  ?   General: No focal deficit present.  ?   Mental Status: She is alert and oriented to person, place, and time. Mental status is at baseline.  ?   Cranial Nerves: No cranial nerve deficit.  ?   Sensory: No sensory deficit.  ?   Motor: No weakness.  ?   Coordination: Coordination normal.  ?Psychiatric:     ?   Mood and Affect: Mood normal.     ?   Behavior: Behavior normal.     ?   Thought Content: Thought content normal.     ?   Judgment: Judgment normal.  ?  ? ? ? ? ? ? ? ?No results found for any visits on 07/20/21. ? Assessment & Plan  ?  ? ?Problem List Items Addressed This Visit   ? ?  ? Digestive  ? Oral ulcer - Primary  ?  Acute, worsening ?Assume flare of HSV with associated edema into lymph nodes  ?Recommend treatment dose of antiviral with magic mouthwash ?Use of prednisone taper to assist  ?RTC in 2 weeks if symptoms have not improved ?  ?  ? Relevant Medications  ? magic  mouthwash (lidocaine, diphenhydrAMINE, alum & mag hydroxide) suspension  ? valACYclovir (VALTREX) 1000 MG tablet  ?  ? Musculoskeletal and Integument  ? Skin rash  ?  Viral in nature ?Recommend use of topical steroid medication to assist ?Recommend 2 week f/u with derm for bx if symptoms do not improve ?Can RTC if symptoms do not improve and is unable to be seen by derm ?  ?  ? Relevant Medications  ? clobetasol cream (TEMOVATE) 0.05 %  ? predniSONE (STERAPRED  UNI-PAK 21 TAB) 10 MG (21) TBPK tablet  ?  ? Other  ? Mouth pain  ?  Acute, due to ulcerations ?Will treat with mouthwash to assist  ?  ?  ? Relevant Medications  ? magic mouthwash (lidocaine, diphenhydrAMINE, alum & mag hydroxide) suspension  ? valACYclovir (VALTREX) 1000 MG tablet  ? ? ? ?Return in about 2 weeks (around 08/03/2021), or if symptoms worsen or fail to improve.  ?   ? ?I, Connie Sprout, FNP, have reviewed all documentation for this visit. The documentation on 07/20/21 for the exam, diagnosis, procedures, and orders are all accurate and complete. ? ?Connie Sprout, FNP  ?[place] ?[phone] (phone) ?[phone] (fax) ? ?[facility] ?

## 2021-07-20 NOTE — Progress Notes (Signed)
?HPI: ?     Connie Bradford is a 61 y.o. G0P0000 who LMP was in the remote past, she presents today for her annual examination.   ? ?The patient has no complaints today. The patient is sexually active.  ? ?Her last pap: approximate date 2021 and was normal and last mammogram: approximate date 2021 and NIL, HPV neg ? ?Since pap she was dx with right breats cancer, s/p lumpectomy, chemo, and RT  and is on Arimidex and infusions monthly. She has monitoring mammograms through surgeon and has this next week. .  The patient does perform self breast exams.  There is notable family history of breast or ovarian cancer in her family. The patient is not taking hormone replacement therapy. Patient denies post-menopausal vaginal bleeding.   The patient has regular exercise: yes. The patient denies current symptoms of depression.   ? ?GYN Hx: ?Last Colonoscopy: 9 years ago  ago. Normal.  ? ?PMHx: ?Past Medical History:  ?Diagnosis Date  ? Atypical mole 09/26/2018  ? right mid anterior side/mild  ? Basal cell carcinoma 08/25/2014  ? right med cheek infraorbital  ? Breast cancer ([place]) 07/2019  ? [facility] and DCIS  ? Dyslipidemia   ? Family history of bladder cancer   ? Graves disease   ? Hyperthyroidism   ? Inborn lipid storage disorder   ? Leiomyoma of uterus   ? Personal history of chemotherapy 2021  ? RIGHT lumpectomy  ? Personal history of radiation therapy 2021  ? RIGHT lumpectomy  ? Vitamin D deficiency   ? ?Past Surgical History:  ?Procedure Laterality Date  ? APPENDECTOMY  1982  ? Cyst removed from ovaries   ? BREAST BIOPSY Right 07/05/2019  ? stereo bx, coil clip, IMC  ? BREAST BIOPSY Right 07/05/2019  ? stereo bx, ribbon clip, DCIS   ? BREAST LUMPECTOMY Right 07/31/2019  ?  [facility] and DCIS with SN bx  ? COLONOSCOPY WITH PROPOFOL N/A 01/20/2017  ? Procedure: COLONOSCOPY WITH PROPOFOL;  Surgeon: Jonathon Bellows, MD;  Location: [facility] ENDOSCOPY;  Service: Gastroenterology;  Laterality: N/A;  ? [place]  ?  NECK SURGERY  2013  ? LIft from weight loss  ? PARTIAL MASTECTOMY WITH NEEDLE LOCALIZATION AND AXILLARY SENTINEL LYMPH NODE BX Right 07/31/2019  ? Procedure: RIGHT PARTIAL MASTECTOMY WITH BRACKETED NEEDLE LOCALIZATION AND LEFT AXILLARY SENTINEL LYMPH NODE BIOPSY;  Surgeon: Herbert Pun, MD;  Location: [facility] ORS;  Service: General;  Laterality: Right;  ? PORTACATH PLACEMENT Left 07/31/2019  ? Procedure: INSERTION PORT-A-CATH LEFT INTERNAL JUGULAR;  Surgeon: Herbert Pun, MD;  Location: [facility] ORS;  Service: General;  Laterality: Left;  ? TONSILLECTOMY  1969  ? ?Family History  ?Problem Relation Age of Onset  ? Bladder Cancer Mother   ? Diabetes Father   ? Heart disease Father   ? Bladder Cancer Brother   ? Breast cancer Neg Hx   ? ?Social History  ? ?Tobacco Use  ? Smoking status: Former  ? Smokeless tobacco: Never  ?Vaping Use  ? Vaping Use: Never used  ?Substance Use Topics  ? Alcohol use: Yes  ?  Alcohol/week: 0.0 standard drinks  ?  Comment: RARELY  ? Drug use: No  ? ? ?Current Outpatient Medications:  ?  anastrozole (ARIMIDEX) 1 MG tablet, Take 1 tablet by mouth once daily, Disp: 90 tablet, Rfl: 0 ?  B COMPLEX VITAMINS PO, Take 1 tablet by mouth daily. , Disp: , Rfl:  ?  Cholecalciferol (VITAMIN  D) 125 MCG (5000 UT) CAPS, Take 5,000 Units by mouth daily. , Disp: , Rfl:  ?  clobetasol cream (TEMOVATE) 5.73 %, Apply 1 application. topically 2 (two) times daily., Disp: 60 g, Rfl: 1 ?  COLLAGEN PO, Take 1 tablet by mouth daily. Super Collagen w/Biotin, Disp: , Rfl:  ?  ibuprofen (ADVIL) 200 MG tablet, Take 600 mg by mouth every 8 (eight) hours as needed (for pain.). , Disp: , Rfl:  ?  lidocaine-prilocaine (EMLA) cream, Apply 1 application topically as needed., Disp: 30 g, Rfl: 0 ?  magic mouthwash (lidocaine, diphenhydrAMINE, alum & mag hydroxide) suspension, Swish and swallow 5 mLs 4 (four) times daily as needed for mouth pain., Disp: 360 mL, Rfl: 1 ?  methimazole (TAPAZOLE) 5 MG tablet, Take 5 mg by  mouth daily. , Disp: , Rfl:  ?  Multiple Vitamin (MULTIVITAMIN WITH MINERALS) TABS tablet, Take 1 tablet by mouth daily., Disp: , Rfl:  ?  Omega 3-6-9 Fatty Acids (OMEGA 3-6-9 COMPLEX) CAPS, Take 1 capsule by mouth daily., Disp: , Rfl:  ?  ondansetron (ZOFRAN) 8 MG tablet, One pill every 8 hours as needed for nausea/vomitting., Disp: 40 tablet, Rfl: 1 ?  predniSONE (STERAPRED UNI-PAK 21 TAB) 10 MG (21) TBPK tablet, Take with morning meal., Disp: 1 each, Rfl: 0 ?  prochlorperazine (COMPAZINE) 10 MG tablet, Take 1 tablet (10 mg total) by mouth every 6 (six) hours as needed for nausea or vomiting., Disp: 40 tablet, Rfl: 1 ?  valACYclovir (VALTREX) 1000 MG tablet, Take 1 tablet (1,000 mg total) by mouth 2 (two) times daily for 10 days., Disp: 20 tablet, Rfl: 0 ?  valACYclovir (VALTREX) 500 MG tablet, Take 1 tablet (500 mg total) by mouth daily., Disp: 30 tablet, Rfl: 5 ?  chlorhexidine (PERIDEX) 0.12 % solution, USE 15MLS IN THE MOUTH OR THROAT TWICE DAILY AS DIRECTED (Patient not taking: Reported on 07/20/2021), Disp: 473 mL, Rfl: 0 ?Allergies: Penicillins ? ?Review of Systems  ?Constitutional:  Negative for chills, fever and malaise/fatigue.  ?HENT:  Negative for congestion, sinus pain and sore throat.   ?Eyes:  Negative for blurred vision and pain.  ?Respiratory:  Negative for cough and wheezing.   ?Cardiovascular:  Negative for chest pain and leg swelling.  ?Gastrointestinal:  Negative for abdominal pain, constipation, diarrhea, heartburn, nausea and vomiting.  ?Genitourinary:  Negative for dysuria, frequency, hematuria and urgency.  ?Musculoskeletal:  Negative for back pain, joint pain, myalgias and neck pain.  ?Skin:  Negative for itching and rash.  ?Neurological:  Negative for dizziness, tremors and weakness.  ?Endo/Heme/Allergies:  Does not bruise/bleed easily.  ?Psychiatric/Behavioral:  Negative for depression. The patient is not nervous/anxious and does not have insomnia.   ? ?Objective: ?BP 100/70   Ht '5\' 6"'$   (1.676 m)   Wt 191 lb (86.6 kg)   LMP 12/18/2013   BMI 30.83 kg/m?   ?Filed Weights  ? 07/20/21 1317  ?Weight: 191 lb (86.6 kg)  ? Body mass index is 30.83 kg/m?Marland Kitchen ?Physical Exam ?Constitutional:   ?   General: She is not in acute distress. ?   Appearance: She is well-developed.  ?Genitourinary:  ?   Rectum normal.  ?   No lesions in the vagina.  ?   No vaginal bleeding.  ? ?   Right Adnexa: not tender and no mass present. ?   Left Adnexa: not tender and no mass present. ?   No cervical motion tenderness, friability, lesion or polyp.  ?  Uterus is not enlarged.  ?   No uterine mass detected. ?Breasts: ?   Right: No mass, skin change or tenderness.  ?   Left: No mass, skin change or tenderness.  ?HENT:  ?   Head: Normocephalic and atraumatic. No laceration.  ?   Right Ear: Hearing normal.  ?   Left Ear: Hearing normal.  ?   Mouth/Throat:  ?   Pharynx: Uvula midline.  ?Eyes:  ?   Pupils: Pupils are equal, round, and reactive to light.  ?Neck:  ?   Thyroid: No thyromegaly.  ?Cardiovascular:  ?   Rate and Rhythm: Normal rate and regular rhythm.  ?   Heart sounds: No murmur heard. ?  No friction rub. No gallop.  ?Pulmonary:  ?   Effort: Pulmonary effort is normal. No respiratory distress.  ?   Breath sounds: Normal breath sounds. No wheezing.  ?Abdominal:  ?   General: Bowel sounds are normal. There is no distension.  ?   Palpations: Abdomen is soft.  ?   Tenderness: There is no abdominal tenderness. There is no rebound.  ?Musculoskeletal:     ?   General: Normal range of motion.  ?   Cervical back: Normal range of motion and neck supple.  ?Neurological:  ?   Mental Status: She is alert and oriented to person, place, and time.  ?   Cranial Nerves: No cranial nerve deficit.  ?Skin: ?   General: Skin is warm and dry.  ?Psychiatric:     ?   Judgment: Judgment normal.  ?Vitals reviewed.  ? ? ?Assessment: Annual Exam ?1. Well woman exam with routine gynecological exam   ?2. Weight gain   ? ? ?Plan: ?1. Well woman exam with  routine gynecological exam ?- Pap UTD, recommended next in 2024  ?- Mammogram up to date ?- Recommended CRC-- last colonoscopy in 2018. PCP to follow up  ?- TSH ?- Lipid panel ?- Comprehensive metabolic pan

## 2021-07-21 ENCOUNTER — Encounter: Payer: Self-pay | Admitting: Internal Medicine

## 2021-07-21 LAB — CBC
Hematocrit: 40.6 % (ref 34.0–46.6)
Hemoglobin: 13.7 g/dL (ref 11.1–15.9)
MCH: 29.8 pg (ref 26.6–33.0)
MCHC: 33.7 g/dL (ref 31.5–35.7)
MCV: 89 fL (ref 79–97)
Platelets: 181 10*3/uL (ref 150–450)
RBC: 4.59 x10E6/uL (ref 3.77–5.28)
RDW: 13 % (ref 11.7–15.4)
WBC: 4.1 10*3/uL (ref 3.4–10.8)

## 2021-07-21 LAB — COMPREHENSIVE METABOLIC PANEL
ALT: 119 IU/L — ABNORMAL HIGH (ref 0–32)
AST: 85 IU/L — ABNORMAL HIGH (ref 0–40)
Albumin/Globulin Ratio: 1.8 (ref 1.2–2.2)
Albumin: 4.6 g/dL (ref 3.8–4.9)
Alkaline Phosphatase: 83 IU/L (ref 44–121)
BUN/Creatinine Ratio: 15 (ref 9–23)
BUN: 13 mg/dL (ref 6–24)
Bilirubin Total: 0.5 mg/dL (ref 0.0–1.2)
CO2: 24 mmol/L (ref 20–29)
Calcium: 10.5 mg/dL — ABNORMAL HIGH (ref 8.7–10.2)
Chloride: 103 mmol/L (ref 96–106)
Creatinine, Ser: 0.87 mg/dL (ref 0.57–1.00)
Globulin, Total: 2.6 g/dL (ref 1.5–4.5)
Glucose: 83 mg/dL (ref 70–99)
Potassium: 4 mmol/L (ref 3.5–5.2)
Sodium: 143 mmol/L (ref 134–144)
Total Protein: 7.2 g/dL (ref 6.0–8.5)
eGFR: 77 mL/min/{1.73_m2} (ref >59)

## 2021-07-21 LAB — HEMOGLOBIN A1C
Est. average glucose Bld gHb Est-mCnc: 143 mg/dL
Hgb A1c MFr Bld: 6.6 % — ABNORMAL HIGH (ref 4.8–5.6)

## 2021-07-21 LAB — LIPID PANEL
Chol/HDL Ratio: 3.7 ratio (ref 0.0–4.4)
Cholesterol, Total: 235 mg/dL — ABNORMAL HIGH (ref 100–199)
HDL: 63 mg/dL (ref >39)
LDL Chol Calc (NIH): 138 mg/dL — ABNORMAL HIGH (ref 0–99)
Triglycerides: 192 mg/dL — ABNORMAL HIGH (ref 0–149)
VLDL Cholesterol Cal: 34 mg/dL (ref 5–40)

## 2021-07-21 LAB — TSH: TSH: 1.82 u[IU]/mL (ref 0.450–4.500)

## 2021-07-22 ENCOUNTER — Ambulatory Visit
Admission: RE | Admit: 2021-07-22 | Discharge: 2021-07-22 | Disposition: A | Discharge status: Home / Self Care | Payer: BC Managed Care – PPO | Source: Ambulatory Visit | Attending: General Surgery | Admitting: General Surgery

## 2021-07-22 ENCOUNTER — Other Ambulatory Visit: Payer: Self-pay | Admitting: General Surgery

## 2021-07-22 DIAGNOSIS — R922 Inconclusive mammogram: Secondary | ICD-10-CM | POA: Diagnosis not present

## 2021-07-22 DIAGNOSIS — R590 Localized enlarged lymph nodes: Secondary | ICD-10-CM

## 2021-07-22 DIAGNOSIS — R921 Mammographic calcification found on diagnostic imaging of breast: Secondary | ICD-10-CM | POA: Diagnosis not present

## 2021-07-22 DIAGNOSIS — Z1231 Encounter for screening mammogram for malignant neoplasm of breast: Secondary | ICD-10-CM | POA: Diagnosis not present

## 2021-07-22 DIAGNOSIS — N6489 Other specified disorders of breast: Secondary | ICD-10-CM | POA: Diagnosis not present

## 2021-08-03 ENCOUNTER — Encounter: Payer: Self-pay | Admitting: Internal Medicine

## 2021-08-18 ENCOUNTER — Encounter: Payer: Self-pay | Admitting: Internal Medicine

## 2021-09-06 ENCOUNTER — Other Ambulatory Visit: Payer: Self-pay | Admitting: Internal Medicine

## 2021-09-09 ENCOUNTER — Other Ambulatory Visit: Payer: Self-pay | Admitting: General Surgery

## 2021-09-09 DIAGNOSIS — Z853 Personal history of malignant neoplasm of breast: Secondary | ICD-10-CM

## 2021-09-17 ENCOUNTER — Telehealth: Payer: Self-pay | Admitting: Medical Oncology

## 2021-09-17 NOTE — Telephone Encounter (Signed)
S1714 - A Prospective Observational Cohort Study to Develop a Predictive Model of Taxane-Induced Peripheral Neuropathy in Cancer Patients    Outgoing call: 104 Weeks Study follow-up assessments  Spoke with patient regarding time for neuropathy and questionnaire assessments. I introduced myself and inquired with patient if she felt comfortable completing study questionnaires over the phone with me, since her scheduled appointment with Dr. Burlene Arnt is two days after study required assessment to be completed. Patient gave me her verbal consent to complete the questions over the phone. This nurse read over the questionnaires and patient provided her response. All required study questionnaires have been completed for this time point.   Patient states that she does have some intermittent numbness and tingling to her fingers and toes. Patient confirms that she is having no pain and no difficulty with walking. Patient will be seen by Dr. Burlene Arnt next week, and at that visit, will have MD assess patient for neuropathy.   I thanked patient for her time today and for her continued contribution to the study. Patient denies any questions at this time. Patient encouraged to call with questions.  Patient was informed next study assessment will be at 156 weeks, in one year's time.   Maxwell Marion, RN, BSN, [place] Clinical Research Nurse Lead 09/17/2021 3:26 PM

## 2021-09-23 ENCOUNTER — Other Ambulatory Visit: Payer: Self-pay

## 2021-09-23 DIAGNOSIS — Z170 Estrogen receptor positive status [ER+]: Secondary | ICD-10-CM

## 2021-09-24 ENCOUNTER — Inpatient Hospital Stay: Payer: BC Managed Care – PPO | Attending: Internal Medicine

## 2021-09-24 ENCOUNTER — Inpatient Hospital Stay (HOSPITAL_BASED_OUTPATIENT_CLINIC_OR_DEPARTMENT_OTHER): Payer: BC Managed Care – PPO | Admitting: Internal Medicine

## 2021-09-24 ENCOUNTER — Encounter: Payer: Self-pay | Admitting: Internal Medicine

## 2021-09-24 ENCOUNTER — Inpatient Hospital Stay: Payer: BC Managed Care – PPO

## 2021-09-24 DIAGNOSIS — Z8249 Family history of ischemic heart disease and other diseases of the circulatory system: Secondary | ICD-10-CM | POA: Diagnosis not present

## 2021-09-24 DIAGNOSIS — Z9221 Personal history of antineoplastic chemotherapy: Secondary | ICD-10-CM | POA: Diagnosis not present

## 2021-09-24 DIAGNOSIS — C50411 Malignant neoplasm of upper-outer quadrant of right female breast: Secondary | ICD-10-CM | POA: Diagnosis not present

## 2021-09-24 DIAGNOSIS — Z87891 Personal history of nicotine dependence: Secondary | ICD-10-CM | POA: Insufficient documentation

## 2021-09-24 DIAGNOSIS — Z880 Allergy status to penicillin: Secondary | ICD-10-CM | POA: Insufficient documentation

## 2021-09-24 DIAGNOSIS — Z170 Estrogen receptor positive status [ER+]: Secondary | ICD-10-CM | POA: Insufficient documentation

## 2021-09-24 DIAGNOSIS — Z8052 Family history of malignant neoplasm of bladder: Secondary | ICD-10-CM | POA: Diagnosis not present

## 2021-09-24 DIAGNOSIS — E039 Hypothyroidism, unspecified: Secondary | ICD-10-CM | POA: Insufficient documentation

## 2021-09-24 DIAGNOSIS — Z833 Family history of diabetes mellitus: Secondary | ICD-10-CM | POA: Insufficient documentation

## 2021-09-24 DIAGNOSIS — R232 Flushing: Secondary | ICD-10-CM | POA: Insufficient documentation

## 2021-09-24 DIAGNOSIS — R5383 Other fatigue: Secondary | ICD-10-CM | POA: Insufficient documentation

## 2021-09-24 DIAGNOSIS — M85852 Other specified disorders of bone density and structure, left thigh: Secondary | ICD-10-CM

## 2021-09-24 DIAGNOSIS — K760 Fatty (change of) liver, not elsewhere classified: Secondary | ICD-10-CM | POA: Insufficient documentation

## 2021-09-24 DIAGNOSIS — M7989 Other specified soft tissue disorders: Secondary | ICD-10-CM | POA: Insufficient documentation

## 2021-09-24 DIAGNOSIS — Z90722 Acquired absence of ovaries, bilateral: Secondary | ICD-10-CM | POA: Insufficient documentation

## 2021-09-24 DIAGNOSIS — M8580 Other specified disorders of bone density and structure, unspecified site: Secondary | ICD-10-CM | POA: Diagnosis not present

## 2021-09-24 DIAGNOSIS — Z85828 Personal history of other malignant neoplasm of skin: Secondary | ICD-10-CM | POA: Insufficient documentation

## 2021-09-24 DIAGNOSIS — Z9049 Acquired absence of other specified parts of digestive tract: Secondary | ICD-10-CM | POA: Diagnosis not present

## 2021-09-24 DIAGNOSIS — Z79899 Other long term (current) drug therapy: Secondary | ICD-10-CM | POA: Insufficient documentation

## 2021-09-24 DIAGNOSIS — Z923 Personal history of irradiation: Secondary | ICD-10-CM | POA: Diagnosis not present

## 2021-09-24 LAB — CBC WITH DIFFERENTIAL/PLATELET
Abs Immature Granulocytes: 0.05 10*3/uL (ref 0.00–0.07)
Basophils Absolute: 0.1 10*3/uL (ref 0.0–0.1)
Basophils Relative: 2 %
Eosinophils Absolute: 0.2 10*3/uL (ref 0.0–0.5)
Eosinophils Relative: 3 %
HCT: 39.9 % (ref 36.0–46.0)
Hemoglobin: 12.8 g/dL (ref 12.0–15.0)
Immature Granulocytes: 1 %
Lymphocytes Relative: 30 %
Lymphs Abs: 1.8 10*3/uL (ref 0.7–4.0)
MCH: 29 pg (ref 26.0–34.0)
MCHC: 32.1 g/dL (ref 30.0–36.0)
MCV: 90.5 fL (ref 80.0–100.0)
Monocytes Absolute: 0.6 10*3/uL (ref 0.1–1.0)
Monocytes Relative: 9 %
Neutro Abs: 3.4 10*3/uL (ref 1.7–7.7)
Neutrophils Relative %: 55 %
Platelets: 208 10*3/uL (ref 150–400)
RBC: 4.41 MIL/uL (ref 3.87–5.11)
RDW: 13.2 % (ref 11.5–15.5)
WBC: 6.2 10*3/uL (ref 4.0–10.5)
nRBC: 0 % (ref 0.0–0.2)

## 2021-09-24 LAB — COMPREHENSIVE METABOLIC PANEL
ALT: 106 U/L — ABNORMAL HIGH (ref 0–44)
AST: 72 U/L — ABNORMAL HIGH (ref 15–41)
Albumin: 4.1 g/dL (ref 3.5–5.0)
Alkaline Phosphatase: 71 U/L (ref 38–126)
Anion gap: 8 (ref 5–15)
BUN: 12 mg/dL (ref 6–20)
CO2: 26 mmol/L (ref 22–32)
Calcium: 9.8 mg/dL (ref 8.9–10.3)
Chloride: 105 mmol/L (ref 98–111)
Creatinine, Ser: 0.69 mg/dL (ref 0.44–1.00)
GFR, Estimated: >60 mL/min (ref >60)
Glucose, Bld: 159 mg/dL — ABNORMAL HIGH (ref 70–99)
Potassium: 4 mmol/L (ref 3.5–5.1)
Sodium: 139 mmol/L (ref 135–145)
Total Bilirubin: 0.5 mg/dL (ref 0.3–1.2)
Total Protein: 7.1 g/dL (ref 6.5–8.1)

## 2021-09-24 MED ORDER — HEPARIN SOD (PORK) LOCK FLUSH 100 UNIT/ML IV SOLN
500.0000 [IU] | Freq: Once | INTRAVENOUS | Status: AC | PRN
  Administered 2021-09-24: 500 [IU]
  Filled 2021-09-24: qty 5

## 2021-09-24 MED ORDER — ZOLEDRONIC ACID 4 MG/100ML IV SOLN
4.0000 mg | Freq: Once | INTRAVENOUS | Status: AC
  Administered 2021-09-24: 4 mg via INTRAVENOUS
  Filled 2021-09-24: qty 100

## 2021-09-24 MED ORDER — SODIUM CHLORIDE 0.9 % IV SOLN
Freq: Once | INTRAVENOUS | Status: AC
  Filled 2021-09-24: qty 250

## 2021-09-24 NOTE — Progress Notes (Signed)
[facility] [place] NOTE  Patient Care Team: Birdie Sons, MD as PCP - General (Family Medicine) [facility] Gyn (Gynecology) Theodore Demark, RN (Inactive) as Oncology Nurse Navigator Cammie Sickle, MD as Consulting Physician (Hematology and Oncology) Cammie Sickle, MD as Consulting Physician (Internal Medicine) Noreene Filbert, MD as Consulting Physician (Radiation Oncology) Herbert Pun, MD as Consulting Physician (General Surgery)  CHIEF COMPLAINTS/PURPOSE OF CONSULTATION: Breast cancer  #  Oncology History Overview Note  # MARCH 2021- RUOQ- 79m- [BREAST, RIGHT, LATERAL POSTERIOR  - INVASIVE MAMMARY CARCINOMA, NO SPECIAL TYPE, ASSOCIATED WITH  CALCIFICATIONS.][place] ER-> 90%; PR- 50-90%; HER 2 FISH- POSITIVE  # S/P LUMPECTOMY [Dr.Cintron] pT1a [463m;pN0 [STAGE IA- ER/PRpoS; Her 2 POS]  . BREAST, RIGHT, LATERAL MIDDLE DEPTH (RIBBON-SHAPED CLIP):  STEREOTACTIC-GUIDED CORE BIOPSY: - DUCTAL CARCINOMA IN SITU (DCIS), HIGH-GRADE, ASSOCIATED WITH  CALCIFICATIONS.   # May 14th,2021- TH-H; s/p RT [finished OCT 2021]; NOV 23rd 2021- start Anastrazole; ; s/p  Herceptin maintenance [May 2022].  #Hypothyroidism-methimazole [Dr.Solum]  # SURVIVORSHIP:   # GENETICS:   DIAGNOSIS: Breast cancer  STAGE:   1      ;  GOALS: Cure  CURRENT/MOST RECENT THERAPY : Taxol Herceptin   Carcinoma of upper-outer quadrant of right breast in female, estrogen receptor positive ([facility] [place] 07/[place] Initial Diagnosis   Carcinoma of upper-outer quadrant of right breast in female, estrogen receptor positive ([place]  08/28/2019 -  Chemotherapy    Patient is on Treatment Plan: BREAST WEEKLY PACLITAXEL / TRASTUZUMAB / MAINTENANCE TRASTUZUMAB EVERY 21 DAYS        HISTORY OF PRESENTING ILLNESS: Alone.  Ambulating independently. Connie Burdinemall [age].o.  female with breast cancer early stage-ER/PR positive HER-2/neu positive on adjuvant anastrozole/adjuvant zometa is here for  follow-up.  Patient had a mammogram in April that showed mild axilla adenopathy.  Recommended repeat ultrasound in 3 months.  On the same time patient had a full body rash- ?  Reaction to detergent.  Patient denies any joint pains. denies any significant hot flashes.  Denies any chest pain or shortness of breath or cough.  No nausea no vomiting.  Patient admits to compliance with her anastrozole.  Review of Systems  Constitutional:  Positive for malaise/fatigue. Negative for chills, diaphoresis, fever and weight loss.  HENT:  Negative for nosebleeds and sore throat.   Eyes:  Negative for double vision.  Respiratory:  Negative for cough, hemoptysis, sputum production, shortness of breath and wheezing.   Cardiovascular:  Positive for leg swelling. Negative for chest pain, palpitations and orthopnea.  Gastrointestinal:  Negative for abdominal pain, blood in stool, constipation, diarrhea, heartburn, melena, nausea and vomiting.  Genitourinary:  Negative for dysuria, frequency and urgency.  Musculoskeletal:  Negative for back pain and joint pain.  Skin:  Negative for itching.  Neurological:  Negative for dizziness, focal weakness, weakness and headaches.  Endo/Heme/Allergies:  Does not bruise/bleed easily.  Psychiatric/Behavioral:  Negative for depression. The patient is not nervous/anxious and does not have insomnia.      MEDICAL HISTORY:  Past Medical History:  Diagnosis Date   Atypical mole 09/26/2018   right mid anterior side/mild   Basal cell carcinoma 08/25/2014   right med cheek infraorbital   [place] DC[place]   Dyslipidemia    Family history of bladder cancer    Graves disease    Hyperthyroidism    Inborn lipid storage disorder    Leiomyoma of uterus  Personal history of chemotherapy 2021   RIGHT lumpectomy   Personal history of radiation therapy 2021   RIGHT lumpectomy   Vitamin D deficiency     SURGICAL HISTORY: Past Surgical History:   Procedure Laterality Date   APPENDECTOMY  1982   Cyst removed from ovaries    BREAST BIOPSY Right 07/05/2019   stereo bx, coil clip, [facility]   BREAST BIOPSY Right 07/05/2019   stereo bx, ribbon clip, DCIS    BREAST LUMPECTOMY Right 07/31/2019    [facility] and DCIS with SN bx   COLONOSCOPY WITH PROPOFOL N/A 01/20/2017   Procedure: COLONOSCOPY WITH PROPOFOL;  Surgeon: Jonathon Bellows, MD;  Location: [facility] ENDOSCOPY;  Service: Gastroenterology;  Laterality: N/A;   [place]   NECK SURGERY  2013   LIft from weight loss   PARTIAL MASTECTOMY WITH NEEDLE LOCALIZATION AND AXILLARY SENTINEL LYMPH NODE BX Right 07/31/2019   Procedure: RIGHT PARTIAL MASTECTOMY WITH BRACKETED NEEDLE LOCALIZATION AND LEFT AXILLARY SENTINEL LYMPH NODE BIOPSY;  Surgeon: Herbert Pun, MD;  Location: [facility] ORS;  Service: General;  Laterality: Right;   PORTACATH PLACEMENT Left 07/31/2019   Procedure: INSERTION PORT-A-CATH LEFT INTERNAL JUGULAR;  Surgeon: Herbert Pun, MD;  Location: [facility] ORS;  Service: General;  Laterality: Left;   TONSILLECTOMY  1969    SOCIAL HISTORY: Social History   Socioeconomic History   Marital status: Married    Spouse name: Not on file   Number of children: 0   Years of education: Coll Grad   Highest education level: Not on file  Occupational History   Occupation: Full-Time    Comment: [employer] x9  Tobacco Use   Smoking status: Former   Smokeless tobacco: Never  Scientific laboratory technician Use: Never used  Substance and Sexual Activity   Alcohol use: Yes    Alcohol/week: 0.0 standard drinks of alcohol    Comment: RARELY   Drug use: No   Sexual activity: Not Currently    Birth control/protection: Post-menopausal  Other Topics Concern   Not on file  Social History Narrative   Lives close to [place]; with husband. Quit smoking in 2007 [25 years]; ocassional alcohol. 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    Social Determinants of Health   Financial  Resource Strain: Not on file  Food Insecurity: Not on file  Transportation Needs: Not on file  Physical Activity: Sufficiently Active (05/26/2017)   Exercise Vital Sign    Days of Exercise per Week: 5 days    Minutes of Exercise per Session: 60 min  Stress: No Stress Concern Present (05/26/2017)   [place]    Feeling of Stress : Not at all  Social Connections: Moderately Integrated (05/26/2017)   Social Connection and Isolation Panel [NHANES]    Frequency of Communication with Friends and Family: More than three times a week    Frequency of Social Gatherings with Friends and Family: More than three times a week    Attends Religious Services: More than 4 times per year    Active Member of [employer] or Organizations: No    Attends Archivist Meetings: Never    Marital Status: Married  Human resources officer Violence: Not At Risk (05/26/2017)   Humiliation, Afraid, Rape, and Kick questionnaire    Fear of Current or Ex-Partner: No    Emotionally Abused: No    Physically Abused: No    Sexually Abused: No    FAMILY HISTORY: Family History  Problem Relation Age of Onset   Bladder Cancer Mother    Diabetes Father    Heart disease Father    Bladder Cancer Brother    Breast cancer Neg Hx     ALLERGIES:  is allergic to penicillins.  MEDICATIONS:  Current Outpatient Medications  Medication Sig Dispense Refill   anastrozole (ARIMIDEX) 1 MG tablet Take 1 tablet by mouth once daily 90 tablet 0   B COMPLEX VITAMINS PO Take 1 tablet by mouth daily.      Cholecalciferol (VITAMIN D) 125 MCG (5000 UT) CAPS Take 5,000 Units by mouth daily.      clobetasol cream (TEMOVATE) 4.09 % Apply 1 application. topically 2 (two) times daily. 60 g 1   COLLAGEN PO Take 1 tablet by mouth daily. Super Collagen w/Biotin     ibuprofen (ADVIL) 200 MG tablet Take 600 mg by mouth every 8 (eight) hours as needed (for pain.).      lidocaine-prilocaine  (EMLA) cream Apply 1 application topically as needed. 30 g 0   magic mouthwash (lidocaine, diphenhydrAMINE, alum & mag hydroxide) suspension Swish and swallow 5 mLs 4 (four) times daily as needed for mouth pain. 360 mL 1   methimazole (TAPAZOLE) 5 MG tablet Take 5 mg by mouth daily.      Multiple Vitamin (MULTIVITAMIN WITH MINERALS) TABS tablet Take 1 tablet by mouth daily.     Omega 3-6-9 Fatty Acids (OMEGA 3-6-9 COMPLEX) CAPS Take 1 capsule by mouth daily.     ondansetron (ZOFRAN) 8 MG tablet One pill every 8 hours as needed for nausea/vomitting. 40 tablet 1   predniSONE (STERAPRED UNI-PAK 21 TAB) 10 MG (21) TBPK tablet Take with morning meal. 1 each 0   prochlorperazine (COMPAZINE) 10 MG tablet Take 1 tablet (10 mg total) by mouth every 6 (six) hours as needed for nausea or vomiting. 40 tablet 1   valACYclovir (VALTREX) 500 MG tablet Take 1 tablet (500 mg total) by mouth daily. 30 tablet 5   chlorhexidine (PERIDEX) 0.12 % solution USE 15MLS IN THE MOUTH OR THROAT TWICE DAILY AS DIRECTED (Patient not taking: Reported on 07/20/2021) 473 mL 0   No current facility-administered medications for this visit.    PHYSICAL EXAMINATION: ECOG PERFORMANCE STATUS: 0 - Asymptomatic  Vitals:   09/24/21 1259  BP: 126/74  Pulse: 64  Temp: 98.4 F (36.9 C)  SpO2: 100%   Filed Weights   09/24/21 1259  Weight: 191 lb 6.4 oz (86.8 kg)    Physical Exam HENT:     Head: Normocephalic and atraumatic.     Mouth/Throat:     Pharynx: No oropharyngeal exudate.  Eyes:     Pupils: Pupils are equal, round, and reactive to light.  Cardiovascular:     Rate and Rhythm: Normal rate and regular rhythm.  Pulmonary:     Effort: Pulmonary effort is normal. No respiratory distress.     Breath sounds: Normal breath sounds. No wheezing.  Abdominal:     General: Bowel sounds are normal. There is no distension.     Palpations: Abdomen is soft. There is no mass.     Tenderness: There is no abdominal tenderness. There  is no guarding or rebound.  Musculoskeletal:        General: No tenderness. Normal range of motion.     Cervical back: Normal range of motion and neck supple.  Skin:    General: Skin is warm.  Neurological:     Mental Status: She is alert and  oriented to person, place, and time.  Psychiatric:        Mood and Affect: Affect normal.    LABORATORY DATA:  I have reviewed the data as listed Lab Results  Component Value Date   WBC 6.2 09/24/2021   HGB 12.8 09/24/2021   HCT 39.9 09/24/2021   MCV 90.5 09/24/2021   PLT 208 09/24/2021   Recent Labs    03/26/21 1242 07/20/21 1350 09/24/21 1243  NA 138 143 139  K 3.8 4.0 4.0  CL 102 103 105  CO2 '24 24 26  ' GLUCOSE 129* 83 159*  BUN '14 13 12  ' CREATININE 0.74 0.87 0.69  CALCIUM 9.4 10.5* 9.8  GFRNONAA >60  --  >60  PROT 7.1 7.2 7.1  ALBUMIN 4.2 4.6 4.1  AST 60* 85* 72*  ALT 80* 119* 106*  ALKPHOS 63 83 71  BILITOT 0.7 0.5 0.5    RADIOGRAPHIC STUDIES: I have personally reviewed the radiological images as listed and agreed with the findings in the report. No results found.  ASSESSMENT & PLAN:   Carcinoma of upper-outer quadrant of right breast in female, estrogen receptor positive ([place]) #Right breast-invasive mammary carcinoma-s/p lumpectomy- STAGE I- TRIPLE POSITIVE; s/p  Herceptin maintenance [May 2022]. On Anastrazole.  Stable. APRIL 2023 [Dr.Cintron] BIL Mamm-Right calcifications attributed to treatment.  #April 2023 incidental left axillary adenopathy- left mammo [? Allergic reaction? Detergent] awaiting repeat breast/axillary ultrasound in 3 months.  # Hot flashes- G-1- sec to anastrazole.  Stable  # OSTEOPENIA -BMD [ dec 2021-T-score of -1.2].- on ADJUVANT zometa q 6 M [jan 6th,022 x3 years STABLE.  We will decide on Zometa at that time.  # SWOG study/PN study- PN- G-0-1 monitor for now. STABLE  # Fatty liver- on [place]- AST/ALT  -grade 1 slightly elevated;exercising/yoga- STABLE.  # port-again reviewed/ discussed re:  port ex-plantation; will keep for now for zometa.    # Disposition: # Zometa  # in 3 month- port flush # follow up in  [place] mid 2023-  MD;port ;labs- cbc/cmp; Zometa; BMD-;Dr.B  All questions were answered. The patient/family knows to call the clinic with any problems, questions or concerns.    Cammie Sickle, MD 09/26/2021 10:25 PM

## 2021-09-24 NOTE — Patient Instructions (Signed)
[facility] CANCER CTR AT [place]  Discharge Instructions: Thank you for choosing [facility] to provide your oncology and hematology care.  If you have a lab appointment with the [facility], please go directly to the [facility] and check in at the registration area.  Wear comfortable clothing and clothing appropriate for easy access to any Portacath or PICC line.   We strive to give you quality time with your provider. You may need to reschedule your appointment if you arrive late (15 or more minutes).  Arriving late affects you and other patients whose appointments are after yours.  Also, if you miss three or more appointments without notifying the office, you may be dismissed from the clinic at the provider's discretion.      For prescription refill requests, have your pharmacy contact our office and allow 72 hours for refills to be completed.    Today you received the following chemotherapy and/or immunotherapy agents Zometa.      To help prevent nausea and vomiting after your treatment, we encourage you to take your nausea medication as directed.  BELOW ARE SYMPTOMS THAT SHOULD BE REPORTED IMMEDIATELY: *FEVER GREATER THAN 100.4 F (38 C) OR HIGHER *CHILLS OR SWEATING *NAUSEA AND VOMITING THAT IS NOT CONTROLLED WITH YOUR NAUSEA MEDICATION *UNUSUAL SHORTNESS OF BREATH *UNUSUAL BRUISING OR BLEEDING *URINARY PROBLEMS (pain or burning when urinating, or frequent urination) *BOWEL PROBLEMS (unusual diarrhea, constipation, pain near the anus) TENDERNESS IN MOUTH AND THROAT WITH OR WITHOUT PRESENCE OF ULCERS (sore throat, sores in mouth, or a toothache) UNUSUAL RASH, SWELLING OR PAIN  UNUSUAL VAGINAL DISCHARGE OR ITCHING   Items with * indicate a potential emergency and should be followed up as soon as possible or go to the Emergency Department if any problems should occur.  Please show the CHEMOTHERAPY ALERT CARD or IMMUNOTHERAPY ALERT CARD at check-in to the  Emergency Department and triage nurse.  Should you have questions after your visit or need to cancel or reschedule your appointment, please contact [facility] CANCER [place] AT [place]  [phone] and follow the prompts.  Office hours are 8:00 a.m. to 4:30 p.m. Monday - Friday. Please note that voicemails left after 4:00 p.m. may not be returned until the following business day.  We are closed weekends and major holidays. You have access to a nurse at all times for urgent questions. Please call the main number to the clinic [phone] and follow the prompts.  For any non-urgent questions, you may also contact your provider using MyChart. We now offer e-Visits for anyone 65 and older to request care online for non-urgent symptoms. For details visit [email or web address].   Also download the MyChart app! Go to the app store, search "MyChart", open the app, select [facility], and log in with your MyChart username and password.  Due to Covid, a mask is required upon entering the hospital/clinic. If you do not have a mask, one will be given to you upon arrival. For doctor visits, patients may have 1 support person aged 42 or older with them. For treatment visits, patients cannot have anyone with them due to current Covid guidelines and our immunocompromised population.

## 2021-09-24 NOTE — Assessment & Plan Note (Addendum)
#  Right breast-invasive mammary carcinoma-s/p lumpectomy- STAGE I- TRIPLE POSITIVE; s/p  Herceptin maintenance [May 2022]. On Anastrazole. STABLE. APRIL 2023 [Dr.Cintron] BIL Mamm-Right calf=cifications- sec to tratment   # indeicental Left mammo [? Allergic reaction? detergent]APRIL 2023- Left ax LN- LEFT breast/axillary ultrasound in 3 months.  # Hot flashes- G-1- sec to anastrazole.   # OSTEOPENIA -BMD [ dec 2021-T-score of -1.2].- on adjuvant zometa q 6 M [jan 6th,022 x3 yeea STABLE.   # Right chest wall rash/Shingles-resolved; continue on Valtrex 500 mg once a day [sec prophy]- STABLE  # SWOG study/PN study- PN- G-0-1 monitor for now. STABLE  # Fatty liver- on [place]- AST/ALT  -grade 1 slightly elevated;exercising/yoga- STABLE.  # port-again reviewed/ discussed re: port ex-plantation; will keep for now for zometa.    # Disposition: # Zometa  # in 3 month- port flush # follow up in  [place] mid 2023-  MD;port ;labs- cbc/cmp; Zometa; BMD-;Dr.B

## 2021-09-26 ENCOUNTER — Encounter: Payer: Self-pay | Admitting: Internal Medicine

## 2021-10-13 ENCOUNTER — Encounter: Payer: Managed Care, Other (non HMO) | Admitting: Dermatology

## 2021-11-04 ENCOUNTER — Other Ambulatory Visit: Payer: BC Managed Care – PPO

## 2021-11-08 ENCOUNTER — Other Ambulatory Visit: Payer: Self-pay

## 2021-11-22 IMAGING — MG DIGITAL DIAGNOSTIC UNILAT RIGHT W/ CAD
4 series · 4 of 4 positions shown · non-contrast
Comparison: Previous exam(s).

CLINICAL DATA: Screening recall for right breast calcifications.

EXAM:
DIGITAL DIAGNOSTIC UNILATERAL RIGHT MAMMOGRAM WITH CAD AND TOMO
RIGHT BREAST ULTRASOUND

[R CC]
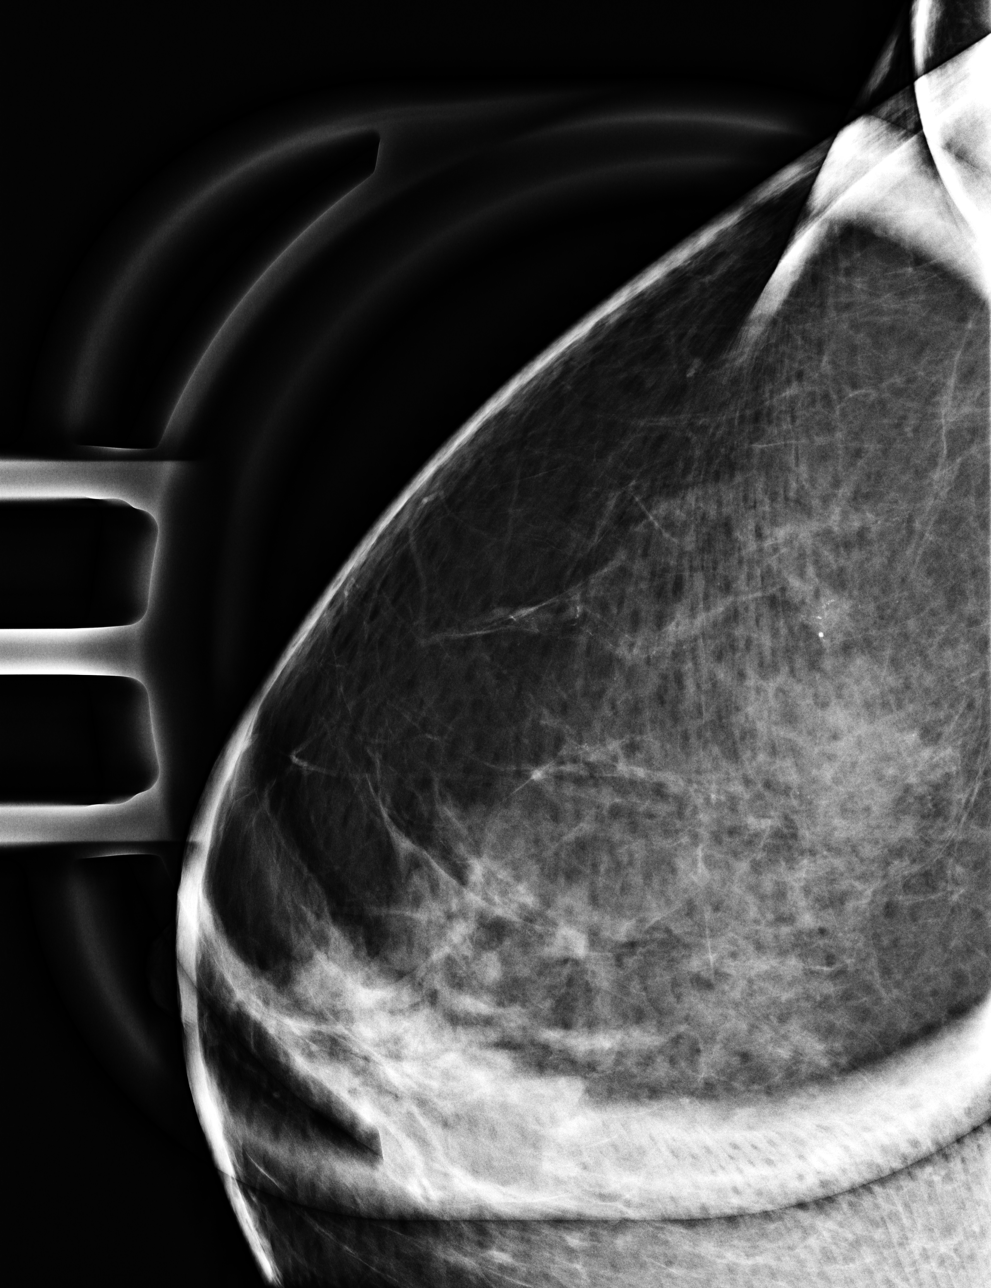

[R ML (1 of 3)]
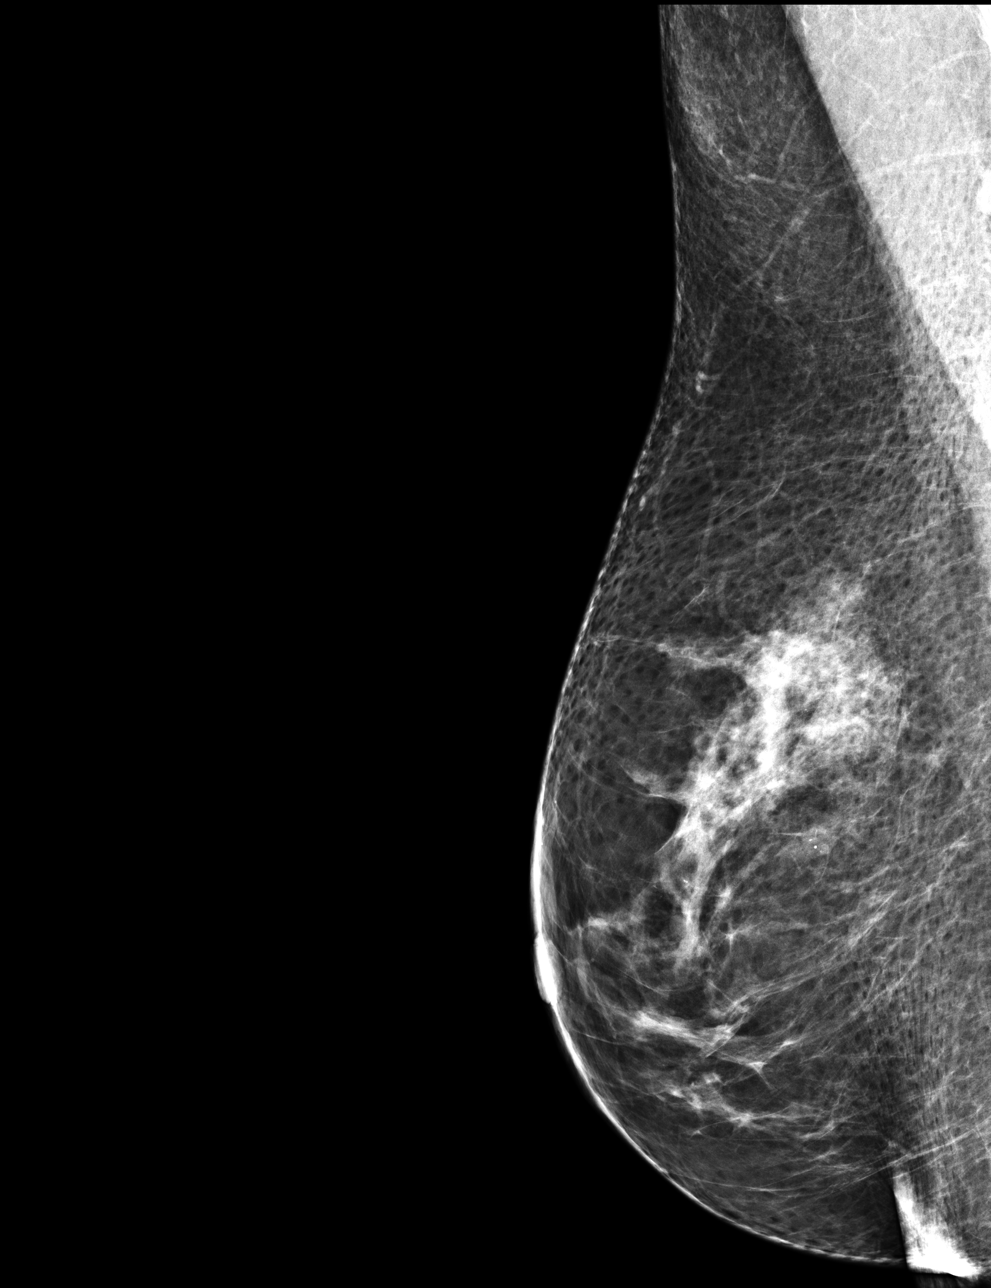

[R ML (2 of 3)]
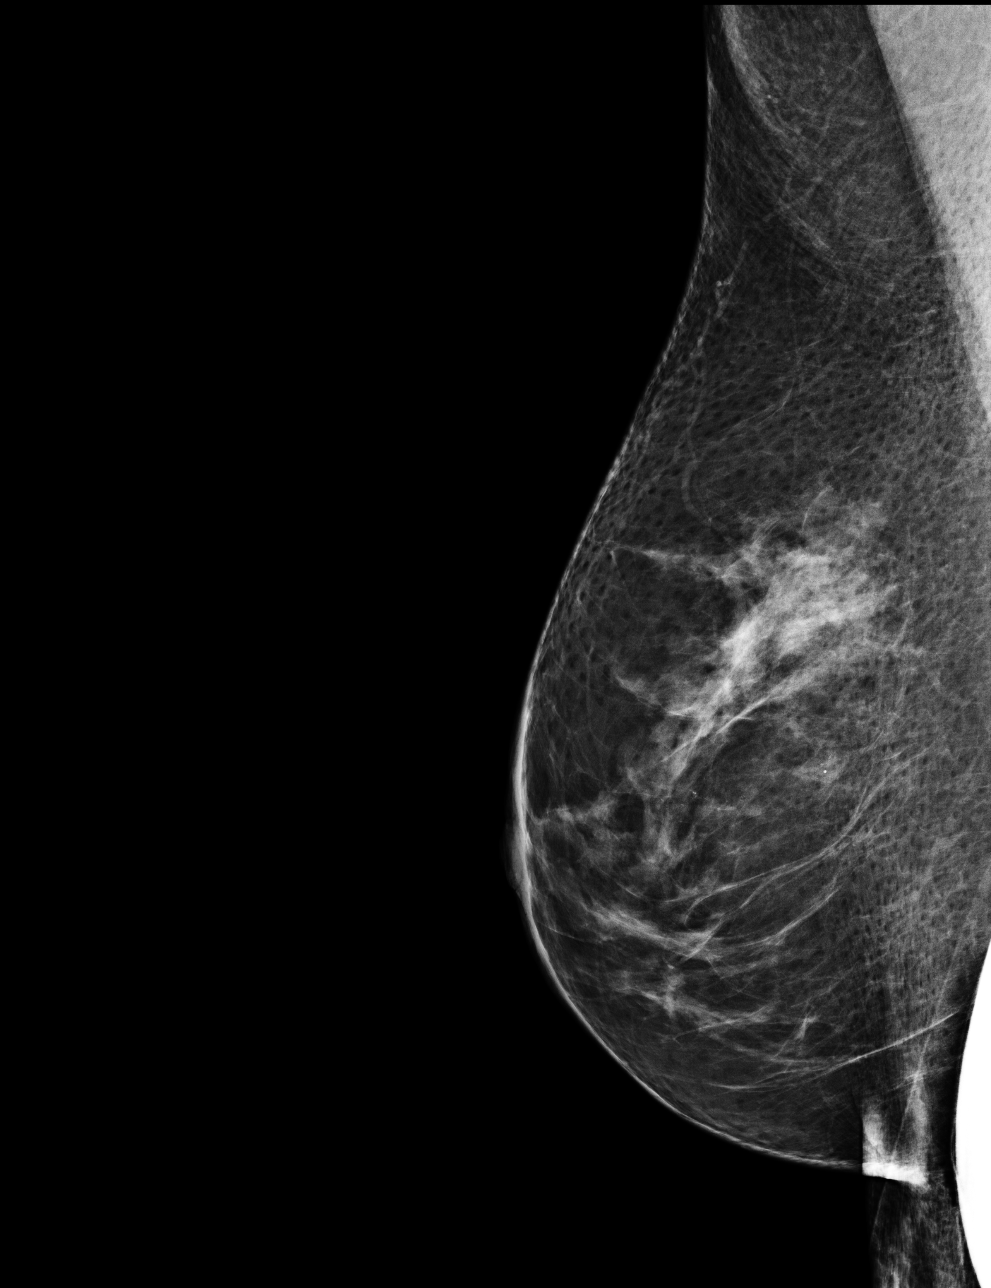

[R ML (3 of 3)]
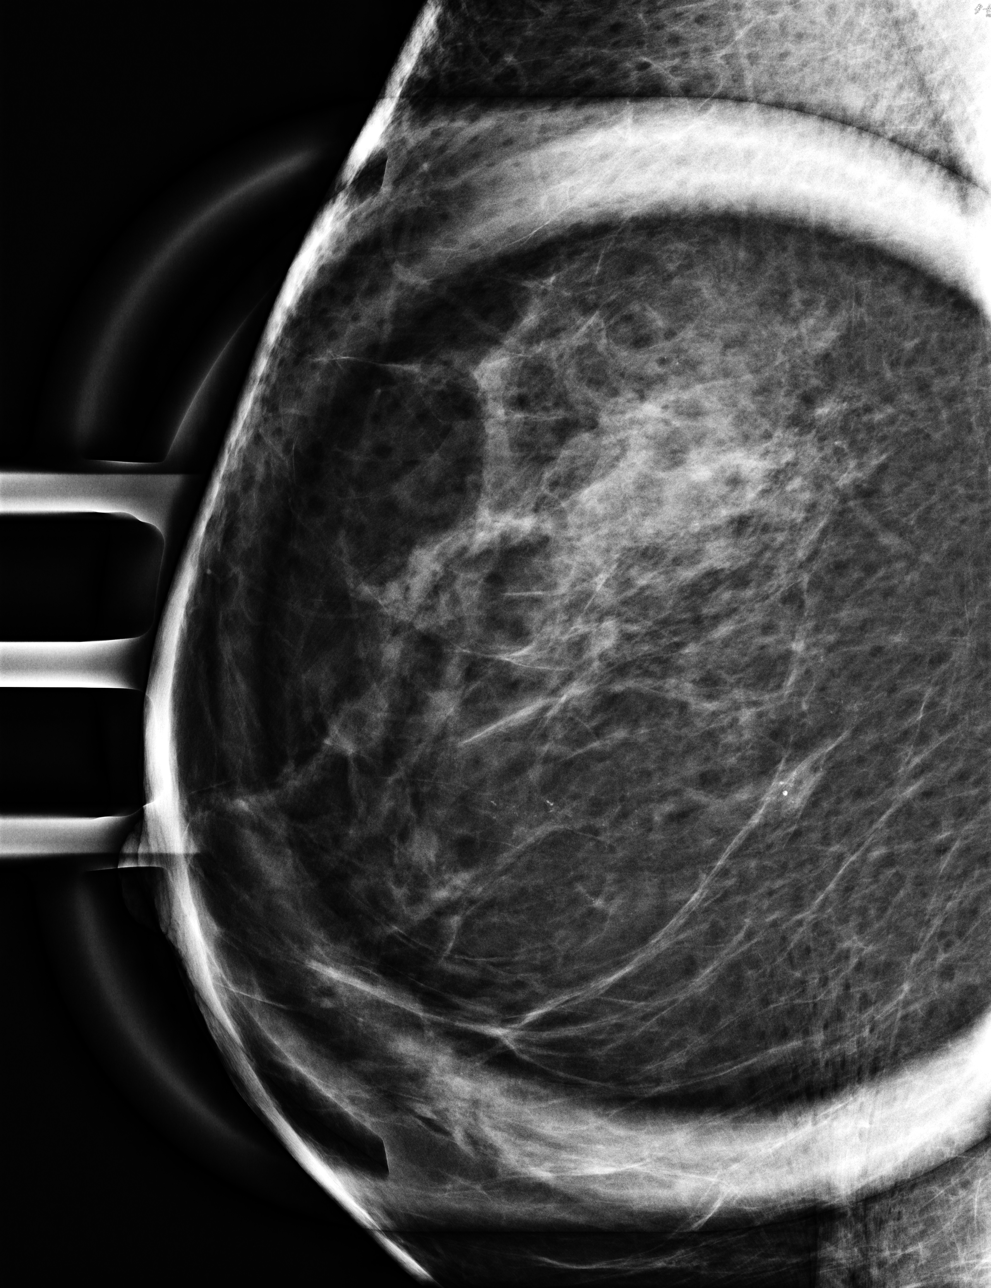

[4 of 4 positions shown; findings below may reference images not displayed]

ACR Breast Density Category c: The breast tissue is heterogeneously
dense, which may obscure small masses.
FINDINGS: Spot compression magnification views were performed of the lower
outer right breast. There is a 0.7 cm group of predominantly
amorphous calcifications measuring 0.7 cm with an associated
density/possible mass. Additional faint linear oriented
calcifications are seen extending 2.1 cm anterior to these
calcifications, possibly but not definitely vascular.

Mammographic images were processed with CAD.

Targeted ultrasound of the outer right breast was performed. Several
cysts and clusters of cysts are identified within the outer right
breast. A cluster of cysts at the [DATE] position 4 cm from nipple
measures 0.5 x 0.3 x 0.4 cm and an additional cluster of cysts at
the [DATE] position 3 cm from nipple measures 0.8 x 0.4 x 0.6 cm. One
of these areas of fibrocystic change could but not definitely
correspond with the small asymmetry/mass with associated
calcifications seen in the right breast.

No lymphadenopathy seen in the right axilla.
IMPRESSION: 1. Suspicious 0.7 cm group of calcifications with associated
mass/density within the outer right breast.

2. Indeterminate calcifications spanning 2.1 cm extending anteriorly
from this smaller group of calcifications, possibly but not
definitely vascular.

RECOMMENDATION:
1. Recommend stereotactic guided biopsy of the 0.7 cm group of
calcifications with associated density within the outer right
breast.

2. Recommend stereotactic guided biopsy of the additional 2.1 cm
group of calcifications in the outer right breast located anterior
to the smaller group of calcifications.

I have discussed the findings and recommendations with the patient.
If applicable, a reminder letter will be sent to the patient
regarding the next appointment.

BI-RADS CATEGORY  4: Suspicious.

## 2021-11-24 ENCOUNTER — Encounter: Payer: Self-pay | Admitting: Internal Medicine

## 2021-11-24 ENCOUNTER — Encounter: Payer: Managed Care, Other (non HMO) | Admitting: Dermatology

## 2021-11-29 ENCOUNTER — Other Ambulatory Visit: Payer: Self-pay | Admitting: General Surgery

## 2021-11-29 ENCOUNTER — Ambulatory Visit
Admission: RE | Admit: 2021-11-29 | Discharge: 2021-11-29 | Disposition: A | Discharge status: Home / Self Care | Payer: BC Managed Care – PPO | Source: Ambulatory Visit | Attending: General Surgery | Admitting: General Surgery

## 2021-11-29 DIAGNOSIS — Z853 Personal history of malignant neoplasm of breast: Secondary | ICD-10-CM

## 2021-11-29 DIAGNOSIS — R590 Localized enlarged lymph nodes: Secondary | ICD-10-CM | POA: Diagnosis not present

## 2021-12-13 ENCOUNTER — Ambulatory Visit (INDEPENDENT_AMBULATORY_CARE_PROVIDER_SITE_OTHER): Payer: BC Managed Care – PPO | Admitting: Dermatology

## 2021-12-13 ENCOUNTER — Other Ambulatory Visit: Payer: Self-pay | Admitting: Internal Medicine

## 2021-12-13 DIAGNOSIS — L820 Inflamed seborrheic keratosis: Secondary | ICD-10-CM | POA: Diagnosis not present

## 2021-12-13 DIAGNOSIS — Z1283 Encounter for screening for malignant neoplasm of skin: Secondary | ICD-10-CM

## 2021-12-13 DIAGNOSIS — Z853 Personal history of malignant neoplasm of breast: Secondary | ICD-10-CM | POA: Diagnosis not present

## 2021-12-13 DIAGNOSIS — D229 Melanocytic nevi, unspecified: Secondary | ICD-10-CM

## 2021-12-13 DIAGNOSIS — B009 Herpesviral infection, unspecified: Secondary | ICD-10-CM | POA: Diagnosis not present

## 2021-12-13 DIAGNOSIS — Q825 Congenital non-neoplastic nevus: Secondary | ICD-10-CM | POA: Diagnosis not present

## 2021-12-13 DIAGNOSIS — L578 Other skin changes due to chronic exposure to nonionizing radiation: Secondary | ICD-10-CM

## 2021-12-13 DIAGNOSIS — L821 Other seborrheic keratosis: Secondary | ICD-10-CM

## 2021-12-13 MED ORDER — VALACYCLOVIR HCL 500 MG PO TABS: 500.0000 mg | ORAL_TABLET | Freq: Two times a day (BID) | ORAL | 11 refills | Status: DC

## 2021-12-13 NOTE — Patient Instructions (Signed)
Due to recent changes in healthcare laws, you may see results of your pathology and/or laboratory studies on MyChart before the doctors have had a chance to review them. We understand that in some cases there may be results that are confusing or concerning to you. Please understand that not all results are received at the same time and often the doctors may need to interpret multiple results in order to provide you with the best plan of care or course of treatment. Therefore, we ask that you please give us 2 business days to thoroughly review all your results before contacting the office for clarification. Should we see a critical lab result, you will be contacted sooner.   If You Need Anything After Your Visit  If you have any questions or concerns for your doctor, please call our main line at [phone] and press option 4 to reach your doctor's medical assistant. If no one answers, please leave a voicemail as directed and we will return your call as soon as possible. Messages left after 4 pm will be answered the following business day.   You may also send us a message via MyChart. We typically respond to MyChart messages within 1-2 business days.  For prescription refills, please ask your pharmacy to contact our office. Our fax number is [phone].  If you have an urgent issue when the clinic is closed that cannot wait until the next business day, you can page your doctor at the number below.    Please note that while we do our best to be available for urgent issues outside of office hours, we are not available 24/7.   If you have an urgent issue and are unable to reach us, you may choose to seek medical care at your doctor's office, retail clinic, urgent care center, or emergency room.  If you have a medical emergency, please immediately call 911 or go to the emergency department.  Pager Numbers  - Dr. Kowalski: [phone]  - Dr. Moye: [phone]  - Dr. Stewart:  [phone]  In the event of inclement weather, please call our main line at [phone] for an update on the status of any delays or closures.  Dermatology Medication Tips: Please keep the boxes that topical medications come in in order to help keep track of the instructions about where and how to use these. Pharmacies typically print the medication instructions only on the boxes and not directly on the medication tubes.   If your medication is too expensive, please contact our office at [phone] option 4 or send us a message through MyChart.   We are unable to tell what your co-pay for medications will be in advance as this is different depending on your insurance coverage. However, we may be able to find a substitute medication at lower cost or fill out paperwork to get insurance to cover a needed medication.   If a prior authorization is required to get your medication covered by your insurance company, please allow us 1-2 business days to complete this process.  Drug prices often vary depending on where the prescription is filled and some pharmacies may offer cheaper prices.  The website www.goodrx.com contains coupons for medications through different pharmacies. The prices here do not account for what the cost may be with help from insurance (it may be cheaper with your insurance), but the website can give you the price if you did not use any insurance.  - You can print the associated coupon and take it with   your prescription to the pharmacy.  - You may also stop by our office during regular business hours and pick up a GoodRx coupon card.  - If you need your prescription sent electronically to a different pharmacy, notify our office through [facility] MyChart or by phone at [phone] option 4.     Si Usted Necesita Algo Despus de Su Visita  Tambin puede enviarnos un mensaje a travs de MyChart. Por lo general respondemos a los mensajes de MyChart en el transcurso de 1 a 2  das hbiles.  Para renovar recetas, por favor pida a su farmacia que se ponga en contacto con nuestra oficina. Nuestro nmero de fax es el [phone].  Si tiene un asunto urgente cuando la clnica est cerrada y que no puede esperar hasta el siguiente da hbil, puede llamar/localizar a su doctor(a) al nmero que aparece a continuacin.   Por favor, tenga en cuenta que aunque hacemos todo lo posible para estar disponibles para asuntos urgentes fuera del horario de oficina, no estamos disponibles las 24 horas del da, los 7 das de la semana.   Si tiene un problema urgente y no puede comunicarse con nosotros, puede optar por buscar atencin mdica  en el consultorio de su doctor(a), en una clnica privada, en un centro de atencin urgente o en una sala de emergencias.  Si tiene una emergencia mdica, por favor llame inmediatamente al 911 o vaya a la sala de emergencias.  Nmeros de bper  - Dr. Kowalski: [phone]  - Dra. Moye: [phone]  - Dra. Stewart: [phone]  En caso de inclemencias del tiempo, por favor llame a nuestra lnea principal al [phone] para una actualizacin sobre el estado de cualquier retraso o cierre.  Consejos para la medicacin en dermatologa: Por favor, guarde las cajas en las que vienen los medicamentos de uso tpico para ayudarle a seguir las instrucciones sobre dnde y cmo usarlos. Las farmacias generalmente imprimen las instrucciones del medicamento slo en las cajas y no directamente en los tubos del medicamento.   Si su medicamento es muy caro, por favor, pngase en contacto con nuestra oficina llamando al [phone] y presione la opcin 4 o envenos un mensaje a travs de MyChart.   No podemos decirle cul ser su copago por los medicamentos por adelantado ya que esto es diferente dependiendo de la cobertura de su seguro. Sin embargo, es posible que podamos encontrar un medicamento sustituto a menor costo o llenar un formulario para que el  seguro cubra el medicamento que se considera necesario.   Si se requiere una autorizacin previa para que su compaa de seguros cubra su medicamento, por favor permtanos de 1 a 2 das hbiles para completar este proceso.  Los precios de los medicamentos varan con frecuencia dependiendo del lugar de dnde se surte la receta y alguna farmacias pueden ofrecer precios ms baratos.  El sitio web www.goodrx.com tiene cupones para medicamentos de diferentes farmacias. Los precios aqu no tienen en cuenta lo que podra costar con la ayuda del seguro (puede ser ms barato con su seguro), pero el sitio web puede darle el precio si no utiliz ningn seguro.  - Puede imprimir el cupn correspondiente y llevarlo con su receta a la farmacia.  - Tambin puede pasar por nuestra oficina durante el horario de atencin regular y recoger una tarjeta de cupones de GoodRx.  - Si necesita que su receta se enve electrnicamente a una farmacia diferente, informe a nuestra oficina a travs de MyChart de [place] [facility]   o por telfono llamando al [phone] y presione la opcin 4.  

## 2021-12-13 NOTE — Progress Notes (Unsigned)
Follow-Up Visit   Subjective  Connie Bradford is a 61 y.o. female who presents for the following: Annual Exam (History of BCC - The patient presents for Total-Body Skin Exam (TBSE) for skin cancer screening and mole check.  The patient has spots, moles and lesions to be evaluated, some may be new or changing and the patient has concerns that these could be cancer./).  The following portions of the chart were reviewed this encounter and updated as appropriate:   Tobacco  Allergies  Meds  Problems  Med Hx  Surg Hx  Fam Hx     Review of Systems:  No other skin or systemic complaints except as noted in HPI or Assessment and Plan.  Objective  Well appearing patient in no apparent distress; mood and affect are within normal limits.  A full examination was performed including scalp, head, eyes, ears, nose, lips, neck, chest, axillae, abdomen, back, buttocks, bilateral upper extremities, bilateral lower extremities, hands, feet, fingers, toes, fingernails, and toenails. All findings within normal limits unless otherwise noted below.  Right forehead x 1, left cheek x 1 (2) Erythematous stuck-on, waxy papule or plaque  Right mid back braline Brown macule  Left Hip (side) - Posterior Pinkness  Right Breast Well healed lumpectomy scar   Assessment & Plan  Inflamed seborrheic keratosis (2) Right forehead x 1, left cheek x 1  Destruction of lesion - Right forehead x 1, left cheek x 1 Complexity: simple   Destruction method: cryotherapy   Informed consent: discussed and consent obtained   Timeout:  patient name, date of birth, surgical site, and procedure verified Lesion destroyed using liquid nitrogen: Yes   Region frozen until ice ball extended beyond lesion: Yes   Outcome: patient tolerated procedure well with no complications   Post-procedure details: wound care instructions given    Congenital non-neoplastic nevus Right mid back braline No changes compared to photo 09/2020.  Benign-appearing.  Observation.  Call clinic for new or changing lesions.  Recommend daily use of broad spectrum spf 30+ sunscreen to sun-exposed areas.    HSV infection Buttocks Currently flared Continue Valacyclovir 500 mg 1 po qd for prophylactic prevention-but increase to 2 po qd or 1 p.o. twice daily x 5 days for flares Herpes Simplex Virus = Cold Sores = Fever Blisters is a chronic recurring blistering; scabbing sore-producing viral infection that is recurrent usually in the same area triggered by stress, sun/UV exposure and trauma.  It is infectious and can be spread from person to person by direct contact.  It is not curable, but is treatable with topical and oral medication. Active flare today valACYclovir (VALTREX) 500 MG tablet - Left Hip (side) - Posterior Take 1 tablet (500 mg total) by mouth 2 (two) times daily.  History of breast cancer Right Breast Clear.  No lymphadenopathy.  Observe for recurrence. Call clinic for new or changing lesions.  Recommend regular skin exams, daily broad-spectrum spf 30+ sunscreen use, and photoprotection.    Actinic Damage - chronic, secondary to cumulative UV radiation exposure/sun exposure over time - diffuse scaly erythematous macules with underlying dyspigmentation - Recommend daily broad spectrum sunscreen SPF 30+ to sun-exposed areas, reapply every 2 hours as needed.  - Recommend staying in the shade or wearing long sleeves, sun glasses (UVA+UVB protection) and wide brim hats (4-inch brim around the entire circumference of the hat). - Call for new or changing lesions.  Melanocytic Nevi - Tan-brown and/or pink-flesh-colored symmetric macules and papules - Benign appearing  on exam today - Observation - Call clinic for new or changing moles - Recommend daily use of broad spectrum spf 30+ sunscreen to sun-exposed areas.   Seborrheic Keratoses - Stuck-on, waxy, tan-brown papules and/or plaques  - Benign-appearing - Discussed benign  etiology and prognosis. - Observe - Call for any changes  Return in about 1 year (around 12/14/2022) for TBSE.  I, Ashok Cordia, CMA, am acting as scribe for Sarina Ser, MD . Documentation: I have reviewed the above documentation for accuracy and completeness, and I agree with the above.  Sarina Ser, MD

## 2021-12-14 ENCOUNTER — Encounter: Payer: Self-pay | Admitting: Dermatology

## 2021-12-15 DIAGNOSIS — C50411 Malignant neoplasm of upper-outer quadrant of right female breast: Secondary | ICD-10-CM | POA: Diagnosis not present

## 2021-12-27 ENCOUNTER — Inpatient Hospital Stay: Payer: BC Managed Care – PPO | Attending: Internal Medicine

## 2021-12-27 DIAGNOSIS — C50411 Malignant neoplasm of upper-outer quadrant of right female breast: Secondary | ICD-10-CM | POA: Diagnosis not present

## 2021-12-27 DIAGNOSIS — Z452 Encounter for adjustment and management of vascular access device: Secondary | ICD-10-CM | POA: Insufficient documentation

## 2021-12-27 DIAGNOSIS — Z170 Estrogen receptor positive status [ER+]: Secondary | ICD-10-CM | POA: Insufficient documentation

## 2021-12-27 DIAGNOSIS — Z95828 Presence of other vascular implants and grafts: Secondary | ICD-10-CM

## 2021-12-27 MED ORDER — SODIUM CHLORIDE 0.9% FLUSH
10.0000 mL | Freq: Once | INTRAVENOUS | Status: AC
  Administered 2021-12-27: 10 mL via INTRAVENOUS
  Filled 2021-12-27: qty 10

## 2021-12-27 MED ORDER — HEPARIN SOD (PORK) LOCK FLUSH 100 UNIT/ML IV SOLN
500.0000 [IU] | Freq: Once | INTRAVENOUS | Status: AC
  Administered 2021-12-27: 500 [IU] via INTRAVENOUS
  Filled 2021-12-27: qty 5

## 2021-12-28 IMAGING — DX DG CHEST 1V PORT
1 series · 1 of 1 positions shown · non-contrast
Comparison: None.

CLINICAL DATA: Status post Port-A-Cath placement.

EXAM:
PORTABLE CHEST 1 VIEW

[chest ap]
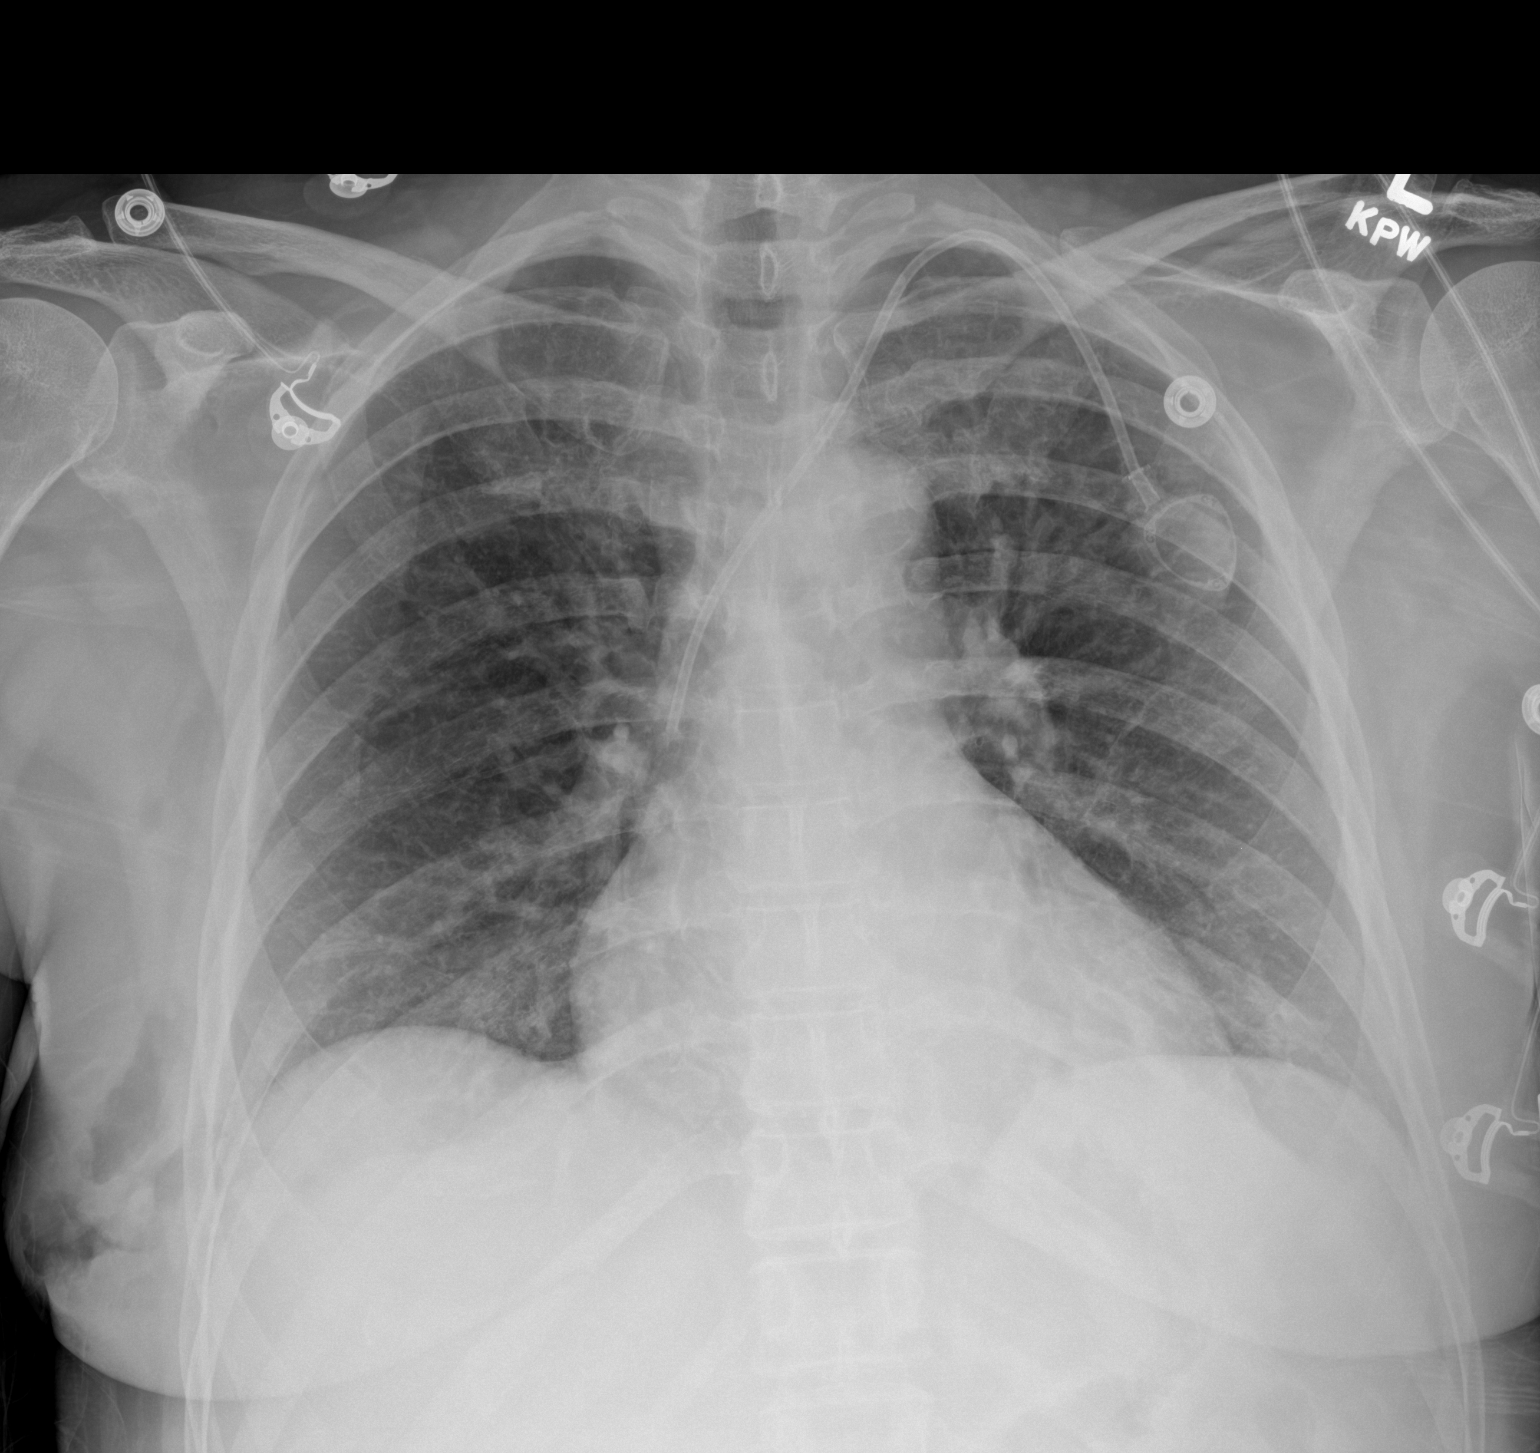

[1 of 1 positions shown; findings below may reference images not displayed]

FINDINGS: The heart size and mediastinal contours are within normal limits.
Both lungs are clear. Left internal jugular Port-A-Cath is noted
with tip in expected position of the SVC. No pneumothorax or pleural
effusion is noted. The visualized skeletal structures are
unremarkable.
IMPRESSION: Status post left internal jugular Port-A-Cath placement. No
pneumothorax is noted.

## 2022-02-03 ENCOUNTER — Ambulatory Visit: Payer: Managed Care, Other (non HMO) | Admitting: Radiation Oncology

## 2022-02-15 ENCOUNTER — Encounter: Payer: Self-pay | Admitting: Internal Medicine

## 2022-02-17 ENCOUNTER — Inpatient Hospital Stay: Payer: BC Managed Care – PPO | Attending: Internal Medicine

## 2022-02-17 DIAGNOSIS — C50411 Malignant neoplasm of upper-outer quadrant of right female breast: Secondary | ICD-10-CM | POA: Diagnosis not present

## 2022-02-17 DIAGNOSIS — Z170 Estrogen receptor positive status [ER+]: Secondary | ICD-10-CM | POA: Diagnosis not present

## 2022-02-17 DIAGNOSIS — Z95828 Presence of other vascular implants and grafts: Secondary | ICD-10-CM

## 2022-02-17 DIAGNOSIS — Z452 Encounter for adjustment and management of vascular access device: Secondary | ICD-10-CM | POA: Diagnosis not present

## 2022-02-17 MED ORDER — HEPARIN SOD (PORK) LOCK FLUSH 100 UNIT/ML IV SOLN
500.0000 [IU] | Freq: Once | INTRAVENOUS | Status: AC
  Administered 2022-02-17: 500 [IU] via INTRAVENOUS
  Filled 2022-02-17: qty 5

## 2022-02-17 MED ORDER — SODIUM CHLORIDE 0.9% FLUSH
10.0000 mL | Freq: Once | INTRAVENOUS | Status: AC
  Administered 2022-02-17: 10 mL via INTRAVENOUS
  Filled 2022-02-17: qty 10

## 2022-03-09 ENCOUNTER — Ambulatory Visit
Admission: RE | Admit: 2022-03-09 | Discharge: 2022-03-09 | Disposition: A | Discharge status: Home / Self Care | Payer: BC Managed Care – PPO | Source: Ambulatory Visit | Attending: Radiation Oncology | Admitting: Radiation Oncology

## 2022-03-09 ENCOUNTER — Encounter: Payer: Self-pay | Admitting: Radiation Oncology

## 2022-03-09 DIAGNOSIS — Z923 Personal history of irradiation: Secondary | ICD-10-CM | POA: Insufficient documentation

## 2022-03-09 DIAGNOSIS — Z170 Estrogen receptor positive status [ER+]: Secondary | ICD-10-CM | POA: Insufficient documentation

## 2022-03-09 DIAGNOSIS — Z79811 Long term (current) use of aromatase inhibitors: Secondary | ICD-10-CM | POA: Insufficient documentation

## 2022-03-09 DIAGNOSIS — C50411 Malignant neoplasm of upper-outer quadrant of right female breast: Secondary | ICD-10-CM | POA: Insufficient documentation

## 2022-03-09 NOTE — Progress Notes (Signed)
Radiation Oncology Follow up Note  Name: Connie Bradford   Date:   03/09/2022 MRN:  [id number] DOB: [birth year]    This 61 y.o. female presents to the clinic today for 2-year follow-up status post whole breast radiation to her right breast for stage Ia triple positive invasive mammary carcinoma.  REFERRING PROVIDER: Birdie Sons, MD  HPI: Patient is a 61 year old female now out 2 years having pleated whole breast radiation to her right breast for stage Ia triple positive invasive mammary carcinoma.  Seen today in routine follow-up for her breast) she is doing well she has been having some upper back pain with some shooting type pains into her bilateral anterior chest.  Not sure the etiology of that..  She had mammograms back in April showing a mild enlargement of other wise more fairly benign appearing left axillary lymph node and some progressive coarsening right breast calcifications consistent with benign dystrophic calcifications.  She had a follow-up ultrasound of her left axilla in August which was negative.  She is currently on Arimidex tolerating it well without side effect.  COMPLICATIONS OF TREATMENT: none  FOLLOW UP COMPLIANCE: keeps appointments   PHYSICAL EXAM:  BP (!) (P) 141/64 (BP Location: Left Arm, Patient Position: Sitting)   Temp (P) 98 F (36.7 C) (Tympanic)   Resp (P) 18   Ht (P) '5\' 6"'$  (1.676 m)   Wt (P) 183 lb (83 kg)   LMP 12/18/2013   BMI (P) 29.54 kg/m  Lungs are clear to A&P cardiac examination essentially unremarkable with regular rate and rhythm. No dominant mass or nodularity is noted in either breast in 2 positions examined. Incision is well-healed. No axillary or supraclavicular adenopathy is appreciated. Cosmetic result is excellent.  Well-developed well-nourished patient in NAD. HEENT reveals PERLA, EOMI, discs not visualized.  Oral cavity is clear. No oral mucosal lesions are identified. Neck is clear without evidence of cervical or supraclavicular  adenopathy. Lungs are clear to A&P. Cardiac examination is essentially unremarkable with regular rate and rhythm without murmur rub or thrill. Abdomen is benign with no organomegaly or masses noted. Motor sensory and DTR levels are equal and symmetric in the upper and lower extremities. Cranial nerves II through XII are grossly intact. Proprioception is intact. No peripheral adenopathy or edema is identified. No motor or sensory levels are noted. Crude visual fields are within normal range.  RADIOLOGY RESULTS: Mammogram and ultrasound reviewed compatible with above-stated findings  PLAN: Present time patient is doing well now 2 years with no evidence of disease.  I have asked her to inform Dr. B at her next follow-up in January showed her back pain persists she may need an MRI scan of her thoracic spine.  Otherwise and pleased with her overall progress I asked to see her back in 1 year for follow-up.  She continues on Arimidex without side effect.  Patient is to call with any concerns  .  I would like to take this opportunity to thank you for allowing me to participate in the care of your patient.Noreene Filbert, MD

## 2022-03-31 ENCOUNTER — Ambulatory Visit
Admission: RE | Admit: 2022-03-31 | Discharge: 2022-03-31 | Disposition: A | Discharge status: Home / Self Care | Payer: BC Managed Care – PPO | Source: Ambulatory Visit | Attending: Internal Medicine | Admitting: Internal Medicine

## 2022-03-31 DIAGNOSIS — Z170 Estrogen receptor positive status [ER+]: Secondary | ICD-10-CM | POA: Diagnosis not present

## 2022-03-31 DIAGNOSIS — C50411 Malignant neoplasm of upper-outer quadrant of right female breast: Secondary | ICD-10-CM | POA: Diagnosis not present

## 2022-03-31 DIAGNOSIS — Z780 Asymptomatic menopausal state: Secondary | ICD-10-CM | POA: Diagnosis not present

## 2022-03-31 DIAGNOSIS — M85852 Other specified disorders of bone density and structure, left thigh: Secondary | ICD-10-CM | POA: Diagnosis not present

## 2022-04-26 ENCOUNTER — Other Ambulatory Visit: Payer: Self-pay | Admitting: Internal Medicine

## 2022-04-27 NOTE — Telephone Encounter (Signed)
last visit 09/24/21 next visit 04/29/22

## 2022-04-29 ENCOUNTER — Inpatient Hospital Stay: Payer: BC Managed Care – PPO

## 2022-04-29 ENCOUNTER — Inpatient Hospital Stay: Payer: BC Managed Care – PPO | Attending: Internal Medicine

## 2022-04-29 ENCOUNTER — Inpatient Hospital Stay (HOSPITAL_BASED_OUTPATIENT_CLINIC_OR_DEPARTMENT_OTHER): Payer: BC Managed Care – PPO | Admitting: Internal Medicine

## 2022-04-29 ENCOUNTER — Encounter: Payer: Self-pay | Admitting: Internal Medicine

## 2022-04-29 VITALS — BP 133/67 | HR 66 | Temp 97.8°F | Resp 18 | Wt 188.8 lb

## 2022-04-29 DIAGNOSIS — M8580 Other specified disorders of bone density and structure, unspecified site: Secondary | ICD-10-CM | POA: Diagnosis not present

## 2022-04-29 DIAGNOSIS — M85852 Other specified disorders of bone density and structure, left thigh: Secondary | ICD-10-CM

## 2022-04-29 DIAGNOSIS — C50411 Malignant neoplasm of upper-outer quadrant of right female breast: Secondary | ICD-10-CM

## 2022-04-29 DIAGNOSIS — Z452 Encounter for adjustment and management of vascular access device: Secondary | ICD-10-CM | POA: Diagnosis not present

## 2022-04-29 DIAGNOSIS — Z170 Estrogen receptor positive status [ER+]: Secondary | ICD-10-CM | POA: Insufficient documentation

## 2022-04-29 DIAGNOSIS — Z79811 Long term (current) use of aromatase inhibitors: Secondary | ICD-10-CM | POA: Insufficient documentation

## 2022-04-29 DIAGNOSIS — Z95828 Presence of other vascular implants and grafts: Secondary | ICD-10-CM

## 2022-04-29 LAB — CBC WITH DIFFERENTIAL/PLATELET
Abs Immature Granulocytes: 0.03 10*3/uL (ref 0.00–0.07)
Basophils Absolute: 0.1 10*3/uL (ref 0.0–0.1)
Basophils Relative: 1 %
Eosinophils Absolute: 0.2 10*3/uL (ref 0.0–0.5)
Eosinophils Relative: 3 %
HCT: 38 % (ref 36.0–46.0)
Hemoglobin: 12.6 g/dL (ref 12.0–15.0)
Immature Granulocytes: 1 %
Lymphocytes Relative: 24 %
Lymphs Abs: 1.5 10*3/uL (ref 0.7–4.0)
MCH: 29.2 pg (ref 26.0–34.0)
MCHC: 33.2 g/dL (ref 30.0–36.0)
MCV: 88 fL (ref 80.0–100.0)
Monocytes Absolute: 0.6 10*3/uL (ref 0.1–1.0)
Monocytes Relative: 9 %
Neutro Abs: 4 10*3/uL (ref 1.7–7.7)
Neutrophils Relative %: 62 %
Platelets: 191 10*3/uL (ref 150–400)
RBC: 4.32 MIL/uL (ref 3.87–5.11)
RDW: 13.2 % (ref 11.5–15.5)
WBC: 6.3 10*3/uL (ref 4.0–10.5)
nRBC: 0 % (ref 0.0–0.2)

## 2022-04-29 LAB — COMPREHENSIVE METABOLIC PANEL
ALT: 71 U/L — ABNORMAL HIGH (ref 0–44)
AST: 68 U/L — ABNORMAL HIGH (ref 15–41)
Albumin: 4.1 g/dL (ref 3.5–5.0)
Alkaline Phosphatase: 81 U/L (ref 38–126)
Anion gap: 12 (ref 5–15)
BUN: 14 mg/dL (ref 6–20)
CO2: 23 mmol/L (ref 22–32)
Calcium: 9.1 mg/dL (ref 8.9–10.3)
Chloride: 102 mmol/L (ref 98–111)
Creatinine, Ser: 0.77 mg/dL (ref 0.44–1.00)
GFR, Estimated: >60 mL/min (ref >60)
Glucose, Bld: 141 mg/dL — ABNORMAL HIGH (ref 70–99)
Potassium: 3.6 mmol/L (ref 3.5–5.1)
Sodium: 137 mmol/L (ref 135–145)
Total Bilirubin: 0.4 mg/dL (ref 0.3–1.2)
Total Protein: 6.8 g/dL (ref 6.5–8.1)

## 2022-04-29 MED ORDER — SODIUM CHLORIDE 0.9% FLUSH
10.0000 mL | Freq: Once | INTRAVENOUS | Status: AC | PRN
  Administered 2022-04-29: 10 mL
  Filled 2022-04-29: qty 10

## 2022-04-29 MED ORDER — HEPARIN SOD (PORK) LOCK FLUSH 100 UNIT/ML IV SOLN
500.0000 [IU] | Freq: Once | INTRAVENOUS | Status: AC | PRN
  Administered 2022-04-29: 500 [IU]
  Filled 2022-04-29: qty 5

## 2022-04-29 MED ORDER — SODIUM CHLORIDE 0.9 % IV SOLN
Freq: Once | INTRAVENOUS | Status: AC
  Filled 2022-04-29: qty 250

## 2022-04-29 MED ORDER — ALTEPLASE 2 MG IJ SOLR
2.0000 mg | Freq: Once | INTRAMUSCULAR | Status: AC
  Administered 2022-04-29: 2 mg
  Filled 2022-04-29: qty 2

## 2022-04-29 MED ORDER — ZOLEDRONIC ACID 4 MG/100ML IV SOLN
4.0000 mg | Freq: Once | INTRAVENOUS | Status: AC
  Administered 2022-04-29: 4 mg via INTRAVENOUS
  Filled 2022-04-29: qty 100

## 2022-04-29 NOTE — Assessment & Plan Note (Addendum)
#  Right breast-invasive mammary carcinoma-s/p lumpectomy- STAGE I- TRIPLE POSITIVE; s/p  Herceptin maintenance [May 2022]. On Anastrazole.  Stable. APRIL 2023 [Dr.Cintron] BIL Mamm-Right calcifications attributed to treatment.  Patient tolerating anastrozole well.  #April 2023 incidental left axillary adenopathy- left mammo [? Allergic reaction? Detergent] - AUG 2023- US- WNL>   # Hot flashes- G-1- sec to anastrazole.  Stable  # OSTEOPENIA -BMD [ dec 2021-T-score of -1.2].- on ADJUVANT zometa q 6 M [jan 6th,2022 x3 years;  T-score of -1.3. [to decide]  # SWOG study/PN study- PN- G-0-1 monitor for now. STABLE  # Fatty liver- on [place]- AST/ALT  -grade 1 slightly elevated;exercising/yoga- STABLE.  # port-malfunction today- s/p cathflo-again reviewed/ discussed re: port ex-plantation; will keep for now for zometa/mammogram.    # Disposition: # Zometa  # in 3 month- port flush # follow up in 6 months-   MD;port ;labs- cbc/cmp; vit D 25- [place] levels; Zometa; -;Dr.B

## 2022-04-29 NOTE — Progress Notes (Signed)
Patient here today for follow up regarding breast cancer, zometa. Patient denies concerns today.

## 2022-04-29 NOTE — Patient Instructions (Signed)

## 2022-04-29 NOTE — Progress Notes (Signed)
[place] NOTE  Patient Care Team: Birdie Sons, MD as PCP - General (Family Medicine) [facility] (Gynecology) Theodore Demark, RN (Inactive) as Oncology Nurse Navigator Cammie Sickle, MD as Consulting Physician (Hematology and Oncology) Cammie Sickle, MD as Consulting Physician (Internal Medicine) Noreene Filbert, MD as Consulting Physician (Radiation Oncology) Herbert Pun, MD as Consulting Physician (General Surgery)  CHIEF COMPLAINTS/PURPOSE OF CONSULTATION: Breast cancer  #  Oncology History Overview Note  # MARCH 2021- RUOQ- 103m- [BREAST, RIGHT, LATERAL POSTERIOR  - INVASIVE MAMMARY CARCINOMA, NO SPECIAL TYPE, ASSOCIATED WITH  CALCIFICATIONS.][place] ER-> 90%; PR- 50-90%; HER 2 FISH- POSITIVE  # S/P LUMPECTOMY [Dr.Cintron] pT1a [460m;pN0 [STAGE IA- ER/PRpoS; Her 2 POS]  . BREAST, RIGHT, LATERAL MIDDLE DEPTH (RIBBON-SHAPED CLIP):  STEREOTACTIC-GUIDED CORE BIOPSY: - DUCTAL CARCINOMA IN SITU (DCIS), HIGH-GRADE, ASSOCIATED WITH  CALCIFICATIONS.   # May 14th,2021- TH-H; s/p RT [finished OCT 2021]; NOV 23rd 2021- start Anastrazole; ; s/p  Herceptin maintenance [May 2022].  #Hypothyroidism-methimazole [Dr.Solum]  # SURVIVORSHIP:   # GENETICS:   DIAGNOSIS: Breast cancer  STAGE:   1      ;  GOALS: Cure  CURRENT/MOST RECENT THERAPY : Taxol Herceptin   Carcinoma of upper-outer quadrant of right breast in female, estrogen receptor positive ([facility] 07/15/2019 Initial Diagnosis   Carcinoma of upper-outer quadrant of right breast in female, estrogen receptor positive ([facility]  08/28/2019 - 09/03/2020 Chemotherapy   Patient is on Treatment Plan : BREAST weekly PACLitaxel / trastuzumab / Maintenance trastuzumab every 21 days       HISTORY OF PRESENTING ILLNESS: Alone.  Ambulating independently.  AnCurtis Uriartemall [age].o.  female with breast cancer early stage-ER/PR positive HER-2/neu positive on adjuvant anastrozole/adjuvant zometa is  here for follow-up.  Patient here today for follow up regarding breast cancer, zometa. Patient denies concerns today.   Patient denies any joint pains. denies any significant hot flashes. Denies any chest pain or shortness of breath or cough.  No nausea no vomiting.  Patient admits to compliance with her anastrozole.  Review of Systems  Constitutional:  Positive for malaise/fatigue. Negative for chills, diaphoresis, fever and weight loss.  HENT:  Negative for nosebleeds and sore throat.   Eyes:  Negative for double vision.  Respiratory:  Negative for cough, hemoptysis, sputum production, shortness of breath and wheezing.   Cardiovascular:  Positive for leg swelling. Negative for chest pain, palpitations and orthopnea.  Gastrointestinal:  Negative for abdominal pain, blood in stool, constipation, diarrhea, heartburn, melena, nausea and vomiting.  Genitourinary:  Negative for dysuria, frequency and urgency.  Musculoskeletal:  Negative for back pain and joint pain.  Skin:  Negative for itching.  Neurological:  Negative for dizziness, focal weakness, weakness and headaches.  Endo/Heme/Allergies:  Does not bruise/bleed easily.  Psychiatric/Behavioral:  Negative for depression. The patient is not nervous/anxious and does not have insomnia.      MEDICAL HISTORY:  Past Medical History:  Diagnosis Date   Atypical mole 09/26/2018   right mid anterior side/mild   Basal cell carcinoma 08/25/2014   right med cheek infraorbital   [place]ast cancer ([facility]   [place] DCIS   Dyslipidemia    Family history of bladder cancer    Graves disease    Hyperthyroidism    Inborn lipid storage disorder    Leiomyoma of uterus    Personal history of chemotherapy 2021   RIGHT lumpectomy   Personal history of radiation therapy 2021   RIGHT  lumpectomy   Vitamin D deficiency     SURGICAL HISTORY: Past Surgical History:  Procedure Laterality Date   APPENDECTOMY  1982   Cyst removed from ovaries     BREAST BIOPSY Right 07/05/2019   stereo bx, coil clip, [facility]   BREAST BIOPSY Right 07/05/2019   stereo bx, ribbon clip, DCIS    BREAST LUMPECTOMY Right 07/31/2019    [facility] and DCIS with SN bx   COLONOSCOPY WITH PROPOFOL N/A 01/20/2017   Procedure: COLONOSCOPY WITH PROPOFOL;  Surgeon: Jonathon Bellows, MD;  Location: [facility] ENDOSCOPY;  Service: Gastroenterology;  Laterality: N/A;   [place]   NECK SURGERY  2013   LIft from weight loss   PARTIAL MASTECTOMY WITH NEEDLE LOCALIZATION AND AXILLARY SENTINEL LYMPH NODE BX Right 07/31/2019   Procedure: RIGHT PARTIAL MASTECTOMY WITH BRACKETED NEEDLE LOCALIZATION AND LEFT AXILLARY SENTINEL LYMPH NODE BIOPSY;  Surgeon: Herbert Pun, MD;  Location: [facility] ORS;  Service: General;  Laterality: Right;   PORTACATH PLACEMENT Left 07/31/2019   Procedure: INSERTION PORT-A-CATH LEFT INTERNAL JUGULAR;  Surgeon: Herbert Pun, MD;  Location: [facility] ORS;  Service: General;  Laterality: Left;   TONSILLECTOMY  1969    SOCIAL HISTORY: Social History   Socioeconomic History   Marital status: Married    Spouse name: Not on file   Number of children: 0   Years of education: Coll Grad   Highest education level: Not on file  Occupational History   Occupation: Full-Time    Comment: [employer] x9  Tobacco Use   Smoking status: Former   Smokeless tobacco: Never  Scientific laboratory technician Use: Never used  Substance and Sexual Activity   Alcohol use: Yes    Alcohol/week: 0.0 standard drinks of alcohol    Comment: RARELY   Drug use: No   Sexual activity: Not Currently    Birth control/protection: Post-menopausal  Other Topics Concern   Not on file  Social History Narrative   Lives close to [place]; with husband. Quit smoking in 2007 [25 years]; ocassional alcohol. 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    Social Determinants of Health   Financial Resource Strain: Not on file  Food Insecurity: Not on file  Transportation Needs: Not on  file  Physical Activity: Sufficiently Active (05/26/2017)   Exercise Vital Sign    Days of Exercise per Week: 5 days    Minutes of Exercise per Session: 60 min  Stress: No Stress Concern Present (05/26/2017)   [place]    Feeling of Stress : Not at all  Social Connections: Moderately Integrated (05/26/2017)   Social Connection and Isolation Panel [NHANES]    Frequency of Communication with Friends and Family: More than three times a week    Frequency of Social Gatherings with Friends and Family: More than three times a week    Attends Religious Services: More than 4 times per year    Active Member of [employer] or Organizations: No    Attends Archivist Meetings: Never    Marital Status: Married  Human resources officer Violence: Not At Risk (05/26/2017)   Humiliation, Afraid, Rape, and Kick questionnaire    Fear of Current or Ex-Partner: No    Emotionally Abused: No    Physically Abused: No    Sexually Abused: No    FAMILY HISTORY: Family History  Problem Relation Age of Onset   Bladder Cancer Mother    Diabetes Father    Heart  disease Father    Bladder Cancer Brother    Breast cancer Neg Hx     ALLERGIES:  is allergic to penicillins.  MEDICATIONS:  Current Outpatient Medications  Medication Sig Dispense Refill   anastrozole (ARIMIDEX) 1 MG tablet Take 1 tablet by mouth once daily 90 tablet 0   B COMPLEX VITAMINS PO Take 1 tablet by mouth daily.      Cholecalciferol (VITAMIN D) 125 MCG (5000 UT) CAPS Take 5,000 Units by mouth daily.      COLLAGEN PO Take 1 tablet by mouth daily. Super Collagen w/Biotin     ibuprofen (ADVIL) 200 MG tablet Take 600 mg by mouth every 8 (eight) hours as needed (for pain.).      lidocaine-prilocaine (EMLA) cream Apply 1 application topically as needed. 30 g 0   methimazole (TAPAZOLE) 5 MG tablet Take 5 mg by mouth daily.      Multiple Vitamin (MULTIVITAMIN WITH MINERALS) TABS tablet  Take 1 tablet by mouth daily.     Omega 3-6-9 Fatty Acids (OMEGA 3-6-9 COMPLEX) CAPS Take 1 capsule by mouth daily.     valACYclovir (VALTREX) 500 MG tablet Take 1 tablet (500 mg total) by mouth daily. 30 tablet 5   chlorhexidine (PERIDEX) 0.12 % solution USE 15MLS IN THE MOUTH OR THROAT TWICE DAILY AS DIRECTED (Patient not taking: Reported on 07/20/2021) 473 mL 0   clobetasol cream (TEMOVATE) 4.33 % Apply 1 application. topically 2 (two) times daily. (Patient not taking: Reported on 04/29/2022) 60 g 1   magic mouthwash (lidocaine, diphenhydrAMINE, alum & mag hydroxide) suspension Swish and swallow 5 mLs 4 (four) times daily as needed for mouth pain. (Patient not taking: Reported on 04/29/2022) 360 mL 1   ondansetron (ZOFRAN) 8 MG tablet One pill every 8 hours as needed for nausea/vomitting. (Patient not taking: Reported on 04/29/2022) 40 tablet 1   prochlorperazine (COMPAZINE) 10 MG tablet Take 1 tablet (10 mg total) by mouth every 6 (six) hours as needed for nausea or vomiting. (Patient not taking: Reported on 04/29/2022) 40 tablet 1   No current facility-administered medications for this visit.    PHYSICAL EXAMINATION: ECOG PERFORMANCE STATUS: 0 - Asymptomatic  Vitals:   04/29/22 1330  BP: 133/67  Pulse: 66  Resp: 18  Temp: 97.8 F (36.6 C)   Filed Weights   04/29/22 1330  Weight: 188 lb 12.8 oz (85.6 kg)    Physical Exam HENT:     Head: Normocephalic and atraumatic.     Mouth/Throat:     Pharynx: No oropharyngeal exudate.  Eyes:     Pupils: Pupils are equal, round, and reactive to light.  Cardiovascular:     Rate and Rhythm: Normal rate and regular rhythm.  Pulmonary:     Effort: Pulmonary effort is normal. No respiratory distress.     Breath sounds: Normal breath sounds. No wheezing.  Abdominal:     General: Bowel sounds are normal. There is no distension.     Palpations: Abdomen is soft. There is no mass.     Tenderness: There is no abdominal tenderness. There is no  guarding or rebound.  Musculoskeletal:        General: No tenderness. Normal range of motion.     Cervical back: Normal range of motion and neck supple.  Skin:    General: Skin is warm.  Neurological:     Mental Status: She is alert and oriented to person, place, and time.  Psychiatric:  Mood and Affect: Affect normal.    LABORATORY DATA:  I have reviewed the data as listed Lab Results  Component Value Date   WBC 6.3 04/29/2022   HGB 12.6 04/29/2022   HCT 38.0 04/29/2022   MCV 88.0 04/29/2022   PLT 191 04/29/2022   Recent Labs    07/20/21 1350 09/24/21 1243 04/29/22 1248  NA 143 139 137  K 4.0 4.0 3.6  CL 103 105 102  CO2 '24 26 23  '$ GLUCOSE 83 159* 141*  BUN '13 12 14  '$ CREATININE 0.87 0.69 0.77  CALCIUM 10.5* 9.8 9.1  GFRNONAA  --  >60 >60  PROT 7.2 7.1 6.8  ALBUMIN 4.6 4.1 4.1  AST 85* 72* 68*  ALT 119* 106* 71*  ALKPHOS 83 71 81  BILITOT 0.5 0.5 0.4    RADIOGRAPHIC STUDIES: I have personally reviewed the radiological images as listed and agreed with the findings in the report. No results found.  ASSESSMENT & PLAN:   Carcinoma of upper-outer quadrant of right breast in female, estrogen receptor positive ([place]) #Right breast-invasive mammary carcinoma-s/p lumpectomy- STAGE I- TRIPLE POSITIVE; s/p  Herceptin maintenance [May 2022]. On Anastrazole.  Stable. APRIL 2023 [Dr.Cintron] BIL Mamm-Right calcifications attributed to treatment.  Patient tolerating anastrozole well.  #April 2023 incidental left axillary adenopathy- left mammo [? Allergic reaction? Detergent] - AUG 2023- US- WNL>   # Hot flashes- G-1- sec to anastrazole.  Stable  # OSTEOPENIA -BMD [ dec 2021-T-score of -1.2].- on ADJUVANT zometa q 6 M [jan 6th,2022 x3 years;  T-score of -1.3. [to decide]  # SWOG study/PN study- PN- G-0-1 monitor for now. STABLE  # Fatty liver- on [place]- AST/ALT  -grade 1 slightly elevated;exercising/yoga- STABLE.  # port-malfunction today- s/p cathflo-again reviewed/  discussed re: port ex-plantation; will keep for now for zometa/mammogram.    # Disposition: # Zometa  # in 3 month- port flush # follow up in 6 months-   MD;port ;labs- cbc/cmp; vit D 25- OH levels; Zometa; -;Dr.B  All questions were answered. The patient/family knows to call the clinic with any problems, questions or concerns.    Cammie Sickle, MD 05/09/2022 8:26 AM

## 2022-05-06 ENCOUNTER — Ambulatory Visit: Payer: BC Managed Care – PPO | Admitting: Internal Medicine

## 2022-05-06 ENCOUNTER — Ambulatory Visit: Payer: BC Managed Care – PPO

## 2022-05-09 ENCOUNTER — Encounter: Payer: Self-pay | Admitting: Internal Medicine

## 2022-05-19 ENCOUNTER — Other Ambulatory Visit: Payer: Self-pay | Admitting: General Surgery

## 2022-05-19 DIAGNOSIS — N6311 Unspecified lump in the right breast, upper outer quadrant: Secondary | ICD-10-CM | POA: Diagnosis not present

## 2022-05-19 DIAGNOSIS — N644 Mastodynia: Secondary | ICD-10-CM | POA: Diagnosis not present

## 2022-05-19 DIAGNOSIS — C50411 Malignant neoplasm of upper-outer quadrant of right female breast: Secondary | ICD-10-CM | POA: Diagnosis not present

## 2022-05-23 ENCOUNTER — Ambulatory Visit
Admission: RE | Admit: 2022-05-23 | Discharge: 2022-05-23 | Disposition: A | Discharge status: Home / Self Care | Payer: BC Managed Care – PPO | Source: Ambulatory Visit | Attending: General Surgery | Admitting: General Surgery

## 2022-05-23 DIAGNOSIS — N6489 Other specified disorders of breast: Secondary | ICD-10-CM | POA: Diagnosis not present

## 2022-05-23 DIAGNOSIS — Z853 Personal history of malignant neoplasm of breast: Secondary | ICD-10-CM | POA: Diagnosis not present

## 2022-05-23 DIAGNOSIS — N6311 Unspecified lump in the right breast, upper outer quadrant: Secondary | ICD-10-CM | POA: Diagnosis not present

## 2022-05-23 DIAGNOSIS — R928 Other abnormal and inconclusive findings on diagnostic imaging of breast: Secondary | ICD-10-CM | POA: Diagnosis not present

## 2022-06-17 ENCOUNTER — Other Ambulatory Visit: Payer: Self-pay | Admitting: General Surgery

## 2022-06-17 DIAGNOSIS — Z1231 Encounter for screening mammogram for malignant neoplasm of breast: Secondary | ICD-10-CM

## 2022-07-11 ENCOUNTER — Telehealth: Payer: Self-pay | Admitting: Gastroenterology

## 2022-07-11 NOTE — Telephone Encounter (Signed)
Patient calling to schedule repeat colonoscopy. Looks like the patient has seen Dr. Vicente Males and Dr Marius Ditch. Dr. Vicente Males had done her last colonoscopy but the patient most recently saw Dr. Marius Ditch in office.

## 2022-07-11 NOTE — Telephone Encounter (Signed)
Repeat colonoscopy should be with Dr. Marius Ditch since she saw patient in the office

## 2022-07-11 NOTE — Telephone Encounter (Signed)
Returned patients call to schedule her colonoscopy.  Left voice message for her to call me back.  She will be scheduled with Dr. Marius Ditch since she saw Dr. Marius Ditch last for hemorrhoids in 2019.  Thanks, [place], [place]

## 2022-07-12 NOTE — Telephone Encounter (Signed)
Returned patients call to schedule her repeat colonoscopy, however based on 01/20/17 surgical path she is good for 10 years.  Dr. Vicente Males noted, "Notes recorded by Jonathon Bellows, MD on 02/02/2017 at 11:01 AM EDT Polyp was benign- inform repeat colonoscopy in 5 years if has self or family history of colon cancer or polyps- otherwise 10 years".  Patient has no family history of colon cancer or colon polyps.  Thanks,  [place], [place]

## 2022-07-25 ENCOUNTER — Other Ambulatory Visit: Payer: Self-pay | Admitting: Internal Medicine

## 2022-07-29 ENCOUNTER — Inpatient Hospital Stay: Payer: BC Managed Care – PPO | Attending: Internal Medicine

## 2022-07-29 DIAGNOSIS — Z452 Encounter for adjustment and management of vascular access device: Secondary | ICD-10-CM | POA: Diagnosis not present

## 2022-07-29 DIAGNOSIS — Z170 Estrogen receptor positive status [ER+]: Secondary | ICD-10-CM | POA: Insufficient documentation

## 2022-07-29 DIAGNOSIS — Z95828 Presence of other vascular implants and grafts: Secondary | ICD-10-CM

## 2022-07-29 DIAGNOSIS — C50411 Malignant neoplasm of upper-outer quadrant of right female breast: Secondary | ICD-10-CM | POA: Insufficient documentation

## 2022-07-29 MED ORDER — SODIUM CHLORIDE 0.9% FLUSH
10.0000 mL | Freq: Once | INTRAVENOUS | Status: AC
  Administered 2022-07-29: 10 mL via INTRAVENOUS
  Filled 2022-07-29: qty 10

## 2022-07-29 MED ORDER — HEPARIN SOD (PORK) LOCK FLUSH 100 UNIT/ML IV SOLN
500.0000 [IU] | Freq: Once | INTRAVENOUS | Status: AC
  Administered 2022-07-29: 500 [IU] via INTRAVENOUS
  Filled 2022-07-29: qty 5

## 2022-09-06 ENCOUNTER — Telehealth: Payer: Self-pay | Admitting: Medical Oncology

## 2022-09-06 NOTE — Telephone Encounter (Signed)
Z6109 - A Prospective Observational Cohort Study to Develop a Predictive Model of Taxane-Induced Peripheral Neuropathy in Cancer Patients   Outgoing call: [phone]  Call to patient regarding final study follow-up assessment for Week 156.  Spoke with patient regarding the final study assessment questions and inquired if she had some time to answer over the phone today. Patient confirmed she did.  This nurse read over the questionnaires and patient provided her response. All required study questionnaires have been completed for this time point (EORTC QLQ-CIPN20, PRO-CTCAE, FACT-GOG-NTX-4) and patient was informed that there were no more study assessments after this 156 week assessment.   Patient responded that she does have "a little" tingling and numbness in fingers or hands and "quite a bit" of tingling and numbness in toes or feet. Patient confirms that she is having no pain and no difficulty with walking.   Patient thanked for her time today and for her continued support and contribution to the study. Patient denied having any questions at this time. Patient encouraged to call with questions. Patient has now completed study participation.   Rexene Edison, RN, BSN, [facility] Clinical Research Nurse Lead 09/06/2022 3:02 PM

## 2022-10-28 ENCOUNTER — Ambulatory Visit: Payer: BC Managed Care – PPO

## 2022-10-28 ENCOUNTER — Ambulatory Visit: Payer: BC Managed Care – PPO | Admitting: Medical Oncology

## 2022-10-28 ENCOUNTER — Other Ambulatory Visit: Payer: BC Managed Care – PPO

## 2022-11-02 ENCOUNTER — Ambulatory Visit (INDEPENDENT_AMBULATORY_CARE_PROVIDER_SITE_OTHER): Payer: BC Managed Care – PPO | Admitting: Physician Assistant

## 2022-11-02 ENCOUNTER — Ambulatory Visit
Admission: RE | Admit: 2022-11-02 | Discharge: 2022-11-02 | Disposition: A | Discharge status: Home / Self Care | Payer: BC Managed Care – PPO | Source: Ambulatory Visit | Attending: Physician Assistant | Admitting: Physician Assistant

## 2022-11-02 ENCOUNTER — Ambulatory Visit
Admission: RE | Admit: 2022-11-02 | Discharge: 2022-11-02 | Disposition: A | Discharge status: Home / Self Care | Payer: BC Managed Care – PPO | Attending: Physician Assistant | Admitting: Physician Assistant

## 2022-11-02 ENCOUNTER — Other Ambulatory Visit: Payer: Self-pay | Admitting: Physician Assistant

## 2022-11-02 VITALS — BP 129/72 | HR 65 | Ht 66.0 in | Wt 191.1 lb

## 2022-11-02 DIAGNOSIS — C50411 Malignant neoplasm of upper-outer quadrant of right female breast: Secondary | ICD-10-CM | POA: Diagnosis not present

## 2022-11-02 DIAGNOSIS — M25462 Effusion, left knee: Secondary | ICD-10-CM | POA: Diagnosis not present

## 2022-11-02 DIAGNOSIS — M25562 Pain in left knee: Secondary | ICD-10-CM

## 2022-11-02 DIAGNOSIS — M1712 Unilateral primary osteoarthritis, left knee: Secondary | ICD-10-CM | POA: Diagnosis not present

## 2022-11-02 DIAGNOSIS — E0590 Thyrotoxicosis, unspecified without thyrotoxic crisis or storm: Secondary | ICD-10-CM

## 2022-11-02 DIAGNOSIS — Z170 Estrogen receptor positive status [ER+]: Secondary | ICD-10-CM

## 2022-11-02 NOTE — Progress Notes (Signed)
Established patient visit  Patient: Connie Bradford   DOB: 1961-03-18   62 y.o. Female  MRN: [id number] Visit Date: 11/02/2022  Today's healthcare provider: Debera Lat, PA-C   Chief Complaint  Patient presents with   Acute Visit    Left knee x 6 days , no injury , tight pain , discomfort, swelling, having trouble bending it, able to bare weight ,taken ibuprofen  for pain hasn't help, not wear a brace , no x-rays   Subjective     Discussed the use of AI scribe software for clinical note transcription with the patient, who gave verbal consent to proceed.  History of Present Illness   The patient, with a history of breast cancer and osteopenia, presents with a six-day history of left knee swelling and tightness. They deny any injury or trauma to the knee. The swelling is associated with discomfort, particularly when climbing stairs, but they can walk without pain. The knee is warm to touch but not painful. They have been on anastrozole for the past two to three years as part of their breast cancer treatment and receive Zometa infusions every six months. They also take Valtrex for herpes prevention. They deny any history of osteoarthritis, rheumatoid arthritis, or gout.           11/02/2022    1:09 PM 11/02/2022    1:08 PM  Depression screen PHQ 2/9  Decreased Interest 0 0  Down, Depressed, Hopeless 0 0  PHQ - 2 Score 0 0  Altered sleeping 0   Tired, decreased energy 0   Change in appetite 0   Feeling bad or failure about yourself  0   Trouble concentrating 0   Moving slowly or fidgety/restless 0   Suicidal thoughts 0   PHQ-9 Score 0   Difficult doing work/chores Not difficult at all        No data to display          Medications: Outpatient Medications Prior to Visit  Medication Sig   anastrozole (ARIMIDEX) 1 MG tablet Take 1 tablet by mouth once daily   B COMPLEX VITAMINS PO Take 1 tablet by mouth daily.    Cholecalciferol (VITAMIN D) 125 MCG (5000 UT) CAPS Take  5,000 Units by mouth daily.    clobetasol cream (TEMOVATE) 0.05 % Apply 1 application. topically 2 (two) times daily.   COLLAGEN PO Take 1 tablet by mouth daily. Super Collagen w/Biotin   ibuprofen (ADVIL) 200 MG tablet Take 600 mg by mouth every 8 (eight) hours as needed (for pain.).    lidocaine-prilocaine (EMLA) cream Apply 1 application topically as needed.   methimazole (TAPAZOLE) 5 MG tablet Take 5 mg by mouth daily.    Multiple Vitamin (MULTIVITAMIN WITH MINERALS) TABS tablet Take 1 tablet by mouth daily.   valACYclovir (VALTREX) 500 MG tablet Take 1 tablet (500 mg total) by mouth daily.   chlorhexidine (PERIDEX) 0.12 % solution USE IN THE MOUTH OR THROAT TWICE DAILY AS DIRECTED (Patient not taking: Reported on 07/20/2021)   magic mouthwash (lidocaine, diphenhydrAMINE, alum & mag hydroxide) suspension Swish and swallow 5 mLs 4 (four) times daily as needed for mouth pain. (Patient not taking: Reported on 04/29/2022)   Omega 3-6-9 Fatty Acids (OMEGA 3-6-9 COMPLEX) CAPS Take 1 capsule by mouth daily.   ondansetron (ZOFRAN) 8 MG tablet One pill every 8 hours as needed for nausea/vomitting. (Patient not taking: Reported on 04/29/2022)   prochlorperazine (COMPAZINE) 10 MG tablet Take 1 tablet (10 mg total)  by mouth every 6 (six) hours as needed for nausea or vomiting. (Patient not taking: Reported on 04/29/2022)   No facility-administered medications prior to visit.    Review of Systems Except see HPI        Objective    BP 129/72   Pulse 65   Ht 5\' 6"  (1.676 m)   Wt 191 lb 1.6 oz (86.7 kg)   LMP 12/18/2013   SpO2 98%   BMI 30.84 kg/m      Physical Exam   No results found for any visits on 11/02/22.  Assessment & Plan        Left Knee Swelling: Acute onset of swelling and tightness in the left knee over the past six days. No history of trauma or injury. No history of osteoarthritis. Patient has a history of osteopenia and is on anastrozole for breast cancer. Could be  attributed to Anastrozole, zometa vs hyperthyroidism vs undiagnosed osteoarthritis. Physical examination reveals swelling and warmth, with pain on lateral side and tightness on medial side. -Order labs to check for infection and rheumatoid arthritis. -Order knee X-ray to rule out osteoarthritis or other structural abnormalities. -Refer to orthopedics for further evaluation and possible fluid aspiration. -Advise patient to rest the knee, avoid excessive movement, and use cool compresses for swelling.  Breast Cancer: Patient is on anastrozole and receives Zometa infusions every six months. Last infusion was six months ago and the next one is scheduled for the coming Friday. -Continue current treatment regimen.  Hyperthyroidism: Patient has a history of hyperthyroidism but is not currently on any medication for it as the condition has been normal for several years. -No changes to current management.  Herpes Prevention: Patient is on Valtrex for herpes prevention. -Continue current treatment regimen.     No follow-ups on file.     The patient was advised to call back or seek an in-person evaluation if the symptoms worsen or if the condition fails to improve as anticipated.  I discussed the assessment and treatment plan with the patient. The patient was provided an opportunity to ask questions and all were answered. The patient agreed with the plan and demonstrated an understanding of the instructions.  I, Debera Lat, PA-C have reviewed all documentation for this visit. The documentation on  11/03/22 for the exam, diagnosis, procedures, and orders are all accurate and complete.  Debera Lat, [facility], MMS [facility] [phone] (phone) [phone] (fax)  [facility]

## 2022-11-03 ENCOUNTER — Other Ambulatory Visit: Payer: Self-pay

## 2022-11-03 ENCOUNTER — Encounter: Payer: Self-pay | Admitting: Physician Assistant

## 2022-11-03 DIAGNOSIS — Z170 Estrogen receptor positive status [ER+]: Secondary | ICD-10-CM

## 2022-11-03 LAB — CBC WITH DIFFERENTIAL/PLATELET
Basophils Absolute: 0.1 10*3/uL (ref 0.0–0.2)
Basos: 1 %
EOS (ABSOLUTE): 0.2 10*3/uL (ref 0.0–0.4)
Eos: 3 %
Hematocrit: 39.9 % (ref 34.0–46.6)
Hemoglobin: 12.8 g/dL (ref 11.1–15.9)
Immature Grans (Abs): 0 10*3/uL (ref 0.0–0.1)
Immature Granulocytes: 1 %
Lymphocytes Absolute: 1.8 10*3/uL (ref 0.7–3.1)
Lymphs: 28 %
MCH: 29.4 pg (ref 26.6–33.0)
MCHC: 32.1 g/dL (ref 31.5–35.7)
MCV: 92 fL (ref 79–97)
Monocytes Absolute: 0.6 10*3/uL (ref 0.1–0.9)
Monocytes: 9 %
Neutrophils Absolute: 3.7 10*3/uL (ref 1.4–7.0)
Neutrophils: 58 %
Platelets: 191 10*3/uL (ref 150–450)
RBC: 4.36 x10E6/uL (ref 3.77–5.28)
RDW: 13 % (ref 11.7–15.4)
WBC: 6.4 10*3/uL (ref 3.4–10.8)

## 2022-11-03 LAB — SEDIMENTATION RATE: Sed Rate: 9 mm/hr (ref 0–40)

## 2022-11-03 LAB — RHEUMATOID FACTOR: Rheumatoid fact SerPl-aCnc: <10 IU/mL (ref <14.0)

## 2022-11-03 LAB — C-REACTIVE PROTEIN: CRP: 9 mg/L (ref 0–10)

## 2022-11-03 LAB — ANA W/REFLEX IF POSITIVE: Anti Nuclear Antibody (ANA): NEGATIVE

## 2022-11-04 ENCOUNTER — Inpatient Hospital Stay: Payer: BC Managed Care – PPO | Attending: Internal Medicine

## 2022-11-04 ENCOUNTER — Encounter: Payer: Self-pay | Admitting: Medical Oncology

## 2022-11-04 ENCOUNTER — Inpatient Hospital Stay: Payer: BC Managed Care – PPO

## 2022-11-04 ENCOUNTER — Inpatient Hospital Stay (HOSPITAL_BASED_OUTPATIENT_CLINIC_OR_DEPARTMENT_OTHER): Payer: BC Managed Care – PPO | Admitting: Medical Oncology

## 2022-11-04 VITALS — BP 141/79 | HR 64 | Temp 97.5°F | Wt 190.6 lb

## 2022-11-04 DIAGNOSIS — M85852 Other specified disorders of bone density and structure, left thigh: Secondary | ICD-10-CM

## 2022-11-04 DIAGNOSIS — Z170 Estrogen receptor positive status [ER+]: Secondary | ICD-10-CM

## 2022-11-04 DIAGNOSIS — M8580 Other specified disorders of bone density and structure, unspecified site: Secondary | ICD-10-CM | POA: Diagnosis not present

## 2022-11-04 DIAGNOSIS — Z95828 Presence of other vascular implants and grafts: Secondary | ICD-10-CM

## 2022-11-04 DIAGNOSIS — C50411 Malignant neoplasm of upper-outer quadrant of right female breast: Secondary | ICD-10-CM | POA: Diagnosis not present

## 2022-11-04 LAB — COMPREHENSIVE METABOLIC PANEL
ALT: 101 U/L — ABNORMAL HIGH (ref 0–44)
AST: 65 U/L — ABNORMAL HIGH (ref 15–41)
Albumin: 4.2 g/dL (ref 3.5–5.0)
Alkaline Phosphatase: 70 U/L (ref 38–126)
Anion gap: 9 (ref 5–15)
BUN: 12 mg/dL (ref 6–20)
CO2: 24 mmol/L (ref 22–32)
Calcium: 9.4 mg/dL (ref 8.9–10.3)
Chloride: 105 mmol/L (ref 98–111)
Creatinine, Ser: 0.69 mg/dL (ref 0.44–1.00)
GFR, Estimated: >60 mL/min (ref >60)
Glucose, Bld: 160 mg/dL — ABNORMAL HIGH (ref 70–99)
Potassium: 3.6 mmol/L (ref 3.5–5.1)
Sodium: 138 mmol/L (ref 135–145)
Total Bilirubin: 0.5 mg/dL (ref 0.3–1.2)
Total Protein: 6.9 g/dL (ref 6.5–8.1)

## 2022-11-04 MED ORDER — ZOLEDRONIC ACID 4 MG/100ML IV SOLN
4.0000 mg | Freq: Once | INTRAVENOUS | Status: AC
  Administered 2022-11-04: 4 mg via INTRAVENOUS

## 2022-11-04 MED ORDER — SODIUM CHLORIDE 0.9 % IV SOLN
Freq: Once | INTRAVENOUS | Status: AC
  Filled 2022-11-04: qty 250

## 2022-11-04 NOTE — Progress Notes (Signed)
[facility] Office Visit Follow Up Note  Patient Care Team: Malva Limes, MD as PCP - General (Family Medicine) [facility] Gyn (Gynecology) Scarlett Presto, RN (Inactive) as Oncology Nurse Navigator Earna Coder, MD as Consulting Physician (Hematology and Oncology) Earna Coder, MD as Consulting Physician (Internal Medicine) Carmina Miller, MD as Consulting Physician (Radiation Oncology) Carolan Shiver, MD as Consulting Physician (General Surgery)  CHIEF COMPLAINTS/PURPOSE OF CONSULTATION: Breast cancer  #  Oncology History Overview Note  # MARCH 2021- RUOQ- 7mm- [BREAST, RIGHT, LATERAL POSTERIOR  - INVASIVE MAMMARY CARCINOMA, NO SPECIAL TYPE, ASSOCIATED WITH  CALCIFICATIONS.]IMC; ER-> 90%; PR- 50-90%; HER 2 FISH- POSITIVE  # S/P LUMPECTOMY [Dr.Cintron] pT1a [49mm];pN0 [STAGE IA- ER/PRpoS; Her 2 POS]  . BREAST, RIGHT, LATERAL MIDDLE DEPTH (RIBBON-SHAPED CLIP):  STEREOTACTIC-GUIDED CORE BIOPSY: - DUCTAL CARCINOMA IN SITU (DCIS), HIGH-GRADE, ASSOCIATED WITH  CALCIFICATIONS.   # May 14th,2021- TH-H; s/p RT [finished OCT 2021]; NOV 23rd 2021- start Anastrazole; ; s/p  Herceptin maintenance [May 2022].  #Hypothyroidism-methimazole [Dr.Solum]  # SURVIVORSHIP:   # GENETICS:   DIAGNOSIS: Breast cancer  STAGE:   1      ;  GOALS: Cure  CURRENT/MOST RECENT THERAPY : Taxol Herceptin   Carcinoma of upper-outer quadrant of right breast in female, estrogen receptor positive (HCC)  07/15/2019 Initial Diagnosis   Carcinoma of upper-outer quadrant of right breast in female, estrogen receptor positive (HCC)   08/28/2019 - 09/03/2020 Chemotherapy   Patient is on Treatment Plan : BREAST weekly PACLitaxel / trastuzumab / Maintenance trastuzumab every 21 days       HISTORY OF PRESENTING ILLNESS: Alone.  Ambulating independently.  Connie Bradford 62 y.o.  female with breast cancer early stage-ER/PR positive HER-2/neu positive on adjuvant  anastrozole/adjuvant zometa is here for follow-up and consideration of Zometa. We last saw her 6 months ago in January.   Today she reports that she is doing well. She is taking the anastrozole as prescribed. Patient denies concerns or side effects today.   Patient denies any joint pains. denies any significant hot flashes. Denies any chest pain or shortness of breath or cough.  No nausea no vomiting.   Review of Systems  Constitutional:  Negative for chills, diaphoresis, fever, malaise/fatigue and weight loss.  HENT:  Negative for nosebleeds and sore throat.   Eyes:  Negative for double vision.  Respiratory:  Negative for cough, hemoptysis, sputum production, shortness of breath and wheezing.   Cardiovascular:  Negative for chest pain, palpitations, orthopnea and leg swelling.  Gastrointestinal:  Negative for abdominal pain, blood in stool, constipation, diarrhea, heartburn, melena, nausea and vomiting.  Genitourinary:  Negative for dysuria, frequency and urgency.  Musculoskeletal:  Negative for back pain and joint pain.  Skin:  Negative for itching.  Neurological:  Negative for dizziness, focal weakness, weakness and headaches.  Endo/Heme/Allergies:  Does not bruise/bleed easily.  Psychiatric/Behavioral:  Negative for depression. The patient is not nervous/anxious and does not have insomnia.      MEDICAL HISTORY:  Past Medical History:  Diagnosis Date   Atypical mole 09/26/2018   right mid anterior side/mild   Basal cell carcinoma 08/25/2014   right med cheek infraorbital   Breast cancer (HCC) 07/2019   IMC and DCIS   Dyslipidemia    Family history of bladder cancer    Graves disease    Hyperthyroidism    Inborn lipid storage disorder    Leiomyoma of uterus    Personal history of chemotherapy 2021  RIGHT lumpectomy   Personal history of radiation therapy 2021   RIGHT lumpectomy   Vitamin D deficiency     SURGICAL HISTORY: Past Surgical History:  Procedure Laterality  Date   APPENDECTOMY  1982   Cyst removed from ovaries    BREAST BIOPSY Right 07/05/2019   stereo bx, coil clip, [facility]   BREAST BIOPSY Right 07/05/2019   stereo bx, ribbon clip, DCIS    BREAST LUMPECTOMY Right 07/31/2019    [facility] and DCIS with SN bx   COLONOSCOPY WITH PROPOFOL N/A 01/20/2017   Procedure: COLONOSCOPY WITH PROPOFOL;  Surgeon: Wyline Mood, MD;  Location: [facility] ENDOSCOPY;  Service: Gastroenterology;  Laterality: N/A;   LAPAROSCOPIC OVARIAN CYSTECTOMY  1982   NECK SURGERY  2013   LIft from weight loss   PARTIAL MASTECTOMY WITH NEEDLE LOCALIZATION AND AXILLARY SENTINEL LYMPH NODE BX Right 07/31/2019   Procedure: RIGHT PARTIAL MASTECTOMY WITH BRACKETED NEEDLE LOCALIZATION AND LEFT AXILLARY SENTINEL LYMPH NODE BIOPSY;  Surgeon: Carolan Shiver, MD;  Location: [facility] ORS;  Service: General;  Laterality: Right;   PORTACATH PLACEMENT Left 07/31/2019   Procedure: INSERTION PORT-A-CATH LEFT INTERNAL JUGULAR;  Surgeon: Carolan Shiver, MD;  Location: [facility] ORS;  Service: General;  Laterality: Left;   TONSILLECTOMY  1969    SOCIAL HISTORY: Social History   Socioeconomic History   Marital status: Married    Spouse name: Not on file   Number of children: 0   Years of education: Coll Grad   Highest education level: Associate degree: academic program  Occupational History   Occupation: Full-Time    Comment: Scientist, [employer]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 Quit smoking in 2007 [25 years]; ocassional alcohol. 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    Social Determinants of Health   Financial  Resource Strain: Low Risk  (11/02/2022)   Overall Financial Resource Strain (CARDIA)    Difficulty of Paying Living Expenses: Not hard at all  Food Insecurity: No Food Insecurity (11/02/2022)   Hunger Vital Sign    Worried About Running Out of Food in the Last Year: Never true    Ran Out of Food in the Last Year: Never true  Transportation Needs: No Transportation Needs (11/02/2022)   PRAPARE - Administrator, Civil Service (Medical): No    Lack of Transportation (Non-Medical): No  Physical Activity: Sufficiently Active (11/02/2022)   Exercise Vital Sign    Days of Exercise per Week: 5 days    Minutes of Exercise per Session: 40 min  Stress: Stress Concern Present (11/02/2022)   [employer] of Occupational Health - Occupational Stress Questionnaire    Feeling of Stress : To some extent  Social Connections: Moderately Integrated (11/02/2022)   Social Connection and Isolation Panel [NHANES]    Frequency of Communication with Friends and Family: Twice a week    Frequency of Social Gatherings with Friends and Family: Once a week    Attends Religious Services: More than 4 times per year    Active Member of Clubs or Organizations: No  Attends Banker Meetings: Not on file    Marital Status: Married  Intimate Partner Violence: Not At Risk (05/26/2017)   Humiliation, Afraid, Rape, and Kick questionnaire    Fear of Current or Ex-Partner: No    Emotionally Abused: No    Physically Abused: No    Sexually Abused: No    FAMILY HISTORY: Family History  Problem Relation Age of Onset   Bladder Cancer Mother    Diabetes Father    Heart disease Father    Bladder Cancer Brother    Breast cancer Neg Hx     ALLERGIES:  is allergic to penicillins.  MEDICATIONS:  Current Outpatient Medications  Medication Sig Dispense Refill   anastrozole (ARIMIDEX) 1 MG tablet Take 1 tablet by mouth once daily 90 tablet 1   B COMPLEX VITAMINS PO Take 1 tablet by mouth daily.       Cholecalciferol (VITAMIN D) 125 MCG (5000 UT) CAPS Take 5,000 Units by mouth daily.      clobetasol cream (TEMOVATE) 0.05 % Apply 1 application. topically 2 (two) times daily. 60 g 1   COLLAGEN PO Take 1 tablet by mouth daily. Super Collagen w/Biotin     ibuprofen (ADVIL) 200 MG tablet Take 600 mg by mouth every 8 (eight) hours as needed (for pain.).      lidocaine-prilocaine (EMLA) cream Apply 1 application topically as needed. 30 g 0   methimazole (TAPAZOLE) 5 MG tablet Take 5 mg by mouth daily.      Multiple Vitamin (MULTIVITAMIN WITH MINERALS) TABS tablet Take 1 tablet by mouth daily.     Omega 3-6-9 Fatty Acids (OMEGA 3-6-9 COMPLEX) CAPS Take 1 capsule by mouth daily.     valACYclovir (VALTREX) 500 MG tablet Take 1 tablet (500 mg total) by mouth daily. 30 tablet 5   chlorhexidine (PERIDEX) 0.12 % solution USE IN THE MOUTH OR THROAT TWICE DAILY AS DIRECTED (Patient not taking: Reported on 07/20/2021) 473 mL 0   magic mouthwash (lidocaine, diphenhydrAMINE, alum & mag hydroxide) suspension Swish and swallow 5 mLs 4 (four) times daily as needed for mouth pain. (Patient not taking: Reported on 04/29/2022) 360 mL 1   ondansetron (ZOFRAN) 8 MG tablet One pill every 8 hours as needed for nausea/vomitting. (Patient not taking: Reported on 04/29/2022) 40 tablet 1   prochlorperazine (COMPAZINE) 10 MG tablet Take 1 tablet (10 mg total) by mouth every 6 (six) hours as needed for nausea or vomiting. (Patient not taking: Reported on 04/29/2022) 40 tablet 1   No current facility-administered medications for this visit.    PHYSICAL EXAMINATION: ECOG PERFORMANCE STATUS: 0 - Asymptomatic  Vitals:   11/04/22 1313  BP: (!) 141/79  Pulse: 64  Temp: (!) 97.5 F (36.4 C)  SpO2: 99%    Filed Weights   11/04/22 1313  Weight: 190 lb 9.6 oz (86.5 kg)   Physical Exam HENT:     Head: Normocephalic and atraumatic.     Mouth/Throat:     Pharynx: No oropharyngeal exudate.  Eyes:     Pupils: Pupils are  equal, round, and reactive to light.  Cardiovascular:     Rate and Rhythm: Normal rate and regular rhythm.  Pulmonary:     Effort: Pulmonary effort is normal. No respiratory distress.     Breath sounds: Normal breath sounds. No wheezing.  Chest:  Breasts:    Right: No swelling, bleeding, inverted nipple, mass, nipple discharge, skin change or tenderness.     Left: No swelling,  bleeding, inverted nipple, mass, nipple discharge, skin change or tenderness.    Abdominal:     General: Bowel sounds are normal. There is no distension.     Palpations: Abdomen is soft. There is no mass.     Tenderness: There is no abdominal tenderness. There is no guarding or rebound.  Musculoskeletal:        General: No tenderness. Normal range of motion.     Cervical back: Normal range of motion and neck supple.  Lymphadenopathy:     Upper Body:     Right upper body: No supraclavicular, axillary or pectoral adenopathy.     Left upper body: No supraclavicular, axillary or pectoral adenopathy.  Skin:    General: Skin is warm.  Neurological:     Mental Status: She is alert and oriented to person, place, and time.  Psychiatric:        Mood and Affect: Affect normal.    LABORATORY DATA:  I have reviewed the data as listed Lab Results  Component Value Date   WBC 6.4 11/02/2022   HGB 12.8 11/02/2022   HCT 39.9 11/02/2022   MCV 92 11/02/2022   PLT 191 11/02/2022   Recent Labs    04/29/22 1248 11/04/22 1300  NA 137 138  K 3.6 3.6  CL 102 105  CO2 23 24  GLUCOSE 141* 160*  BUN 14 12  CREATININE 0.77 0.69  CALCIUM 9.1 9.4  GFRNONAA >60 >60  PROT 6.8 6.9  ALBUMIN 4.1 4.2  AST 68* 65*  ALT 71* 101*  ALKPHOS 81 70  BILITOT 0.4 0.5   ASSESSMENT & PLAN:   Carcinoma of upper-outer quadrant of right breast in female, estrogen receptor positive (HCC) #Right breast-invasive mammary carcinoma-s/p lumpectomy- STAGE I- TRIPLE POSITIVE; s/p  Herceptin maintenance [May 2022]. On Anastrazole. Last  Mammogram 05/23/2022- Bi-RADS Category 2. Continue Anastrazole at this time. RTC 6 months.    #April 2023 incidental left axillary adenopathy- left mammo [? Allergic reaction? Detergent] - AUG 2023- US- WNL> Will monitor   # Hot flashes- G-1- sec to anastrazole.  Stable   # OSTEOPENIA -BMD [ dec 2021-T-score of -1.2].- on ADJUVANT zometa q 6 M [jan 6th,2022 x3 years;  T-score of -1.3. Calcium 9.4 today- Zometa administered.    # SWOG study/PN study- PN- G-0-1 monitor for now. STABLE   # Fatty liver- on [place]- AST/ALT  -grade 1 slightly elevated;exercising/yoga- STABLE.   # port-malfunction today- s/p cathflo-again reviewed/ discussed re: port ex-plantation; will keep for now for zometa/mammogram.     # Disposition: # Zometa  # in 3 month- port flush # follow up in 6 months-   MD;port ;labs- cbc/cmp; vit D 25- [place] levels; Zometa; [place]  All questions were answered. The patient/family knows to call the clinic with any problems, questions or concerns.    Rushie Chestnut, PA-C 11/04/2022 2:23 PM

## 2022-11-09 ENCOUNTER — Telehealth: Payer: Self-pay | Admitting: *Deleted

## 2022-11-09 ENCOUNTER — Encounter: Payer: Self-pay | Admitting: Internal Medicine

## 2022-11-09 DIAGNOSIS — Z95828 Presence of other vascular implants and grafts: Secondary | ICD-10-CM

## 2022-11-09 DIAGNOSIS — Z170 Estrogen receptor positive status [ER+]: Secondary | ICD-10-CM

## 2022-11-09 NOTE — Telephone Encounter (Signed)
Request for [facility] removal faxed to Dr Hazle Quant at [facility] surgery per patient request.

## 2022-11-22 NOTE — Patient Instructions (Signed)
Preventive Care 40-60 Years Old, Female Preventive care refers to lifestyle choices and visits with your health care provider that can promote health and wellness. Preventive care visits are also called wellness exams. What can I expect for my preventive care visit? Counseling Your health care provider may ask you questions about your: Medical history, including: Past medical problems. Family medical history. Pregnancy history. Current health, including: Menstrual cycle. Method of birth control. Emotional well-being. Home life and relationship well-being. Sexual activity and sexual health. Lifestyle, including: Alcohol, nicotine or tobacco, and drug use. Access to firearms. Diet, exercise, and sleep habits. Work and work environment. Sunscreen use. Safety issues such as seatbelt and bike helmet use. Physical exam Your health care provider will check your: Height and weight. These may be used to calculate your BMI (body mass index). BMI is a measurement that tells if you are at a healthy weight. Waist circumference. This measures the distance around your waistline. This measurement also tells if you are at a healthy weight and may help predict your risk of certain diseases, such as type 2 diabetes and high blood pressure. Heart rate and blood pressure. Body temperature. Skin for abnormal spots. What immunizations do I need?  Vaccines are usually given at various ages, according to a schedule. Your health care provider will recommend vaccines for you based on your age, medical history, and lifestyle or other factors, such as travel or where you work. What tests do I need? Screening Your health care provider may recommend screening tests for certain conditions. This may include: Lipid and cholesterol levels. Diabetes screening. This is done by checking your blood sugar (glucose) after you have not eaten for a while (fasting). Pelvic exam and Pap test. Hepatitis B test. Hepatitis C  test. HIV (human immunodeficiency virus) test. STI (sexually transmitted infection) testing, if you are at risk. Lung cancer screening. Colorectal cancer screening. Mammogram. Talk with your health care provider about when you should start having regular mammograms. This may depend on whether you have a family history of breast cancer. BRCA-related cancer screening. This may be done if you have a family history of breast, ovarian, tubal, or peritoneal cancers. Bone density scan. This is done to screen for osteoporosis. Talk with your health care provider about your test results, treatment options, and if necessary, the need for more tests. Follow these instructions at home: Eating and drinking  Eat a diet that includes fresh fruits and vegetables, whole grains, lean protein, and low-fat dairy products. Take vitamin and mineral supplements as recommended by your health care provider. Do not drink alcohol if: Your health care provider tells you not to drink. You are pregnant, may be pregnant, or are planning to become pregnant. If you drink alcohol: Limit how much you have to 0-1 drink a day. Know how much alcohol is in your drink. In the [place] one drink equals one 12 oz bottle of beer (355 mL), one 5 oz glass of wine (148 mL), or one 1 oz glass of hard liquor (44 mL). Lifestyle Brush your teeth every morning and night with fluoride toothpaste. Floss one time each day. Exercise for at least 30 minutes 5 or more days each week. Do not use any products that contain nicotine or tobacco. These products include cigarettes, chewing tobacco, and vaping devices, such as e-cigarettes. If you need help quitting, ask your health care provider. Do not use drugs. If you are sexually active, practice safe sex. Use a condom or other form of protection to   prevent STIs. If you do not wish to become pregnant, use a form of birth control. If you plan to become pregnant, see your health care provider for a  prepregnancy visit. Take aspirin only as told by your health care provider. Make sure that you understand how much to take and what form to take. Work with your health care provider to find out whether it is safe and beneficial for you to take aspirin daily. Find healthy ways to manage stress, such as: Meditation, yoga, or listening to music. Journaling. Talking to a trusted person. Spending time with friends and family. Minimize exposure to UV radiation to reduce your risk of skin cancer. Safety Always wear your seat belt while driving or riding in a vehicle. Do not drive: If you have been drinking alcohol. Do not ride with someone who has been drinking. When you are tired or distracted. While texting. If you have been using any mind-altering substances or drugs. Wear a helmet and other protective equipment during sports activities. If you have firearms in your house, make sure you follow all gun safety procedures. Seek help if you have been physically or sexually abused. What's next? Visit your health care provider once a year for an annual wellness visit. Ask your health care provider how often you should have your eyes and teeth checked. Stay up to date on all vaccines. This information is not intended to replace advice given to you by your health care provider. Make sure you discuss any questions you have with your health care provider. Document Revised: 09/30/2020 Document Reviewed: 09/30/2020 Elsevier Patient Education  2024 Elsevier Inc. Breast Self-Awareness Breast self-awareness is knowing how your breasts look and feel. You need to: Check your breasts on a regular basis. Tell your doctor about any changes. Become familiar with the look and feel of your breasts. This can help you catch a breast problem while it is still  and can be treated. You should do breast self-exams even if you have breast implants. What you need: A mirror. A well-lit room. A pillow or other  soft object. How to do a breast self-exam Follow these steps to do a breast self-exam: Look for changes  Take off all the clothes above your waist. Stand in front of a mirror in a room with good lighting. Put your hands down at your sides. Compare your breasts in the mirror. Look for any difference between them, such as: A difference in shape. A difference in size. Wrinkles, dips, and bumps in one breast and not the other. Look at each breast for changes in the skin, such as: Redness. Scaly areas. Skin that has gotten thicker. Dimpling. Open sores (ulcers). Look for changes in your nipples, such as: Fluid coming out of a nipple. Fluid around a nipple. Bleeding. Dimpling. Redness. A nipple that looks pushed in (retracted), or that has changed position. Feel for changes Lie on your back. Feel each breast. To do this: Pick a breast to feel. Place a pillow under the shoulder closest to that breast. Put the arm closest to that breast behind your head. Feel the nipple area of that breast using the hand of your other arm. Feel the area with the pads of your three middle fingers by making  circles with your fingers. Use light, medium, and firm pressure. Continue the overlapping circles, moving downward over the breast. Keep making circles with your fingers. Stop when you feel your ribs. Start making circles with your fingers again, this time going   upward until you reach your collarbone. Then, make circles outward across your breast and into your armpit area. Squeeze your nipple. Check for discharge and lumps. Repeat these steps to check your other breast. Sit or stand in the tub or shower. With soapy water on your skin, feel each breast the same way you did when you were lying down. Write down what you find Writing down what you find can help you remember what to tell your doctor. Write down: What is normal for each breast. Any changes you find in each breast. These  include: The kind of changes you find. A tender or painful breast. Any lump you find. Write down its size and where it is. When you last had your monthly period (menstrual cycle). General tips If you are breastfeeding, the best time to check your breasts is after you feed your baby or after you use a breast pump. If you get monthly bleeding, the best time to check your breasts is 5-7 days after your monthly cycle ends. With time, you will become comfortable with the self-exam. You will also start to know if there are changes in your breasts. Contact a doctor if: You see a change in the shape or size of your breasts or nipples. You see a change in the skin of your breast or nipples, such as red or scaly skin. You have fluid coming from your nipples that is not normal. You find a new lump or thick area. You have breast pain. You have any concerns about your breast health. Summary Breast self-awareness includes looking for changes in your breasts and feeling for changes within your breasts. You should do breast self-awareness in front of a mirror in a well-lit room. If you get monthly periods (menstrual cycles), the best time to check your breasts is 5-7 days after your period ends. Tell your doctor about any changes you see in your breasts. Changes include changes in size, changes on the skin, painful or tender breasts, or fluid from your nipples that is not normal. This information is not intended to replace advice given to you by your health care provider. Make sure you discuss any questions you have with your health care provider. Document Revised: 09/09/2021 Document Reviewed: 02/04/2021 Elsevier Patient Education  2024 Elsevier Inc.  

## 2022-11-22 NOTE — Progress Notes (Unsigned)
ANNUAL PREVENTATIVE CARE GYNECOLOGY  ENCOUNTER NOTE  Subjective:       Connie Bradford is a 62 y.o. G0P0000 female here for a routine annual gynecologic exam. The patient {is/is not/has never been:13135} sexually active. The patient {is/is not:13135} taking hormone replacement therapy. {post-men bleed:13152::"Patient denies post-menopausal vaginal bleeding."} The patient wears seatbelts: {yes/no:311178}. The patient participates in regular exercise: {yes/no/not asked:9010}. Has the patient ever been transfused or tattooed?: {yes/no/not asked:9010}. The patient reports that there {is/is not:9024} domestic violence in her life. She was dx with right breats cancer, s/p lumpectomy, chemo, and RT  and is on Arimidex and infusions monthly. She has monitoring mammograms through surgeon and has this next week. .  The patient does perform self breast exams.  There is notable family history of breast or ovarian cancer in her family.    Current complaints: 1.  ***    Gynecologic History Patient's last menstrual period was 12/18/2013. Contraception: post menopausal status Last Pap: 05/23/2019. Results were: normal Last mammogram: 05/23/2022. Results were: normal Last Colonoscopy: 01/20/2017: 5 years Last Dexa Scan: Never Done   Obstetric History OB History  Gravida Para Term Preterm AB Living  0 0 0 0 0 0  SAB IAB Ectopic Multiple Live Births  0 0 0 0 0    Past Medical History:  Diagnosis Date   Atypical mole 09/26/2018   right mid anterior side/mild   Basal cell carcinoma 08/25/2014   right med cheek infraorbital   Breast cancer (HCC) 07/2019   [facility] and DC[facility]   Dyslipidemia    Family history of bladder cancer    Graves disease    Hyperthyroidism    Inborn lipid storage disorder    Leiomyoma of uterus    Personal history of chemotherapy 2021   RIGHT lumpectomy   Personal history of radiation therapy 2021   RIGHT lumpectomy   Vitamin D deficiency     Family History  Problem  Relation Age of Onset   Bladder Cancer Mother    Diabetes Father    Heart disease Father    Bladder Cancer Brother    Breast cancer Neg Hx     Past Surgical History:  Procedure Laterality Date   APPENDECTOMY  1982   Cyst removed from ovaries    BREAST BIOPSY Right 07/05/2019   stereo bx, coil clip, [facility]   BREAST BIOPSY Right 07/05/2019   stereo bx, ribbon clip, [facility]    BREAST LUMPECTOMY Right 07/31/2019    [facility] and DCIS with SN bx   COLONOSCOPY WITH PROPOFOL N/A 01/20/2017   Procedure: COLONOSCOPY WITH PROPOFOL;  Surgeon: Wyline Mood, MD;  Location: [facility] ENDOSCOPY;  Service: Gastroenterology;  Laterality: N/A;   LAPAROSCOPIC OVARIAN CYSTECTOMY  1982   NECK SURGERY  2013   LIft from weight loss   PARTIAL MASTECTOMY WITH NEEDLE LOCALIZATION AND AXILLARY SENTINEL LYMPH NODE BX Right 07/31/2019   Procedure: RIGHT PARTIAL MASTECTOMY WITH BRACKETED NEEDLE LOCALIZATION AND LEFT AXILLARY SENTINEL LYMPH NODE BIOPSY;  Surgeon: Carolan Shiver, MD;  Location: [facility] ORS;  Service: General;  Laterality: Right;   PORTACATH PLACEMENT Left 07/31/2019   Procedure: INSERTION PORT-A-CATH LEFT INTERNAL JUGULAR;  Surgeon: Carolan Shiver, MD;  Location: [facility] ORS;  Service: General;  Laterality: Left;   TONSILLECTOMY  1969    Social History   Socioeconomic History   Marital status: Married    Spouse name: Not on file   Number of children: 0   Years of education: [employer]   [facility] [employer] [facility] education  level: Associate degree: academic program  Occupational History   Occupation: Full-Time    Comment: Scientist, [employer]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 Quit smoking in 2007 [25 years]; ocassional alcohol. 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    Social Determinants of Health   Financial Resource Strain: Low Risk  (11/02/2022)   Overall Financial Resource Strain (CARDIA)    Difficulty of Paying Living Expenses: Not hard at all  Food Insecurity: No Food Insecurity (11/02/2022)   Hunger Vital Sign    Worried About Running Out of Food in the Last Year: Never true    Ran Out of Food in the Last Year: Never true  Transportation Needs: No Transportation Needs (11/02/2022)   PRAPARE - Administrator, Civil Service (Medical): No    Lack of Transportation (Non-Medical): No  Physical Activity: Sufficiently Active (11/02/2022)   Exercise Vital Sign    Days of Exercise per Week: 5 days    Minutes of Exercise per Session: 40 min  Stress: Stress Concern Present (11/02/2022)   [employer] of Occupational Health - Occupational Stress Questionnaire    Feeling of Stress : To some extent  Social Connections: Moderately Integrated (11/02/2022)   Social Connection and Isolation Panel [NHANES]    Frequency of Communication with Friends and Family: Twice a week    Frequency of Social Gatherings with Friends and Family: Once a week    Attends Religious Services: More than 4 times per year    Active Member of [employer] or Organizations: No    Attends Banker Meetings: Not on file    Marital Status: Married  Catering manager Violence: Not At Risk (05/26/2017)   Humiliation, Afraid, Rape, and Kick questionnaire    Fear of Current or Ex-Partner: No    Emotionally Abused: No    Physically Abused: No    Sexually Abused: No    Current Outpatient Medications on File Prior to Visit  Medication Sig Dispense Refill   anastrozole (ARIMIDEX) 1 MG tablet Take 1 tablet by mouth once daily 90 tablet 1   B COMPLEX VITAMINS PO Take 1 tablet by mouth daily.      chlorhexidine (PERIDEX) 0.12 % solution USE IN  THE MOUTH OR THROAT TWICE DAILY AS DIRECTED (Patient not taking: Reported on 07/20/2021) 473 mL 0   Cholecalciferol (VITAMIN D) 125 MCG (5000 UT) CAPS Take 5,000 Units by mouth daily.      clobetasol cream (TEMOVATE) 0.05 % Apply 1 application. topically 2 (two) times daily. 60 g 1   COLLAGEN PO Take 1 tablet by mouth daily. Super Collagen w/Biotin     ibuprofen (ADVIL) 200 MG tablet Take 600 mg by mouth every 8 (eight) hours as needed (for pain.).      lidocaine-prilocaine (EMLA) cream Apply 1 application topically as needed. 30 g 0   magic mouthwash (lidocaine, diphenhydrAMINE, alum & mag hydroxide) suspension Swish and swallow 5 mLs 4 (four) times daily as needed for mouth pain. (Patient not taking: Reported on 04/29/2022) 360 mL  1   methimazole (TAPAZOLE) 5 MG tablet Take 5 mg by mouth daily.      Multiple Vitamin (MULTIVITAMIN WITH MINERALS) TABS tablet Take 1 tablet by mouth daily.     Omega 3-6-9 Fatty Acids (OMEGA 3-6-9 COMPLEX) CAPS Take 1 capsule by mouth daily.     ondansetron (ZOFRAN) 8 MG tablet One pill every 8 hours as needed for nausea/vomitting. (Patient not taking: Reported on 04/29/2022) 40 tablet 1   prochlorperazine (COMPAZINE) 10 MG tablet Take 1 tablet (10 mg total) by mouth every 6 (six) hours as needed for nausea or vomiting. (Patient not taking: Reported on 04/29/2022) 40 tablet 1   valACYclovir (VALTREX) 500 MG tablet Take 1 tablet (500 mg total) by mouth daily. 30 tablet 5   No current facility-administered medications on file prior to visit.    Allergies  Allergen Reactions   Penicillins Rash      Review of Systems ROS Review of Systems - General ROS: negative for - chills, fatigue, fever, hot flashes, night sweats, weight gain or weight loss Psychological ROS: negative for - anxiety, decreased libido, depression, mood swings, physical abuse or sexual abuse Ophthalmic ROS: negative for - blurry vision, eye pain or loss of vision ENT ROS: negative for - headaches,  hearing change, visual changes or vocal changes Allergy and Immunology ROS: negative for - hives, itchy/watery eyes or seasonal allergies Hematological and Lymphatic ROS: negative for - bleeding problems, bruising, swollen lymph nodes or weight loss Endocrine ROS: negative for - galactorrhea, hair pattern changes, hot flashes, malaise/lethargy, mood swings, palpitations, polydipsia/polyuria, skin changes, temperature intolerance or unexpected weight changes Breast ROS: negative for - new or changing breast lumps or nipple discharge Respiratory ROS: negative for - cough or shortness of breath Cardiovascular ROS: negative for - chest pain, irregular heartbeat, palpitations or shortness of breath Gastrointestinal ROS: no abdominal pain, change in bowel habits, or black or bloody stools Genito-Urinary ROS: no dysuria, trouble voiding, or hematuria Musculoskeletal ROS: negative for - joint pain or joint stiffness Neurological ROS: negative for - bowel and bladder control changes Dermatological ROS: negative for rash and skin lesion changes   Objective:   LMP 12/18/2013  CONSTITUTIONAL: Well-developed, well-nourished female in no acute distress.  PSYCHIATRIC: Normal mood and affect. Normal behavior. Normal judgment and thought content. NEUROLGIC: Alert and oriented to person, place, and time. Normal muscle tone coordination. No cranial nerve deficit noted. HENT:  Normocephalic, atraumatic, External right and left ear normal. Oropharynx is clear and moist EYES: Conjunctivae and EOM are normal. Pupils are equal, round, and reactive to light. No scleral icterus.  NECK: Normal range of motion, supple, no masses.  Normal thyroid.  SKIN: Skin is warm and dry. No rash noted. Not diaphoretic. No erythema. No pallor. CARDIOVASCULAR: Normal heart rate noted, regular rhythm, no murmur. RESPIRATORY: Clear to auscultation bilaterally. Effort and breath sounds normal, no problems with respiration  noted. BREASTS: Symmetric in size. No masses, skin changes, nipple drainage, or lymphadenopathy. ABDOMEN: Soft, normal bowel sounds, no distention noted.  No tenderness, rebound or guarding.  BLADDER: Normal PELVIC:  Bladder {:311640}  Urethra: {:311719}  Vulva: {:311722}  Vagina: {:311643}  Cervix: {:311644}  Uterus: {:311718}  Adnexa: {:311645}  RV: {Blank multiple:19196::"External Exam NormaI","No Rectal Masses","Normal Sphincter tone"}  MUSCULOSKELETAL: Normal range of motion. No tenderness.  No cyanosis, clubbing, or edema.  2+ distal pulses. LYMPHATIC: No Axillary, Supraclavicular, or Inguinal Adenopathy.   Labs: Lab Results  Component Value Date   WBC 6.4 11/02/2022  HGB 12.8 11/02/2022   HCT 39.9 11/02/2022   MCV 92 11/02/2022   PLT 191 11/02/2022    Lab Results  Component Value Date   CREATININE 0.69 11/04/2022   BUN 12 11/04/2022   NA 138 11/04/2022   K 3.6 11/04/2022   CL 105 11/04/2022   CO2 24 11/04/2022    Lab Results  Component Value Date   ALT 101 (H) 11/04/2022   AST 65 (H) 11/04/2022   ALKPHOS 70 11/04/2022   BILITOT 0.5 11/04/2022    Lab Results  Component Value Date   CHOL 235 (H) 07/20/2021   HDL 63 07/20/2021   LDLCALC 138 (H) 07/20/2021   TRIG 192 (H) 07/20/2021   CHOLHDL 3.7 07/20/2021    Lab Results  Component Value Date   TSH 1.820 07/20/2021    Lab Results  Component Value Date   HGBA1C 6.6 (H) 07/20/2021     Assessment:   1. Well woman exam with routine gynecological exam      Plan:  Pap: Pap, Reflex if ASCUS Mammogram:  UTD Colon Screening:   Overdue Labs:  UTD Routine preventative health maintenance measures emphasized: Exercise/Diet/Weight control and Self Breast Exams. COVID Vaccination status: Return to Clinic - 1 Year   Hildred Laser, MD [facility]

## 2022-11-23 ENCOUNTER — Encounter: Payer: Self-pay | Admitting: Obstetrics and Gynecology

## 2022-11-23 ENCOUNTER — Other Ambulatory Visit (HOSPITAL_COMMUNITY)
Admission: RE | Admit: 2022-11-23 | Discharge: 2022-11-23 | Disposition: A | Discharge status: Home / Self Care | Payer: BC Managed Care – PPO | Source: Ambulatory Visit | Attending: Obstetrics and Gynecology | Admitting: Obstetrics and Gynecology

## 2022-11-23 ENCOUNTER — Ambulatory Visit (INDEPENDENT_AMBULATORY_CARE_PROVIDER_SITE_OTHER): Payer: BC Managed Care – PPO | Admitting: Obstetrics and Gynecology

## 2022-11-23 VITALS — BP 169/99 | HR 67 | Resp 16 | Ht 66.0 in | Wt 181.9 lb

## 2022-11-23 DIAGNOSIS — C50411 Malignant neoplasm of upper-outer quadrant of right female breast: Secondary | ICD-10-CM

## 2022-11-23 DIAGNOSIS — Z79811 Long term (current) use of aromatase inhibitors: Secondary | ICD-10-CM

## 2022-11-23 DIAGNOSIS — Z01419 Encounter for gynecological examination (general) (routine) without abnormal findings: Secondary | ICD-10-CM | POA: Diagnosis not present

## 2022-11-23 DIAGNOSIS — E785 Hyperlipidemia, unspecified: Secondary | ICD-10-CM

## 2022-11-23 DIAGNOSIS — Z124 Encounter for screening for malignant neoplasm of cervix: Secondary | ICD-10-CM | POA: Insufficient documentation

## 2022-11-23 DIAGNOSIS — Z1211 Encounter for screening for malignant neoplasm of colon: Secondary | ICD-10-CM

## 2022-11-23 DIAGNOSIS — R7303 Prediabetes: Secondary | ICD-10-CM

## 2022-11-25 ENCOUNTER — Telehealth: Payer: Self-pay

## 2022-11-25 NOTE — Telephone Encounter (Signed)
Per 07/11/22 chart note  Returned patients call to schedule her repeat colonoscopy, however based on 01/20/17 surgical path she is good for 10 years.  Dr. Tobi Bastos noted, "Notes recorded by Wyline Mood, MD on 02/02/2017 at 11:01 AM EDT Polyp was benign- inform repeat colonoscopy in 5 years if has self or family history of colon cancer or polyps- otherwise 10 years".   Patient has no family history of colon cancer or colon polyps.   Thanks,   [place], [place]

## 2022-11-29 ENCOUNTER — Encounter: Payer: Self-pay | Admitting: Family Medicine

## 2022-11-29 ENCOUNTER — Ambulatory Visit (INDEPENDENT_AMBULATORY_CARE_PROVIDER_SITE_OTHER): Payer: BC Managed Care – PPO | Admitting: Family Medicine

## 2022-11-29 VITALS — BP 139/72 | HR 70 | Temp 97.8°F | Resp 16 | Ht 66.0 in | Wt 182.7 lb

## 2022-11-29 DIAGNOSIS — E0590 Thyrotoxicosis, unspecified without thyrotoxic crisis or storm: Secondary | ICD-10-CM | POA: Diagnosis not present

## 2022-11-29 DIAGNOSIS — E119 Type 2 diabetes mellitus without complications: Secondary | ICD-10-CM | POA: Diagnosis not present

## 2022-11-29 MED ORDER — ONETOUCH ULTRA VI STRP
ORAL_STRIP | 4 refills | Status: AC
Start: 2022-11-29 — End: ?

## 2022-11-29 MED ORDER — ONETOUCH ULTRA 2 W/DEVICE KIT
PACK | 0 refills | Status: AC
Start: 2022-11-29 — End: ?

## 2022-11-29 NOTE — Progress Notes (Signed)
Established patient visit   Patient: Connie Bradford   DOB: 05-Sep-1960   62 y.o. Female  MRN: [id number] Visit Date: 11/29/2022  Today's healthcare provider: Mila Merry, MD   Chief Complaint  Patient presents with   Medical Management of Chronic Issues    Patient here to fu on labs done by Dr. Valentino Saxon on 11/23/2022.    Subjective    Discussed the use of AI scribe software for clinical note transcription with the patient, who gave verbal consent to proceed.  History of Present Illness   The patient presents for follow up diabetes, having had recent a1c checked by gyn of 8/8, up from 6.6 last year.  up to 8.6. She reports no new health problems or changes in medication or diet that could account for this increase. The patient's diet is not really following a low carb diet. They have a strong family history of diabetes with three brothers having type 2, and her father had type.  The patient recently experienced a rapid weight loss, which she attributes to a bout of illness following a cruise. She describes symptoms consistent with a respiratory infection, including lack of appetite, which lasted for several weeks. This illness occurred approximately a month prior to the consultation.  The patient also has a history of hypothyroidism, previously followed by Dr. Tedd Sias, but has not had  thyroid levels checked in about a year. They are currently taking a vitamin D supplement. The patient's cholesterol levels are also reported to be slightly elevated.       Medications: Outpatient Medications Prior to Visit  Medication Sig   anastrozole (ARIMIDEX) 1 MG tablet Take 1 tablet by mouth once daily   B COMPLEX VITAMINS PO Take 1 tablet by mouth daily.    chlorhexidine (PERIDEX) 0.12 % solution USE IN THE MOUTH OR THROAT TWICE DAILY AS DIRECTED   Cholecalciferol (VITAMIN D) 125 MCG (5000 UT) CAPS Take 5,000 Units by mouth daily.    clobetasol cream (TEMOVATE) 0.05 % Apply 1 application.  topically 2 (two) times daily.   COLLAGEN PO Take 1 tablet by mouth daily. Super Collagen w/Biotin   ibuprofen (ADVIL) 200 MG tablet Take 600 mg by mouth every 8 (eight) hours as needed (for pain.).    lidocaine-prilocaine (EMLA) cream Apply 1 application topically as needed.   magic mouthwash (lidocaine, diphenhydrAMINE, alum & mag hydroxide) suspension Swish and swallow 5 mLs 4 (four) times daily as needed for mouth pain.   methimazole (TAPAZOLE) 5 MG tablet Take 5 mg by mouth daily.    Multiple Vitamin (MULTIVITAMIN WITH MINERALS) TABS tablet Take 1 tablet by mouth daily.   Omega 3-6-9 Fatty Acids (OMEGA 3-6-9 COMPLEX) CAPS Take 1 capsule by mouth daily.   ondansetron (ZOFRAN) 8 MG tablet One pill every 8 hours as needed for nausea/vomitting.   prochlorperazine (COMPAZINE) 10 MG tablet Take 1 tablet (10 mg total) by mouth every 6 (six) hours as needed for nausea or vomiting.   valACYclovir (VALTREX) 500 MG tablet Take 1 tablet (500 mg total) by mouth daily.   No facility-administered medications prior to visit.   Review of Systems     Objective    BP 139/72 (BP Location: Left Arm, Patient Position: Sitting, Cuff Size: Large)   Pulse 70   Temp 97.8 F (36.6 C) (Temporal)   Resp 16   Ht 5\' 6"  (1.676 m)   Wt 182 lb 11.2 oz (82.9 kg)   LMP 12/18/2013  SpO2 99%   BMI 29.49 kg/m   Physical Exam  Physical Exam        No results found for any visits on 11/29/22.  Assessment & Plan     Assessment and Plan    Diabetes Mellitus A1c increased to 8.8 over the past year. Family history of diabetes. Recent weight loss due to illness. No new medications or dietary changes. -Order metabolic panel to assess overall health status. -Check thyroid function, as it can influence metabolism and blood sugar levels. -Refer to lifestyle clinic for diabetic education. -Prescribe glucose meter and instruct to check fasting blood sugars daily. -Discuss potential need for Metformin if A1c remains  elevated.  Hyperlipidemia Elevated cholesterol noted. -Consider lifestyle modifications and potential need for statin therapy.  Thyroid Disorder Last thyroid check approximately a year ago. -Check thyroid function to ensure stability.          Mila Merry, MD  [facility] [phone] (phone) [phone] (fax)  [facility]

## 2022-12-01 ENCOUNTER — Other Ambulatory Visit: Payer: Self-pay | Admitting: Family Medicine

## 2022-12-01 DIAGNOSIS — E119 Type 2 diabetes mellitus without complications: Secondary | ICD-10-CM

## 2022-12-01 MED ORDER — METFORMIN HCL ER 500 MG PO TB24
500.0000 mg | ORAL_TABLET | Freq: Every day | ORAL | 3 refills | Status: DC
Start: 2022-12-01 — End: 2023-03-21

## 2022-12-02 DIAGNOSIS — Z95828 Presence of other vascular implants and grafts: Secondary | ICD-10-CM | POA: Diagnosis not present

## 2023-01-04 ENCOUNTER — Ambulatory Visit (INDEPENDENT_AMBULATORY_CARE_PROVIDER_SITE_OTHER): Payer: BC Managed Care – PPO | Admitting: Dermatology

## 2023-01-04 ENCOUNTER — Encounter: Payer: Self-pay | Admitting: Dermatology

## 2023-01-04 DIAGNOSIS — Z85828 Personal history of other malignant neoplasm of skin: Secondary | ICD-10-CM

## 2023-01-04 DIAGNOSIS — K130 Diseases of lips: Secondary | ICD-10-CM

## 2023-01-04 DIAGNOSIS — L578 Other skin changes due to chronic exposure to nonionizing radiation: Secondary | ICD-10-CM

## 2023-01-04 DIAGNOSIS — W908XXA Exposure to other nonionizing radiation, initial encounter: Secondary | ICD-10-CM

## 2023-01-04 DIAGNOSIS — D1801 Hemangioma of skin and subcutaneous tissue: Secondary | ICD-10-CM

## 2023-01-04 DIAGNOSIS — Z7189 Other specified counseling: Secondary | ICD-10-CM

## 2023-01-04 DIAGNOSIS — L814 Other melanin hyperpigmentation: Secondary | ICD-10-CM

## 2023-01-04 DIAGNOSIS — D229 Melanocytic nevi, unspecified: Secondary | ICD-10-CM

## 2023-01-04 DIAGNOSIS — Z1283 Encounter for screening for malignant neoplasm of skin: Secondary | ICD-10-CM

## 2023-01-04 DIAGNOSIS — Z86018 Personal history of other benign neoplasm: Secondary | ICD-10-CM

## 2023-01-04 DIAGNOSIS — Z79899 Other long term (current) drug therapy: Secondary | ICD-10-CM

## 2023-01-04 DIAGNOSIS — L821 Other seborrheic keratosis: Secondary | ICD-10-CM

## 2023-01-04 DIAGNOSIS — Z853 Personal history of malignant neoplasm of breast: Secondary | ICD-10-CM

## 2023-01-04 MED ORDER — EUCRISA 2 % EX OINT: TOPICAL_OINTMENT | CUTANEOUS | 1 refills | Status: AC

## 2023-01-04 NOTE — Patient Instructions (Addendum)
Eucrisa ointment apply twice daily to corners of mouth as needed.    Recommend daily broad spectrum sunscreen SPF 30+ to sun-exposed areas, reapply every 2 hours as needed. Call for new or changing lesions.  Staying in the shade or wearing long sleeves, sun glasses (UVA+UVB protection) and wide brim hats (4-inch brim around the entire circumference of the hat) are also recommended for sun protection.    Melanoma ABCDEs  Melanoma is the most dangerous type of skin cancer, and is the leading cause of death from skin disease.  You are more likely to develop melanoma if you: Have light-colored skin, light-colored eyes, or red or blond hair Spend a lot of time in the sun Tan regularly, either outdoors or in a tanning bed Have had blistering sunburns, especially during childhood Have a close family member who has had a melanoma Have atypical moles or large birthmarks  Early detection of melanoma is key since treatment is typically straightforward and cure rates are extremely high if we catch it early.   The first sign of melanoma is often a change in a mole or a new dark spot.  The ABCDE system is a way of remembering the signs of melanoma.  A for asymmetry:  The two halves do not match. B for border:  The edges of the growth are irregular. C for color:  A mixture of colors are present instead of an even brown color. D for diameter:  Melanomas are usually (but not always) greater than 6mm - the size of a pencil eraser. E for evolution:  The spot keeps changing in size, shape, and color.  Please check your skin once per month between visits. You can use a [place] mirror in front and a large mirror behind you to keep an eye on the back side or your body.   If you see any new or changing lesions before your next follow-up, please call to schedule a visit.  Please continue daily skin protection including broad spectrum sunscreen SPF 30+ to sun-exposed areas, reapplying every 2 hours as needed when  you're outdoors.   Staying in the shade or wearing long sleeves, sun glasses (UVA+UVB protection) and wide brim hats (4-inch brim around the entire circumference of the hat) are also recommended for sun protection.    Due to recent changes in healthcare laws, you may see results of your pathology and/or laboratory studies on MyChart before the doctors have had a chance to review them. We understand that in some cases there may be results that are confusing or concerning to you. Please understand that not all results are received at the same time and often the doctors may need to interpret multiple results in order to provide you with the best plan of care or course of treatment. Therefore, we ask that you please give [place] 2 business days to thoroughly review all your results before contacting the office for clarification. Should we see a critical lab result, you will be contacted sooner.   If You Need Anything After Your Visit  If you have any questions or concerns for your doctor, please call our main line at [phone] and press option 4 to reach your doctor's medical assistant. If no one answers, please leave a voicemail as directed and we will return your call as soon as possible. Messages left after 4 pm will be answered the following business day.   You may also send [place] a message via MyChart. We typically respond to MyChart messages within  1-2 business days.  For prescription refills, please ask your pharmacy to contact our office. Our fax number is [phone].  If you have an urgent issue when the clinic is closed that cannot wait until the next business day, you can page your doctor at the number below.    Please note that while we do our best to be available for urgent issues outside of office hours, we are not available 24/7.   If you have an urgent issue and are unable to reach [place], you may choose to seek medical care at your doctor's office, retail clinic, urgent care center, or  emergency room.  If you have a medical emergency, please immediately call 911 or go to the emergency department.  Pager Numbers  - Dr. Gwen Pounds: [phone]  - Dr. Roseanne Reno: [phone]  - Dr. Katrinka Blazing: [phone]   In the event of inclement weather, please call our main line at [phone] for an update on the status of any delays or closures.  Dermatology Medication Tips: Please keep the boxes that topical medications come in in order to help keep track of the instructions about where and how to use these. Pharmacies typically print the medication instructions only on the boxes and not directly on the medication tubes.   If your medication is too expensive, please contact our office at [phone] option 4 or send [place] a message through MyChart.   We are unable to tell what your co-pay for medications will be in advance as this is different depending on your insurance coverage. However, we may be able to find a substitute medication at lower cost or fill out paperwork to get insurance to cover a needed medication.   If a prior authorization is required to get your medication covered by your insurance company, please allow [place] 1-2 business days to complete this process.  Drug prices often vary depending on where the prescription is filled and some pharmacies may offer cheaper prices.  The website www.goodrx.com contains coupons for medications through different pharmacies. The prices here do not account for what the cost may be with help from insurance (it may be cheaper with your insurance), but the website can give you the price if you did not use any insurance.  - You can print the associated coupon and take it with your prescription to the pharmacy.  - You may also stop by our office during regular business hours and pick up a GoodRx coupon card.  - If you need your prescription sent electronically to a different pharmacy, notify our office through [facility] or by phone at  [phone] option 4.     Si Usted Necesita Algo Despus de Su Visita  Tambin puede enviarnos un mensaje a travs de Clinical cytogeneticist. Por lo general respondemos a los mensajes de MyChart en el transcurso de 1 a 2 das hbiles.  Para renovar recetas, por favor pida a su farmacia que se ponga en contacto con nuestra oficina. Annie Sable de fax es [place] [phone].  Si tiene un asunto urgente cuando la clnica est cerrada y que no puede esperar hasta el siguiente da hbil, puede llamar/localizar a su doctor(a) al nmero que aparece a continuacin.   Por favor, tenga en cuenta que aunque hacemos todo lo posible para estar disponibles para asuntos urgentes fuera del horario de [place], no estamos disponibles las 24 horas del da, los 7 [address]  [address] de la [place].   Si tiene un problema urgente y no puede comunicarse con nosotros, puede optar por  buscar atencin mdica  en el consultorio de su doctor(a), en una clnica privada, en un centro de atencin urgente o en una sala de emergencias.  Si tiene Engineer, drilling, por favor llame inmediatamente al 911 o vaya a la sala de emergencias.  Nmeros de bper  - Dr. Gwen Pounds: [phone]  - Dra. Roseanne Reno: [phone]  - Dr. Katrinka Blazing: [phone]   En caso de inclemencias del tiempo, por favor llame a Lacy Duverney principal al [phone] para una actualizacin sobre el [place] de cualquier retraso o cierre.  Consejos para la medicacin en dermatologa: Por favor, guarde las cajas en las que vienen los medicamentos de uso tpico para ayudarle a seguir las instrucciones sobre dnde y cmo usarlos. Las farmacias generalmente imprimen las instrucciones del medicamento slo en las cajas y no directamente en los tubos del [place].   Si su medicamento es muy caro, por favor, pngase en contacto con Rolm Gala llamando al [phone] y presione la opcin 4 o envenos un mensaje a travs de Clinical cytogeneticist.   No podemos decirle cul ser su copago por los  medicamentos por adelantado ya que esto es diferente dependiendo de la cobertura de su seguro. Sin embargo, es posible que podamos encontrar un medicamento sustituto a Audiological scientist un formulario para que el seguro cubra el medicamento que se considera necesario.   Si se requiere una autorizacin previa para que su compaa de seguros [place] su medicamento, por favor permtanos de 1 a 2 das hbiles para completar [address]  Los precios de los medicamentos varan con frecuencia dependiendo del Environmental consultant de dnde se surte la receta y alguna farmacias pueden ofrecer precios ms baratos.  El sitio web www.goodrx.com tiene cupones para medicamentos de Health and safety inspector. Los precios aqu no tienen en cuenta lo que podra costar con la ayuda del seguro (puede ser ms barato con su seguro), pero el sitio web puede darle el precio si no utiliz Tourist information centre manager.  - Puede imprimir el cupn correspondiente y llevarlo con su receta a la farmacia.  - Tambin puede pasar por nuestra oficina durante el horario de atencin regular y Education officer, museum una tarjeta de cupones de GoodRx.  - Si necesita que su receta se enve electrnicamente a una farmacia diferente, informe a nuestra oficina a travs de MyChart de [place] o por telfono llamando al [phone] y presione la opcin 4.

## 2023-01-04 NOTE — Progress Notes (Signed)
   Follow-Up Visit   Subjective  Connie Bradford is a 62 y.o. female who presents for the following: Skin Cancer Screening and Full Body Skin Exam. Hx BCC, Hx dysplastic nevus. Check spot at right cheek. Brown, raised, rough.   Check rash at corners of mouth. Dur: 6 weeks. Used OTC antibiotic cream, not helping but not worsening. Itches, stings, burns.   The patient presents for Total-Body Skin Exam (TBSE) for skin cancer screening and mole check. The patient has spots, moles and lesions to be evaluated, some may be new or changing and the patient may have concern these could be cancer.    The following portions of the chart were reviewed this encounter and updated as appropriate: medications, allergies, medical history  Review of Systems:  No other skin or systemic complaints except as noted in HPI or Assessment and Plan.  Objective  Well appearing patient in no apparent distress; mood and affect are within normal limits.  A full examination was performed including scalp, head, eyes, ears, nose, lips, neck, chest, axillae, abdomen, back, buttocks, bilateral upper extremities, bilateral lower extremities, hands, feet, fingers, toes, fingernails, and toenails. All findings within normal limits unless otherwise noted below.   Relevant physical exam findings are noted in the Assessment and Plan.    Assessment & Plan   History of breast cancer. Right breast.  - No lymphadenopathy   HISTORY OF BASAL CELL CARCINOMA OF THE SKIN. Right med cheek infraorbital, 08/25/2014 - No evidence of recurrence today - Recommend regular full body skin exams - Recommend daily broad spectrum sunscreen SPF 30+ to sun-exposed areas, reapply every 2 hours as needed.  - Call if any new or changing lesions are noted between office visits  HISTORY OF DYSPLASTIC NEVUS. right mid anterior side/mild 09/26/2018 No evidence of recurrence today Recommend regular full body skin exams Recommend daily broad spectrum  sunscreen SPF 30+ to sun-exposed areas, reapply every 2 hours as needed.  Call if any new or changing lesions are noted between office visits   SKIN CANCER SCREENING PERFORMED TODAY.  ACTINIC DAMAGE - Chronic condition, secondary to cumulative UV/sun exposure - diffuse scaly erythematous macules with underlying dyspigmentation - Recommend daily broad spectrum sunscreen SPF 30+ to sun-exposed areas, reapply every 2 hours as needed.  - Staying in the shade or wearing long sleeves, sun glasses (UVA+UVB protection) and wide brim hats (4-inch brim around the entire circumference of the hat) are also recommended for sun protection.  - Call for new or changing lesions.  LENTIGINES, SEBORRHEIC KERATOSES, HEMANGIOMAS - Benign normal skin lesions - Benign-appearing - Call for any changes  MELANOCYTIC NEVI - Tan-brown and/or pink-flesh-colored symmetric macules and papules - Benign appearing on exam today - Observation - Call clinic for new or changing moles - Recommend daily use of broad spectrum spf 30+ sunscreen to sun-exposed areas.   Cheilitis with eczematous changes secondary to licking  Exam: Erythema at oral commissures  Treatment Plan: Start Eucrisa ointment twice daily.  Samples given.  SEBORRHEIC KERATOSIS - Stuck-on, waxy, tan-brown papule at right cheek - Benign-appearing - Discussed benign etiology and prognosis. - Observe - Call for any changes    Return in about 1 year (around 01/04/2024) for TBSE.  I, Lawson Radar, CMA, am acting as scribe for Armida Sans, MD.   Documentation: I have reviewed the above documentation for accuracy and completeness, and I agree with the above.  Armida Sans, MD

## 2023-01-06 ENCOUNTER — Encounter: Payer: Self-pay | Admitting: Dermatology

## 2023-03-03 ENCOUNTER — Ambulatory Visit (INDEPENDENT_AMBULATORY_CARE_PROVIDER_SITE_OTHER): Payer: BC Managed Care – PPO | Admitting: Family Medicine

## 2023-03-03 ENCOUNTER — Other Ambulatory Visit: Payer: Self-pay | Admitting: Family Medicine

## 2023-03-03 ENCOUNTER — Encounter: Payer: Self-pay | Admitting: Family Medicine

## 2023-03-03 VITALS — BP 137/70 | HR 64 | Ht 66.0 in | Wt 184.0 lb

## 2023-03-03 DIAGNOSIS — K130 Diseases of lips: Secondary | ICD-10-CM

## 2023-03-03 DIAGNOSIS — E119 Type 2 diabetes mellitus without complications: Secondary | ICD-10-CM | POA: Diagnosis not present

## 2023-03-03 DIAGNOSIS — Z7984 Long term (current) use of oral hypoglycemic drugs: Secondary | ICD-10-CM | POA: Diagnosis not present

## 2023-03-03 LAB — POCT GLYCOSYLATED HEMOGLOBIN (HGB A1C): Hemoglobin A1C: 6.7 % — AB (ref 4.0–5.6)

## 2023-03-03 MED ORDER — KETOCONAZOLE 2 % EX CREA: 1.0000 | TOPICAL_CREAM | Freq: Two times a day (BID) | CUTANEOUS | 1 refills | Status: DC

## 2023-03-03 NOTE — Progress Notes (Signed)
Established patient visit   Patient: Connie Bradford   DOB: 10-26-1960   62 y.o. Female  MRN: [id number] Visit Date: 03/03/2023  Today's healthcare provider: Mila Merry, MD   Chief Complaint  Patient presents with   Follow-up   Diabetes   Subjective    Discussed the use of AI scribe software for clinical note transcription with the patient, who gave verbal consent to proceed.  History of Present Illness   The patient presents for follow up of type 2 diabetes. She was last seen three months ago with an A1c of 8.8%. and started on metformin, with no reported side effects except for a recent rash around the angles of her mouth. The patient has a history of skin problems and is currently managing the rash with over-the-counter antibiotic and steroid creams, with variable success. She has also tried an over-the-counter antifungal cream and a prescribed anti-inflammatory cream from a dermatologist, neither of which resolved the issue.  Regarding her diabetes management, the patient reports adherence to the prescribed metformin and has made significant dietary changes, including avoiding sugars, sweets, and unhealthy snacks. She also engages in regular physical activity, including walking four to five times a week and occasional gym visits for cardio and aerobics. Despite these efforts, the patient notes a morning spike in her glucose levels, which she monitors regularly.       Medications: Outpatient Medications Prior to Visit  Medication Sig   anastrozole (ARIMIDEX) 1 MG tablet Take 1 tablet by mouth once daily   B COMPLEX VITAMINS PO Take 1 tablet by mouth daily.    Blood Glucose Monitoring Suppl (ONE TOUCH ULTRA 2) w/Device KIT Use to check blood sugar daily for type 2 diabetes E11.9   chlorhexidine (PERIDEX) 0.12 % solution USE IN THE MOUTH OR THROAT TWICE DAILY AS DIRECTED   Cholecalciferol (VITAMIN D) 125 MCG (5000 UT) CAPS Take 5,000 Units by mouth daily.    clobetasol  cream (TEMOVATE) 0.05 % Apply 1 application. topically 2 (two) times daily.   COLLAGEN PO Take 1 tablet by mouth daily. Super Collagen w/Biotin   Crisaborole (EUCRISA) 2 % OINT Apply twice daily to affected areas at lips.   glucose blood (ONETOUCH ULTRA) test strip Use to check blood sugar daily for type 2 diabetes E11.9   ibuprofen (ADVIL) 200 MG tablet Take 600 mg by mouth every 8 (eight) hours as needed (for pain.).    lidocaine-prilocaine (EMLA) cream Apply 1 application topically as needed.   magic mouthwash (lidocaine, diphenhydrAMINE, alum & mag hydroxide) suspension Swish and swallow 5 mLs 4 (four) times daily as needed for mouth pain.   metFORMIN (GLUCOPHAGE-XR) 500 MG 24 hr tablet Take 1 tablet (500 mg total) by mouth daily with breakfast.   methimazole (TAPAZOLE) 5 MG tablet Take 5 mg by mouth daily.    Multiple Vitamin (MULTIVITAMIN WITH MINERALS) TABS tablet Take 1 tablet by mouth daily.   Omega 3-6-9 Fatty Acids (OMEGA 3-6-9 COMPLEX) CAPS Take 1 capsule by mouth daily.   ondansetron (ZOFRAN) 8 MG tablet One pill every 8 hours as needed for nausea/vomitting.   prochlorperazine (COMPAZINE) 10 MG tablet Take 1 tablet (10 mg total) by mouth every 6 (six) hours as needed for nausea or vomiting.   valACYclovir (VALTREX) 500 MG tablet Take 1 tablet (500 mg total) by mouth daily.   No facility-administered medications prior to visit.   Review of Systems     Objective    BP 137/70 (  BP Location: Left Arm, Patient Position: Sitting, Cuff Size: Normal)   Pulse 64   Ht 5\' 6"  (1.676 m)   Wt 184 lb (83.5 kg)   LMP 12/18/2013   SpO2 97%   BMI 29.70 kg/m   Physical Exam  Mld inflammation both angles of mouth.     Results for orders placed or performed in visit on 03/03/23  POCT HgB A1C  Result Value Ref Range   Hemoglobin A1C 6.7 (A) 4.0 - 5.6 %    Assessment & Plan        Type 2 Diabetes Mellitus A1c improved from 8.8% to 6.7% after starting Metformin 500mg  daily. No  gastrointestinal side effects reported. Morning glucose readings are higher, likely due to dawn phenomenon. -Continue Metformin 500mg  daily. -Change prescription to 90-day supply. -Recheck A1c in 3-4 months.  Angular cheilitis New onset since starting metformin, Over-the-counter creams and prescribed Crisaborole have not been effective. -Try Ketoconazole cream. -Consider holding metformin if rash does not improve, but patient prefers to prioritize diabetes control.  Follow-up appointment scheduled for March 2025.    Return in about 4 months (around 07/01/2023) for Diabetes.      Mila Merry, MD  [facility] [phone] (phone) [phone] (fax)  [facility]

## 2023-03-03 NOTE — Patient Instructions (Signed)
.   Please review the attached list of medications and notify my office if there are any errors.   . Please bring all of your medications to every appointment so we can make sure that our medication list is the same as yours.   

## 2023-03-15 ENCOUNTER — Ambulatory Visit: Payer: BC Managed Care – PPO | Admitting: Radiation Oncology

## 2023-03-20 ENCOUNTER — Encounter: Payer: Self-pay | Admitting: Radiation Oncology

## 2023-03-20 ENCOUNTER — Ambulatory Visit
Admission: RE | Admit: 2023-03-20 | Discharge: 2023-03-20 | Disposition: A | Discharge status: Home / Self Care | Payer: BC Managed Care – PPO | Source: Ambulatory Visit | Attending: Radiation Oncology | Admitting: Radiation Oncology

## 2023-03-20 VITALS — Temp 97.8°F | Resp 14 | Ht 66.0 in | Wt 183.7 lb

## 2023-03-20 DIAGNOSIS — C50411 Malignant neoplasm of upper-outer quadrant of right female breast: Secondary | ICD-10-CM | POA: Diagnosis present

## 2023-03-20 DIAGNOSIS — E119 Type 2 diabetes mellitus without complications: Secondary | ICD-10-CM | POA: Diagnosis not present

## 2023-03-20 DIAGNOSIS — Z170 Estrogen receptor positive status [ER+]: Secondary | ICD-10-CM | POA: Diagnosis not present

## 2023-03-20 DIAGNOSIS — Z923 Personal history of irradiation: Secondary | ICD-10-CM | POA: Diagnosis not present

## 2023-03-20 DIAGNOSIS — Z79811 Long term (current) use of aromatase inhibitors: Secondary | ICD-10-CM | POA: Diagnosis not present

## 2023-03-20 NOTE — Progress Notes (Signed)
Radiation Oncology Follow up Note  Name: Connie Bradford   Date:   03/20/2023 MRN:  [id number] DOB: 11-10-1960    This 62 y.o. female presents to the clinic today for 3-year follow-up status post whole breast radiation to her right breast for stage Ia triple positive invasive mammary carcinoma.  REFERRING PROVIDER: Malva Limes, MD  HPI: The patient, a 63 year old with a history of stage 1A triple positive invasive mammary carcinoma in the right breast, presents for a three-year follow-up after undergoing whole breast radiation. She has been on Arimidex for the same duration. The patient reports experiencing hair loss, which she believes is a side effect of the medication. She describes the hair loss as similar to when she underwent chemotherapy, with hair falling out from the root. The patient has been on Arimidex for three years and has not reported any other side effects. She has not been on any other medication during this period. The patient was diagnosed with diabetes a few months ago, but all other tests, including thyroid function, were normal. The patient's mammograms in February were BI-RADS 2 benign..  Which I have reviewed  COMPLICATIONS OF TREATMENT: none  FOLLOW UP COMPLIANCE: keeps appointments   PHYSICAL EXAM:  Temp 97.8 F (36.6 C)   Resp 14   Ht 5\' 6"  (1.676 m)   Wt 183 lb 11.2 oz (83.3 kg)   LMP 12/18/2013   BMI 29.65 kg/m  Lungs are clear to A&P cardiac examination essentially unremarkable with regular rate and rhythm. No dominant mass or nodularity is noted in either breast in 2 positions examined. Incision is well-healed. No axillary or supraclavicular adenopathy is appreciated. Cosmetic result is excellent.  Well-developed well-nourished patient in NAD. HEENT reveals PERLA, EOMI, discs not visualized.  Oral cavity is clear. No oral mucosal lesions are identified. Neck is clear without evidence of cervical or supraclavicular adenopathy. Lungs are clear to A&P. Cardiac  examination is essentially unremarkable with regular rate and rhythm without murmur rub or thrill. Abdomen is benign with no organomegaly or masses noted. Motor sensory and DTR levels are equal and symmetric in the upper and lower extremities. Cranial nerves II through XII are grossly intact. Proprioception is intact. No peripheral adenopathy or edema is identified. No motor or sensory levels are noted. Crude visual fields are within normal range.  RADIOLOGY RESULTS: Mammograms reviewed compatible with above-stated findings RADIOLOGY Mammogram: BI-RADS 2 benign (05/2022)  PLAN: Stage 1A Triple Positive Invasive Mammary Carcinoma 3 years post whole breast radiation, currently on Arimidex. Recent mammograms in February were BI-RADS 2 benign. -Continue current management with Arimidex. -Transfer follow-up care to Dr. Leonard Schwartz. -Discuss hair loss concern with oncologist in January.  Hair Loss Recent onset, potentially related to Arimidex use. -Discuss with oncologist during January appointment. -Consider temporary cessation of Arimidex to assess if hair loss improves.    Carmina Miller, MD

## 2023-03-21 ENCOUNTER — Other Ambulatory Visit: Payer: Self-pay | Admitting: Family Medicine

## 2023-03-21 DIAGNOSIS — E119 Type 2 diabetes mellitus without complications: Secondary | ICD-10-CM

## 2023-03-21 MED ORDER — METFORMIN HCL ER 500 MG PO TB24
500.0000 mg | ORAL_TABLET | Freq: Every day | ORAL | 3 refills | Status: DC
Start: 2023-03-21 — End: 2024-02-18

## 2023-03-25 ENCOUNTER — Other Ambulatory Visit: Payer: Self-pay | Admitting: Internal Medicine

## 2023-03-30 ENCOUNTER — Ambulatory Visit: Payer: BC Managed Care – PPO | Admitting: Radiation Oncology

## 2023-04-06 ENCOUNTER — Encounter: Payer: Self-pay | Admitting: Internal Medicine

## 2023-04-06 ENCOUNTER — Other Ambulatory Visit: Payer: Self-pay | Admitting: General Surgery

## 2023-04-06 DIAGNOSIS — Z1231 Encounter for screening mammogram for malignant neoplasm of breast: Secondary | ICD-10-CM

## 2023-05-09 ENCOUNTER — Ambulatory Visit: Payer: BC Managed Care – PPO | Admitting: Internal Medicine

## 2023-05-09 ENCOUNTER — Ambulatory Visit: Payer: BC Managed Care – PPO

## 2023-05-09 ENCOUNTER — Other Ambulatory Visit: Payer: BC Managed Care – PPO

## 2023-05-10 ENCOUNTER — Inpatient Hospital Stay: Payer: BC Managed Care – PPO

## 2023-05-10 ENCOUNTER — Inpatient Hospital Stay: Payer: BC Managed Care – PPO | Admitting: Internal Medicine

## 2023-05-11 ENCOUNTER — Other Ambulatory Visit: Payer: Self-pay | Admitting: *Deleted

## 2023-05-11 DIAGNOSIS — C50411 Malignant neoplasm of upper-outer quadrant of right female breast: Secondary | ICD-10-CM

## 2023-05-11 DIAGNOSIS — M85852 Other specified disorders of bone density and structure, left thigh: Secondary | ICD-10-CM

## 2023-05-12 ENCOUNTER — Inpatient Hospital Stay: Payer: BC Managed Care – PPO

## 2023-05-12 ENCOUNTER — Inpatient Hospital Stay (HOSPITAL_BASED_OUTPATIENT_CLINIC_OR_DEPARTMENT_OTHER): Payer: BC Managed Care – PPO | Admitting: Internal Medicine

## 2023-05-12 ENCOUNTER — Inpatient Hospital Stay: Payer: BC Managed Care – PPO | Attending: Internal Medicine

## 2023-05-12 ENCOUNTER — Encounter: Payer: Self-pay | Admitting: Internal Medicine

## 2023-05-12 VITALS — BP 131/58 | HR 71 | Temp 96.8°F | Ht 66.0 in | Wt 186.0 lb

## 2023-05-12 DIAGNOSIS — C50411 Malignant neoplasm of upper-outer quadrant of right female breast: Secondary | ICD-10-CM

## 2023-05-12 DIAGNOSIS — Z1721 Progesterone receptor positive status: Secondary | ICD-10-CM | POA: Diagnosis not present

## 2023-05-12 DIAGNOSIS — Z170 Estrogen receptor positive status [ER+]: Secondary | ICD-10-CM

## 2023-05-12 DIAGNOSIS — Z1731 Human epidermal growth factor receptor 2 positive status: Secondary | ICD-10-CM | POA: Diagnosis not present

## 2023-05-12 DIAGNOSIS — M8580 Other specified disorders of bone density and structure, unspecified site: Secondary | ICD-10-CM | POA: Insufficient documentation

## 2023-05-12 DIAGNOSIS — M85852 Other specified disorders of bone density and structure, left thigh: Secondary | ICD-10-CM

## 2023-05-12 LAB — CMP (CANCER CENTER ONLY)
ALT: 62 U/L — ABNORMAL HIGH (ref 0–44)
AST: 43 U/L — ABNORMAL HIGH (ref 15–41)
Albumin: 4.3 g/dL (ref 3.5–5.0)
Alkaline Phosphatase: 68 U/L (ref 38–126)
Anion gap: 8 (ref 5–15)
BUN: 15 mg/dL (ref 6–20)
CO2: 25 mmol/L (ref 22–32)
Calcium: 9.4 mg/dL (ref 8.9–10.3)
Chloride: 105 mmol/L (ref 98–111)
Creatinine: 0.67 mg/dL (ref 0.44–1.00)
GFR, Estimated: >60 mL/min (ref >60)
Glucose, Bld: 214 mg/dL — ABNORMAL HIGH (ref 70–99)
Potassium: 4.3 mmol/L (ref 3.5–5.1)
Sodium: 138 mmol/L (ref 135–145)
Total Bilirubin: 0.8 mg/dL (ref 0.0–1.2)
Total Protein: 6.9 g/dL (ref 6.5–8.1)

## 2023-05-12 LAB — CBC WITH DIFFERENTIAL (CANCER CENTER ONLY)
Abs Immature Granulocytes: 0.05 10*3/uL (ref 0.00–0.07)
Basophils Absolute: 0.1 10*3/uL (ref 0.0–0.1)
Basophils Relative: 2 %
Eosinophils Absolute: 0.2 10*3/uL (ref 0.0–0.5)
Eosinophils Relative: 4 %
HCT: 41 % (ref 36.0–46.0)
Hemoglobin: 13.3 g/dL (ref 12.0–15.0)
Immature Granulocytes: 1 %
Lymphocytes Relative: 26 %
Lymphs Abs: 1.6 10*3/uL (ref 0.7–4.0)
MCH: 29.6 pg (ref 26.0–34.0)
MCHC: 32.4 g/dL (ref 30.0–36.0)
MCV: 91.1 fL (ref 80.0–100.0)
Monocytes Absolute: 0.6 10*3/uL (ref 0.1–1.0)
Monocytes Relative: 10 %
Neutro Abs: 3.6 10*3/uL (ref 1.7–7.7)
Neutrophils Relative %: 57 %
Platelet Count: 217 10*3/uL (ref 150–400)
RBC: 4.5 MIL/uL (ref 3.87–5.11)
RDW: 12.9 % (ref 11.5–15.5)
WBC Count: 6.2 10*3/uL (ref 4.0–10.5)
nRBC: 0 % (ref 0.0–0.2)

## 2023-05-12 LAB — VITAMIN D 25 HYDROXY (VIT D DEFICIENCY, FRACTURES): Vit D, 25-Hydroxy: 35.54 ng/mL (ref 30–100)

## 2023-05-12 MED ORDER — SODIUM CHLORIDE 0.9 % IV SOLN
Freq: Once | INTRAVENOUS | Status: AC
  Filled 2023-05-12: qty 250

## 2023-05-12 MED ORDER — ZOLEDRONIC ACID 4 MG/100ML IV SOLN
4.0000 mg | Freq: Once | INTRAVENOUS | Status: AC
  Administered 2023-05-12: 4 mg via INTRAVENOUS
  Filled 2023-05-12: qty 100

## 2023-05-12 NOTE — Assessment & Plan Note (Addendum)
#  Right breast-invasive mammary carcinoma-s/p lumpectomy- STAGE I- TRIPLE POSITIVE; On Anastrozole since NOV 2021.   Stable. Discussed re: duration of Endocrine therapy 5 vs 10 years. Continue for a minimum of 5 for now.   # FEB 2024 [Dr.Cintron] BIL Mamm- WNL-   Patient tolerating anastrozole well.awaiting next month.   # Hot flashes- G-1- sec to anastrazole. stable.  # Chelitis/ hair loss- unlikely from Anastrozole. Monitor for now- defer to derm/PCP.   # OSTEOPENIA -BMD  on ADJUVANT zometa q 6 M [jan 6th,2022 x3 years; DEC 2023- T-score of -1.3.; proceed with LAST zometa today-  stable.  # SWOG study/PN study- PN- G-0-1 monitor for now.  stable.  # Fatty liver- on [place]- AST/ALT  -grade 1 slightly elevated;--  stable.   # Diabetes- FBG- 212 [New Dx]- on metformin- recommend weight loss; and follow up with PCP.   # IV acess: PIV s/p port explantion  # Disposition: # Zometa today # follow up in 6 months- MD;;labs- cbc/cmp; vit D 25- OH levels; ;Dr.B

## 2023-05-12 NOTE — Progress Notes (Signed)
C/o rash around mouth, concerned it may be from a medication. Red, raw, stings and crusty. Has seem dermatology and received rx's but didn't help.  Pt had port taken out since last visit.  C/o hair falling out over the last six months.

## 2023-05-12 NOTE — Progress Notes (Signed)
[facility] CONSULT NOTE  Patient Care Team: Malva Limes, MD as PCP - General (Family Medicine) [facility] Gyn (Gynecology) Scarlett Presto, RN (Inactive) as Oncology Nurse Navigator Earna Coder, MD as Consulting Physician (Hematology and Oncology) Earna Coder, MD as Consulting Physician (Internal Medicine) Carmina Miller, MD as Consulting Physician (Radiation Oncology) Carolan Shiver, MD as Consulting Physician (General Surgery)  CHIEF COMPLAINTS/PURPOSE OF CONSULTATION: Breast cancer  #  Oncology History Overview Note  # MARCH 2021- RUOQ- 7mm- [BREAST, RIGHT, LATERAL POSTERIOR  - INVASIVE MAMMARY CARCINOMA, NO SPECIAL TYPE, ASSOCIATED WITH  CALCIFICATIONS.]IMC; ER-> 90%; PR- 50-90%; HER 2 FISH- POSITIVE  # S/P LUMPECTOMY [Dr.Cintron] pT1a [71mm];pN0 [STAGE IA- ER/PRpoS; Her 2 POS]  . BREAST, RIGHT, LATERAL MIDDLE DEPTH (RIBBON-SHAPED CLIP):  STEREOTACTIC-GUIDED CORE BIOPSY: - DUCTAL CARCINOMA IN SITU (DCIS), HIGH-GRADE, ASSOCIATED WITH  CALCIFICATIONS.   # May 14th,2021- TH-H; s/p RT [finished OCT 2021]; NOV 23rd 2021- start Anastrazole; ; s/p  Herceptin maintenance [May 2022].  #Hypothyroidism-methimazole [Dr.Solum]  # SURVIVORSHIP:   # GENETICS:   DIAGNOSIS: Breast cancer  STAGE:   1      ;  GOALS: Cure  CURRENT/MOST RECENT THERAPY : Taxol Herceptin   Carcinoma of upper-outer quadrant of right breast in female, estrogen receptor positive (HCC)  07/15/2019 Initial Diagnosis   Carcinoma of upper-outer quadrant of right breast in female, estrogen receptor positive (HCC)   08/28/2019 - 09/03/2020 Chemotherapy   Patient is on Treatment Plan : BREAST weekly PACLitaxel / trastuzumab / Maintenance trastuzumab every 21 days       HISTORY OF PRESENTING ILLNESS: Alone.  Ambulating independently.  Connie Bradford 63 y.o.  female with breast cancer early stage-ER/PR positive HER-2/neu positive on adjuvant anastrozole/adjuvant zometa is  here for follow-up.  Patient notes to have have rash around mouth. Has seem dermatology and received rx's but didn't help.  C/o hair falling out over the last six month   In the interm, Pt had port taken out since last visit.  Patient denies any joint pains. Denies any significant hot flashes. Denies any chest pain or shortness of breath or cough.  No nausea no vomiting.  Patient admits to compliance with her anastrozole.  Review of Systems  Constitutional:  Positive for malaise/fatigue. Negative for chills, diaphoresis, fever and weight loss.  HENT:  Negative for nosebleeds and sore throat.   Eyes:  Negative for double vision.  Respiratory:  Negative for cough, hemoptysis, sputum production, shortness of breath and wheezing.   Cardiovascular:  Positive for leg swelling. Negative for chest pain, palpitations and orthopnea.  Gastrointestinal:  Negative for abdominal pain, blood in stool, constipation, diarrhea, heartburn, melena, nausea and vomiting.  Genitourinary:  Negative for dysuria, frequency and urgency.  Musculoskeletal:  Negative for back pain and joint pain.  Skin:  Negative for itching.  Neurological:  Negative for dizziness, focal weakness, weakness and headaches.  Endo/Heme/Allergies:  Does not bruise/bleed easily.  Psychiatric/Behavioral:  Negative for depression. The patient is not nervous/anxious and does not have insomnia.      MEDICAL HISTORY:  Past Medical History:  Diagnosis Date   Atypical mole 09/26/2018   right mid anterior side/mild   Basal cell carcinoma 08/25/2014   right med cheek infraorbital   Breast cancer (HCC) 07/2019   IMC and DCIS   Dyslipidemia    Family history of bladder cancer    Graves disease    Hyperthyroidism    Inborn lipid storage disorder    Leiomyoma  of uterus    Personal history of chemotherapy 2021   RIGHT lumpectomy   Personal history of radiation therapy 2021   RIGHT lumpectomy   Vitamin D deficiency     SURGICAL  HISTORY: Past Surgical History:  Procedure Laterality Date   APPENDECTOMY  1982   Cyst removed from ovaries    BREAST BIOPSY Right 07/05/2019   stereo bx, coil clip, [facility]   BREAST BIOPSY Right 07/05/2019   stereo bx, ribbon clip, DCIS    BREAST LUMPECTOMY Right 07/31/2019    [facility] and DCIS with SN bx   COLONOSCOPY WITH PROPOFOL N/A 01/20/2017   Procedure: COLONOSCOPY WITH PROPOFOL;  Surgeon: Wyline Mood, MD;  Location: [facility] ENDOSCOPY;  Service: Gastroenterology;  Laterality: N/A;   LAPAROSCOPIC OVARIAN CYSTECTOMY  1982   NECK SURGERY  2013   LIft from weight loss   PARTIAL MASTECTOMY WITH NEEDLE LOCALIZATION AND AXILLARY SENTINEL LYMPH NODE BX Right 07/31/2019   Procedure: RIGHT PARTIAL MASTECTOMY WITH BRACKETED NEEDLE LOCALIZATION AND LEFT AXILLARY SENTINEL LYMPH NODE BIOPSY;  Surgeon: Carolan Shiver, MD;  Location: [facility] ORS;  Service: General;  Laterality: Right;   PORTACATH PLACEMENT Left 07/31/2019   Procedure: INSERTION PORT-A-CATH LEFT INTERNAL JUGULAR;  Surgeon: Carolan Shiver, MD;  Location: [facility] ORS;  Service: General;  Laterality: Left;   TONSILLECTOMY  1969    SOCIAL HISTORY: Social History   Socioeconomic History   Marital status: Married    Spouse name: Not on file   Number of children: 0   Years of education: Coll Grad   Highest education level: Associate degree: academic program  Occupational History   Occupation: Full-Time    Comment: Scientist, [employer]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 Quit smoking in 2007 [25 years]; ocassional alcohol. 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    Social Drivers of Corporate investment banker Strain: Low Risk  (03/02/2023)   Overall Financial Resource Strain (CARDIA)    Difficulty of Paying Living Expenses: Not hard at all  Food Insecurity: No Food Insecurity (03/02/2023)   Hunger Vital Sign    Worried About Running Out of Food in the Last Year: Never true    Ran Out of Food in the Last Year: Never true  Transportation Needs: No Transportation Needs (03/02/2023)   PRAPARE - Administrator, Civil Service (Medical): No    Lack of Transportation (Non-Medical): No  Physical Activity: Sufficiently Active (03/02/2023)   Exercise Vital Sign    Days of Exercise per Week: 4 days    Minutes of Exercise per Session: 40 min  Stress: No Stress Concern Present (03/02/2023)   [employer] of Occupational Health - Occupational Stress Questionnaire    Feeling of Stress : Only a little  Social Connections: Moderately Integrated (03/02/2023)   Social Connection and Isolation Panel [NHANES]    Frequency of Communication with Friends and Family: Three times a week    Frequency of Social Gatherings with Friends and Family: Three times a week    Attends Religious Services: More than 4  times per year    Active Member of Clubs or Organizations: No    Attends Banker Meetings: Not on file    Marital Status: Married  Intimate Partner Violence: Not At Risk (05/26/2017)   Humiliation, Afraid, Rape, and Kick questionnaire    Fear of Current or Ex-Partner: No    Emotionally Abused: No    Physically Abused: No    Sexually Abused: No    FAMILY HISTORY: Family History  Problem Relation Age of Onset   Bladder Cancer Mother    Diabetes Father    Heart disease Father    Bladder Cancer Brother    Diabetes Mellitus II Brother    Diabetes Mellitus II Brother    Diabetes Mellitus II Brother    Breast cancer Neg Hx     ALLERGIES:  is allergic to penicillins.  MEDICATIONS:  Current Outpatient Medications   Medication Sig Dispense Refill   anastrozole (ARIMIDEX) 1 MG tablet Take 1 tablet by mouth once daily 90 tablet 0   B COMPLEX VITAMINS PO Take 1 tablet by mouth daily.      Blood Glucose Monitoring Suppl (ONE TOUCH ULTRA 2) w/Device KIT Use to check blood sugar daily for type 2 diabetes E11.9 1 kit 0   Cholecalciferol (VITAMIN D) 125 MCG (5000 UT) CAPS Take 5,000 Units by mouth daily.      clobetasol cream (TEMOVATE) 0.05 % Apply 1 application. topically 2 (two) times daily. 60 g 1   COLLAGEN PO Take 1 tablet by mouth daily. Super Collagen w/Biotin     Crisaborole (EUCRISA) 2 % OINT Apply twice daily to affected areas at lips. 60 g 1   glucose blood (ONETOUCH ULTRA) test strip Use to check blood sugar daily for type 2 diabetes E11.9 100 strip 4   ibuprofen (ADVIL) 200 MG tablet Take 600 mg by mouth every 8 (eight) hours as needed (for pain.).      ketoconazole (NIZORAL) 2 % cream Apply 1 gram to corners of mouth twice a day 15 g 1   metFORMIN (GLUCOPHAGE-XR) 500 MG 24 hr tablet Take 1 tablet (500 mg total) by mouth daily with breakfast. 90 tablet 3   Multiple Vitamin (MULTIVITAMIN WITH MINERALS) TABS tablet Take 1 tablet by mouth daily.     Omega 3-6-9 Fatty Acids (OMEGA 3-6-9 COMPLEX) CAPS Take 1 capsule by mouth daily.     ondansetron (ZOFRAN) 8 MG tablet One pill every 8 hours as needed for nausea/vomitting. 40 tablet 1   prochlorperazine (COMPAZINE) 10 MG tablet Take 1 tablet (10 mg total) by mouth every 6 (six) hours as needed for nausea or vomiting. 40 tablet 1   valACYclovir (VALTREX) 500 MG tablet Take 1 tablet (500 mg total) by mouth daily. 30 tablet 5   No current facility-administered medications for this visit.    PHYSICAL EXAMINATION: ECOG PERFORMANCE STATUS: 0 - Asymptomatic  Vitals:   05/12/23 0812  BP: (!) 131/58  Pulse: 71  Temp: (!) 96.8 F (36 C)  SpO2: 100%    Filed Weights   05/12/23 0812  Weight: 186 lb (84.4 kg)     Physical Exam HENT:     Head:  Normocephalic and atraumatic.     Mouth/Throat:     Pharynx: No oropharyngeal exudate.  Eyes:     Pupils: Pupils are equal, round, and reactive to light.  Cardiovascular:     Rate and Rhythm: Normal rate and regular rhythm.  Pulmonary:     Effort: Pulmonary effort  is normal. No respiratory distress.     Breath sounds: Normal breath sounds. No wheezing.  Abdominal:     General: Bowel sounds are normal. There is no distension.     Palpations: Abdomen is soft. There is no mass.     Tenderness: There is no abdominal tenderness. There is no guarding or rebound.  Musculoskeletal:        General: No tenderness. Normal range of motion.     Cervical back: Normal range of motion and neck supple.  Skin:    General: Skin is warm.  Neurological:     Mental Status: She is alert and oriented to person, place, and time.  Psychiatric:        Mood and Affect: Affect normal.    LABORATORY DATA:  I have reviewed the data as listed Lab Results  Component Value Date   WBC 6.2 05/12/2023   HGB 13.3 05/12/2023   HCT 41.0 05/12/2023   MCV 91.1 05/12/2023   PLT 217 05/12/2023   Recent Labs    11/04/22 1300 11/29/22 0935 05/12/23 0753  NA 138 143 138  K 3.6 4.4 4.3  CL 105 104 105  CO2 24 23 25   GLUCOSE 160* 174* 214*  BUN 12 14 15   CREATININE 0.69 0.93 0.67  CALCIUM 9.4 10.2 9.4  GFRNONAA >60  --  >60  PROT 6.9 6.9 6.9  ALBUMIN 4.2 4.6 4.3  AST 65* 59* 43*  ALT 101* 74* 62*  ALKPHOS 70 87 68  BILITOT 0.5 0.5 0.8    RADIOGRAPHIC STUDIES: I have personally reviewed the radiological images as listed and agreed with the findings in the report. No results found.  ASSESSMENT & PLAN:   Carcinoma of upper-outer quadrant of right breast in female, estrogen receptor positive (HCC) #Right breast-invasive mammary carcinoma-s/p lumpectomy- STAGE I- TRIPLE POSITIVE; On Anastrozole since NOV 2021.   Stable. Discussed re: duration of Endocrine therapy 5 vs 10 years. Continue for a minimum of 5  for now.   # FEB 2024 [Dr.Cintron] BIL Mamm- WNL-   Patient tolerating anastrozole well.awaiting next month.   # Hot flashes- G-1- sec to anastrazole. stable.  # Chelitis/ hair loss- unlikely from Anastrozole. Monitor for now- defer to derm/PCP.   # OSTEOPENIA -BMD  on ADJUVANT zometa q 6 M [jan 6th,2022 x3 years; DEC 2023- T-score of -1.3.; proceed with LAST zometa today-  stable.  # SWOG study/PN study- PN- G-0-1 monitor for now.  stable.  # Fatty liver- on [place]- AST/ALT  -grade 1 slightly elevated;--  stable.   # Diabetes- FBG- 212 [New Dx]- on metformin- recommend weight loss; and follow up with PCP.   # IV acess: PIV s/p port explantion  # Disposition: # Zometa today # follow up in 6 months- MD;;labs- cbc/cmp; vit D 25- OH levels; ;Dr.B  All questions were answered. The patient/family knows to call the clinic with any problems, questions or concerns.    Earna Coder, MD 05/12/2023 9:12 AM

## 2023-05-29 ENCOUNTER — Ambulatory Visit
Admission: RE | Admit: 2023-05-29 | Discharge: 2023-05-29 | Disposition: A | Discharge status: Home / Self Care | Payer: BC Managed Care – PPO | Source: Ambulatory Visit | Attending: General Surgery | Admitting: General Surgery

## 2023-05-29 DIAGNOSIS — Z1231 Encounter for screening mammogram for malignant neoplasm of breast: Secondary | ICD-10-CM | POA: Insufficient documentation

## 2023-06-02 DIAGNOSIS — C50411 Malignant neoplasm of upper-outer quadrant of right female breast: Secondary | ICD-10-CM | POA: Diagnosis not present

## 2023-07-03 ENCOUNTER — Other Ambulatory Visit: Payer: Self-pay | Admitting: Internal Medicine

## 2023-07-03 ENCOUNTER — Encounter: Payer: Self-pay | Admitting: Family Medicine

## 2023-07-03 ENCOUNTER — Ambulatory Visit: Payer: BC Managed Care – PPO | Admitting: Family Medicine

## 2023-07-03 VITALS — BP 144/70 | HR 67 | Resp 16 | Wt 185.5 lb

## 2023-07-03 DIAGNOSIS — Z7984 Long term (current) use of oral hypoglycemic drugs: Secondary | ICD-10-CM | POA: Diagnosis not present

## 2023-07-03 DIAGNOSIS — E1169 Type 2 diabetes mellitus with other specified complication: Secondary | ICD-10-CM

## 2023-07-03 DIAGNOSIS — E119 Type 2 diabetes mellitus without complications: Secondary | ICD-10-CM

## 2023-07-03 LAB — POCT GLYCOSYLATED HEMOGLOBIN (HGB A1C)
Est. average glucose Bld gHb Est-mCnc: 186
Hemoglobin A1C: 8.1 % — AB (ref 4.0–5.6)

## 2023-07-03 MED ORDER — TIRZEPATIDE 2.5 MG/0.5ML ~~LOC~~ SOAJ: 2.5000 mg | SUBCUTANEOUS | 0 refills | Status: DC

## 2023-07-03 NOTE — Patient Instructions (Signed)
 Marland Kitchen  Please review the attached list of medications and notify my office if there are any errors.   . Please bring all of your medications to every appointment so we can make sure that our medication list is the same as yours.

## 2023-07-03 NOTE — Progress Notes (Signed)
 Marland Kitchen

## 2023-07-04 LAB — SPECIMEN STATUS REPORT

## 2023-07-04 LAB — MICROALBUMIN / CREATININE URINE RATIO
Creatinine, Urine: 64.6 mg/dL
Microalb/Creat Ratio: 5 mg/g{creat} (ref 0–29)
Microalbumin, Urine: 3.2 ug/mL

## 2023-07-26 ENCOUNTER — Other Ambulatory Visit: Payer: Self-pay | Admitting: Family Medicine

## 2023-07-26 DIAGNOSIS — E119 Type 2 diabetes mellitus without complications: Secondary | ICD-10-CM

## 2023-07-27 ENCOUNTER — Encounter: Payer: Self-pay | Admitting: Family Medicine

## 2023-07-27 MED ORDER — TIRZEPATIDE 5 MG/0.5ML ~~LOC~~ SOAJ: 5.0000 mg | SUBCUTANEOUS | 2 refills | Status: DC

## 2023-08-14 DIAGNOSIS — H2513 Age-related nuclear cataract, bilateral: Secondary | ICD-10-CM | POA: Diagnosis not present

## 2023-08-14 DIAGNOSIS — E119 Type 2 diabetes mellitus without complications: Secondary | ICD-10-CM | POA: Diagnosis not present

## 2023-08-14 DIAGNOSIS — H43813 Vitreous degeneration, bilateral: Secondary | ICD-10-CM | POA: Diagnosis not present

## 2023-08-14 LAB — HM DIABETES EYE EXAM

## 2023-08-26 ENCOUNTER — Encounter: Payer: Self-pay | Admitting: Family Medicine

## 2023-09-13 ENCOUNTER — Other Ambulatory Visit: Payer: Self-pay | Admitting: Internal Medicine

## 2023-09-14 ENCOUNTER — Encounter: Payer: Self-pay | Admitting: Internal Medicine

## 2023-09-16 ENCOUNTER — Encounter: Payer: Self-pay | Admitting: Family Medicine

## 2023-09-18 NOTE — Telephone Encounter (Signed)
 She can have a sample of the 2.5mg  Mounjaro, she will need to do 2 shots a week to make 5mg 

## 2023-10-03 ENCOUNTER — Ambulatory Visit: Admitting: Family Medicine

## 2023-10-04 ENCOUNTER — Ambulatory Visit: Admitting: Family Medicine

## 2023-10-04 ENCOUNTER — Encounter: Payer: Self-pay | Admitting: Dermatology

## 2023-10-04 MED ORDER — VALACYCLOVIR HCL 500 MG PO TABS: 500.0000 mg | ORAL_TABLET | Freq: Every day | ORAL | 11 refills | Status: DC

## 2023-10-04 NOTE — Addendum Note (Signed)
 Addended by: Mara Seminole A on: 10/04/2023 11:02 AM   Modules accepted: Orders

## 2023-10-16 ENCOUNTER — Encounter: Payer: Self-pay | Admitting: Family Medicine

## 2023-10-16 ENCOUNTER — Ambulatory Visit (INDEPENDENT_AMBULATORY_CARE_PROVIDER_SITE_OTHER): Admitting: Family Medicine

## 2023-10-16 VITALS — BP 126/70 | HR 73 | Ht 66.0 in | Wt 163.8 lb

## 2023-10-16 DIAGNOSIS — E119 Type 2 diabetes mellitus without complications: Secondary | ICD-10-CM | POA: Diagnosis not present

## 2023-10-16 DIAGNOSIS — E0590 Thyrotoxicosis, unspecified without thyrotoxic crisis or storm: Secondary | ICD-10-CM

## 2023-10-16 DIAGNOSIS — E785 Hyperlipidemia, unspecified: Secondary | ICD-10-CM

## 2023-10-16 DIAGNOSIS — Z7985 Long-term (current) use of injectable non-insulin antidiabetic drugs: Secondary | ICD-10-CM

## 2023-10-16 LAB — POCT GLYCOSYLATED HEMOGLOBIN (HGB A1C)
Est. average glucose Bld gHb Est-mCnc: 111
Hemoglobin A1C: 5.5 % (ref 4.0–5.6)

## 2023-10-16 MED ORDER — TIRZEPATIDE 5 MG/0.5ML ~~LOC~~ SOAJ: 5.0000 mg | SUBCUTANEOUS | 1 refills | Status: DC

## 2023-10-16 NOTE — Patient Instructions (Signed)
 Connie Bradford  Please review the attached list of medications and notify my office if there are any errors.   . Please bring all of your medications to every appointment so we can make sure that our medication list is the same as yours.

## 2023-10-16 NOTE — Progress Notes (Signed)
 Established patient visit   Patient: Connie Bradford   DOB: 07-Jul-1960   62 y.o. Female  MRN: [id number] Visit Date: 10/16/2023  Today's healthcare provider: Nancyann Perry, MD   Chief Complaint  Patient presents with   Medical Management of Chronic Issues    3 month follow-up t2dm and assess treatment response to Mounjaro    Subjective    HPI Follow up diabetes since starting Mounjaro  in March, titrated to 5mg  in April. Had mild constipation initially, but now resolved, no other side effects. Sugar hovering around 100.   Lab Results  Component Value Date   HGBA1C 5.5 10/16/2023   HGBA1C 8.1 (A) 07/03/2023   HGBA1C 6.7 (A) 03/03/2023   Wt Readings from Last 3 Encounters:  10/16/23 163 lb 12.8 oz (74.3 kg)  07/03/23 185 lb 8 oz (84.1 kg)  05/12/23 186 lb (84.4 kg)     Medications: Outpatient Medications Prior to Visit  Medication Sig   anastrozole  (ARIMIDEX ) 1 MG tablet Take 1 tablet by mouth once daily   B COMPLEX VITAMINS PO Take 1 tablet by mouth daily.    Blood Glucose Monitoring Suppl (ONE TOUCH ULTRA 2) w/Device KIT Use to check blood sugar daily for type 2 diabetes E11.9   Cholecalciferol (VITAMIN D ) 125 MCG (5000 UT) CAPS Take 5,000 Units by mouth daily.    clobetasol  cream (TEMOVATE ) 0.05 % Apply 1 application. topically 2 (two) times daily.   COLLAGEN PO Take 1 tablet by mouth daily. Super Collagen w/Biotin   Crisaborole  (EUCRISA ) 2 % OINT Apply twice daily to affected areas at lips.   glucose blood (ONETOUCH ULTRA) test strip Use to check blood sugar daily for type 2 diabetes E11.9   ibuprofen (ADVIL) 200 MG tablet Take 600 mg by mouth every 8 (eight) hours as needed (for pain.).    ketoconazole  (NIZORAL ) 2 % cream Apply 1 gram to corners of mouth twice a day   metFORMIN  (GLUCOPHAGE -XR) 500 MG 24 hr tablet Take 1 tablet (500 mg total) by mouth daily with breakfast.   Multiple Vitamin (MULTIVITAMIN WITH MINERALS) TABS tablet Take 1 tablet by mouth daily.    Omega 3-6-9 Fatty Acids (OMEGA 3-6-9 COMPLEX) CAPS Take 1 capsule by mouth daily.   ondansetron  (ZOFRAN ) 8 MG tablet One pill every 8 hours as needed for nausea/vomitting.   prochlorperazine  (COMPAZINE ) 10 MG tablet Take 1 tablet (10 mg total) by mouth every 6 (six) hours as needed for nausea or vomiting.   valACYclovir  (VALTREX ) 500 MG tablet Take 1 tablet (500 mg total) by mouth daily.   tirzepatide  (MOUNJARO ) 5 MG/0.5ML Pen Inject 5 mg into the skin once a week.   No facility-administered medications prior to visit.    Review of Systems  Constitutional:  Negative for appetite change, chills, fatigue and fever.  Respiratory:  Negative for chest tightness and shortness of breath.   Cardiovascular:  Negative for chest pain and palpitations.  Gastrointestinal:  Negative for abdominal pain, nausea and vomiting.  Neurological:  Negative for dizziness and weakness.       Objective    BP 126/70 (BP Location: Left Arm, Patient Position: Sitting, Cuff Size: Normal)   Pulse 73   Ht 5' 6 (1.676 m)   Wt 163 lb 12.8 oz (74.3 kg)   LMP 12/18/2013   SpO2 100%   BMI 26.44 kg/m    Physical Exam   General appearance: Well developed, well nourished female, cooperative and in no acute distress Head: Normocephalic, without  obvious abnormality, atraumatic Respiratory: Respirations even and unlabored, normal respiratory rate Extremities: All extremities are intact.  Skin: Skin color, texture, turgor normal. No rashes seen  Psych: Appropriate mood and affect. Neurologic: Mental status: Alert, oriented to person, place, and time, thought content appropriate.   Results for orders placed or performed in visit on 10/16/23  POCT glycosylated hemoglobin (Hb A1C)  Result Value Ref Range   Hemoglobin A1C 5.5 4.0 - 5.6 %   Est. average glucose Bld gHb Est-mCnc 111     Assessment & Plan     1. Type 2 diabetes mellitus without complication, unspecified whether long term insulin use (HCC)  (Primary) Doing very well with addition of Mounjaro . Continue current dose   Continue 5mg  Mounjaro . Consider d/c metformin  if continues to do well at follow up in about 4 months.   2. Dyslipidemia - Comprehensive metabolic panel with GFR - Lipid panel  2. Hyperthyroidism - TSH   Return in about 20 weeks (around 03/04/2024) for Diabetes.         Nancyann Perry, MD  [facility] [phone] (phone) [phone] (fax)  [facility]

## 2023-10-17 ENCOUNTER — Ambulatory Visit: Payer: Self-pay | Admitting: Family Medicine

## 2023-10-17 DIAGNOSIS — E0590 Thyrotoxicosis, unspecified without thyrotoxic crisis or storm: Secondary | ICD-10-CM

## 2023-10-17 LAB — COMPREHENSIVE METABOLIC PANEL WITH GFR
ALT: 21 IU/L (ref 0–32)
AST: 23 IU/L (ref 0–40)
Albumin: 5 g/dL — ABNORMAL HIGH (ref 3.8–4.9)
Alkaline Phosphatase: 90 IU/L (ref 44–121)
BUN/Creatinine Ratio: 22 (ref 12–28)
BUN: 19 mg/dL (ref 8–27)
Bilirubin Total: 0.5 mg/dL (ref 0.0–1.2)
CO2: 19 mmol/L — ABNORMAL LOW (ref 20–29)
Calcium: 10.7 mg/dL — ABNORMAL HIGH (ref 8.7–10.3)
Chloride: 101 mmol/L (ref 96–106)
Creatinine, Ser: 0.86 mg/dL (ref 0.57–1.00)
Globulin, Total: 2.2 g/dL (ref 1.5–4.5)
Glucose: 93 mg/dL (ref 70–99)
Potassium: 4.7 mmol/L (ref 3.5–5.2)
Sodium: 140 mmol/L (ref 134–144)
Total Protein: 7.2 g/dL (ref 6.0–8.5)
eGFR: 77 mL/min/{1.73_m2} (ref >59)

## 2023-10-17 LAB — LIPID PANEL
Chol/HDL Ratio: 5.1 ratio — ABNORMAL HIGH (ref 0.0–4.4)
Cholesterol, Total: 300 mg/dL — ABNORMAL HIGH (ref 100–199)
HDL: 59 mg/dL (ref >39)
LDL Chol Calc (NIH): 194 mg/dL — ABNORMAL HIGH (ref 0–99)
Triglycerides: 243 mg/dL — ABNORMAL HIGH (ref 0–149)
VLDL Cholesterol Cal: 47 mg/dL — ABNORMAL HIGH (ref 5–40)

## 2023-10-27 ENCOUNTER — Encounter: Payer: Self-pay | Admitting: Family Medicine

## 2023-10-27 LAB — SPECIMEN STATUS REPORT

## 2023-10-27 LAB — TSH: TSH: 1.08 u[IU]/mL (ref 0.450–4.500)

## 2023-11-09 ENCOUNTER — Inpatient Hospital Stay: Payer: BC Managed Care – PPO | Attending: Internal Medicine

## 2023-11-09 ENCOUNTER — Encounter: Payer: Self-pay | Admitting: Internal Medicine

## 2023-11-09 ENCOUNTER — Inpatient Hospital Stay (HOSPITAL_BASED_OUTPATIENT_CLINIC_OR_DEPARTMENT_OTHER): Payer: BC Managed Care – PPO | Admitting: Internal Medicine

## 2023-11-09 VITALS — BP 129/71 | HR 68 | Temp 96.2°F | Resp 16 | Ht 66.0 in | Wt 161.1 lb

## 2023-11-09 DIAGNOSIS — E669 Obesity, unspecified: Secondary | ICD-10-CM | POA: Insufficient documentation

## 2023-11-09 DIAGNOSIS — Z79811 Long term (current) use of aromatase inhibitors: Secondary | ICD-10-CM | POA: Insufficient documentation

## 2023-11-09 DIAGNOSIS — Z170 Estrogen receptor positive status [ER+]: Secondary | ICD-10-CM | POA: Insufficient documentation

## 2023-11-09 DIAGNOSIS — C50411 Malignant neoplasm of upper-outer quadrant of right female breast: Secondary | ICD-10-CM | POA: Diagnosis not present

## 2023-11-09 DIAGNOSIS — Z1721 Progesterone receptor positive status: Secondary | ICD-10-CM | POA: Diagnosis not present

## 2023-11-09 DIAGNOSIS — K760 Fatty (change of) liver, not elsewhere classified: Secondary | ICD-10-CM | POA: Insufficient documentation

## 2023-11-09 DIAGNOSIS — Z87891 Personal history of nicotine dependence: Secondary | ICD-10-CM | POA: Diagnosis not present

## 2023-11-09 DIAGNOSIS — N951 Menopausal and female climacteric states: Secondary | ICD-10-CM | POA: Insufficient documentation

## 2023-11-09 DIAGNOSIS — M8580 Other specified disorders of bone density and structure, unspecified site: Secondary | ICD-10-CM | POA: Diagnosis not present

## 2023-11-09 DIAGNOSIS — M85852 Other specified disorders of bone density and structure, left thigh: Secondary | ICD-10-CM

## 2023-11-09 LAB — CBC WITH DIFFERENTIAL (CANCER CENTER ONLY)
Abs Immature Granulocytes: 0.02 K/uL (ref 0.00–0.07)
Basophils Absolute: 0.1 K/uL (ref 0.0–0.1)
Basophils Relative: 2 %
Eosinophils Absolute: 0.1 K/uL (ref 0.0–0.5)
Eosinophils Relative: 3 %
HCT: 40.3 % (ref 36.0–46.0)
Hemoglobin: 13.1 g/dL (ref 12.0–15.0)
Immature Granulocytes: 0 %
Lymphocytes Relative: 27 %
Lymphs Abs: 1.5 K/uL (ref 0.7–4.0)
MCH: 29.8 pg (ref 26.0–34.0)
MCHC: 32.5 g/dL (ref 30.0–36.0)
MCV: 91.6 fL (ref 80.0–100.0)
Monocytes Absolute: 0.5 K/uL (ref 0.1–1.0)
Monocytes Relative: 9 %
Neutro Abs: 3.3 K/uL (ref 1.7–7.7)
Neutrophils Relative %: 59 %
Platelet Count: 198 K/uL (ref 150–400)
RBC: 4.4 MIL/uL (ref 3.87–5.11)
RDW: 12.9 % (ref 11.5–15.5)
WBC Count: 5.5 K/uL (ref 4.0–10.5)
nRBC: 0 % (ref 0.0–0.2)

## 2023-11-09 LAB — CMP (CANCER CENTER ONLY)
ALT: 21 U/L (ref 0–44)
AST: 23 U/L (ref 15–41)
Albumin: 4.5 g/dL (ref 3.5–5.0)
Alkaline Phosphatase: 68 U/L (ref 38–126)
Anion gap: 9 (ref 5–15)
BUN: 14 mg/dL (ref 6–20)
CO2: 23 mmol/L (ref 22–32)
Calcium: 9.7 mg/dL (ref 8.9–10.3)
Chloride: 107 mmol/L (ref 98–111)
Creatinine: 0.71 mg/dL (ref 0.44–1.00)
GFR, Estimated: >60 mL/min (ref >60)
Glucose, Bld: 98 mg/dL (ref 70–99)
Potassium: 4.1 mmol/L (ref 3.5–5.1)
Sodium: 139 mmol/L (ref 135–145)
Total Bilirubin: 0.8 mg/dL (ref 0.0–1.2)
Total Protein: 7 g/dL (ref 6.5–8.1)

## 2023-11-09 LAB — VITAMIN D 25 HYDROXY (VIT D DEFICIENCY, FRACTURES): Vit D, 25-Hydroxy: 61.8 ng/mL (ref 30–100)

## 2023-11-09 MED ORDER — ANASTROZOLE 1 MG PO TABS: 1.0000 mg | ORAL_TABLET | Freq: Every day | ORAL | 2 refills | Status: DC

## 2023-11-09 NOTE — Progress Notes (Signed)
 [facility] CONSULT NOTE  Patient Care Team: Gasper Nancyann BRAVO, MD as PCP - General (Family Medicine) [facility] Gyn (Gynecology) Dannielle Arlean FALCON, RN (Inactive) as Oncology Nurse Navigator Rennie Cindy SAUNDERS, MD as Consulting Physician (Oncology) Lenn Aran, MD as Consulting Physician (Radiation Oncology) Rodolph Romano, MD as Consulting Physician (General Surgery)  CHIEF COMPLAINTS/PURPOSE OF CONSULTATION: Breast cancer  #  Oncology History Overview Note  # MARCH 2021- RUOQ- 7mm- [BREAST, RIGHT, LATERAL POSTERIOR  - INVASIVE MAMMARY CARCINOMA, NO SPECIAL TYPE, ASSOCIATED WITH  CALCIFICATIONS.]IMC; ER-> 90%; PR- 50-90%; HER 2 FISH- POSITIVE  # S/P LUMPECTOMY [Dr.Cintron] pT1a [30mm];pN0 [STAGE IA- ER/PRpoS; Her 2 POS]  . BREAST, RIGHT, LATERAL MIDDLE DEPTH (RIBBON-SHAPED CLIP):  STEREOTACTIC-GUIDED CORE BIOPSY: - DUCTAL CARCINOMA IN SITU (DCIS), HIGH-GRADE, ASSOCIATED WITH  CALCIFICATIONS.   # May 14th,2021- TH-H; s/p RT [finished OCT 2021]; NOV 23rd 2021- start Anastrazole; ; s/p  Herceptin  maintenance [May 2022].  #Hypothyroidism-methimazole  [Dr.Solum]  # SURVIVORSHIP:   # GENETICS:   DIAGNOSIS: Breast cancer  STAGE:   1      ;  GOALS: Cure  CURRENT/MOST RECENT THERAPY : Taxol  Herceptin    Carcinoma of upper-outer quadrant of right breast in female, estrogen receptor positive (HCC)  07/15/2019 Initial Diagnosis   Carcinoma of upper-outer quadrant of right breast in female, estrogen receptor positive (HCC)   08/28/2019 - 09/03/2020 Chemotherapy   Patient is on Treatment Plan : BREAST weekly PACLitaxel  / trastuzumab  / Maintenance trastuzumab  every 21 days       HISTORY OF PRESENTING ILLNESS: Alone.  Ambulating independently.  Connie Bradford 63 y.o.  female with breast cancer early stage-ER/PR positive HER-2/neu positive on adjuvant anastrozole  is here for follow-up.  Patient denies any joint pains. Denies any significant hot flashes. Denies  any chest pain or shortness of breath or cough.  No nausea no vomiting.  Patient admits to compliance with her anastrozole .  Patient is currently on Mounjaro -no significant weight loss.  Notes to have significant movement of energy levels.    Review of Systems  Constitutional:  Negative for chills, diaphoresis, fever and weight loss.  HENT:  Negative for nosebleeds and sore throat.   Eyes:  Negative for double vision.  Respiratory:  Negative for cough, hemoptysis, sputum production, shortness of breath and wheezing.   Cardiovascular:  Negative for chest pain, palpitations and orthopnea.  Gastrointestinal:  Negative for abdominal pain, blood in stool, constipation, diarrhea, heartburn, melena, nausea and vomiting.  Genitourinary:  Negative for dysuria, frequency and urgency.  Musculoskeletal:  Negative for back pain and joint pain.  Skin:  Negative for itching.  Neurological:  Negative for dizziness, focal weakness, weakness and headaches.  Endo/Heme/Allergies:  Does not bruise/bleed easily.  Psychiatric/Behavioral:  Negative for depression. The patient is not nervous/anxious and does not have insomnia.      MEDICAL HISTORY:  Past Medical History:  Diagnosis Date   Atypical mole 09/26/2018   right mid anterior side/mild   Basal cell carcinoma 08/25/2014   right med cheek infraorbital   Breast cancer (HCC) 07/05/19   [facility] and DCIS   Dyslipidemia    Family history of bladder cancer    Graves disease    Hyperthyroidism    Inborn lipid storage disorder    Leiomyoma of uterus    Personal history of chemotherapy 2021   RIGHT lumpectomy   Personal history of radiation therapy 2021   RIGHT lumpectomy   Vitamin D  deficiency     SURGICAL HISTORY: Past Surgical History:  Procedure Laterality Date   APPENDECTOMY  1982   Cyst removed from ovaries    BREAST BIOPSY Right 07/05/2019   stereo bx, coil clip, [facility]   BREAST BIOPSY Right 07/05/2019   stereo bx, ribbon clip, DCIS    BREAST  LUMPECTOMY Right 07/31/2019    [facility] and DCIS with SN bx   COLONOSCOPY WITH PROPOFOL  N/A 01/20/2017   Procedure: COLONOSCOPY WITH PROPOFOL ;  Surgeon: Therisa Bi, MD;  Location: [facility] ENDOSCOPY;  Service: Gastroenterology;  Laterality: N/A;   LAPAROSCOPIC OVARIAN CYSTECTOMY  1982   NECK SURGERY  2013   LIft from weight loss   PARTIAL MASTECTOMY WITH NEEDLE LOCALIZATION AND AXILLARY SENTINEL LYMPH NODE BX Right 07/31/2019   Procedure: RIGHT PARTIAL MASTECTOMY WITH BRACKETED NEEDLE LOCALIZATION AND LEFT AXILLARY SENTINEL LYMPH NODE BIOPSY;  Surgeon: Rodolph Romano, MD;  Location: [facility] ORS;  Service: General;  Laterality: Right;   PORTACATH PLACEMENT Left 07/31/2019   Procedure: INSERTION PORT-A-CATH LEFT INTERNAL JUGULAR;  Surgeon: Rodolph Romano, MD;  Location: [facility] ORS;  Service: General;  Laterality: Left;   TONSILLECTOMY  1969    SOCIAL HISTORY: Social History   Socioeconomic History   Marital status: Married    Spouse name: Not on file   Number of children: 0   Years of education: Coll Grad   Highest education level: Associate degree: academic program  Occupational History   Occupation: Full-Time    Comment: Scientist, [employer]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 Quit smoking in 2007 [25 years]; ocassional alcohol. 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    Social Drivers of Corporate investment banker Strain: Low Risk  (09/29/2023)   Overall Financial Resource Strain (CARDIA)    Difficulty of Paying Living Expenses: Not hard at all  Food Insecurity: No Food Insecurity (09/29/2023)   Hunger  Vital Sign    Worried About Running Out of Food in the Last Year: Never true    Ran Out of Food in the Last Year: Never true  Transportation Needs: No Transportation Needs (09/29/2023)   PRAPARE - Administrator, Civil Service (Medical): No    Lack of Transportation (Non-Medical): No  Physical Activity: Sufficiently Active (09/29/2023)   Exercise Vital Sign    Days of Exercise per Week: 6 days    Minutes of Exercise per Session: 60 min  Recent Concern: Physical Activity - Insufficiently Active (07/02/2023)   Exercise Vital Sign    Days of Exercise per Week: 3 days    Minutes of Exercise per Session: 40 min  Stress: No Stress Concern Present (09/29/2023)   [employer] of Occupational Health - Occupational Stress Questionnaire    Feeling of Stress: Not at all  Recent Concern: Stress - Stress Concern Present (07/02/2023)   [employer] of Occupational Health - Occupational Stress Questionnaire    Feeling of Stress : To some extent  Social Connections: Socially Integrated (09/29/2023)   Social Connection and Isolation Panel    Frequency of Communication with Friends and Family: Twice a week    Frequency  of Social Gatherings with Friends and Family: Twice a week    Attends Religious Services: More than 4 times per year    Active Member of [employer] or Organizations: Yes    Attends Engineer, structural: More than 4 times per year    Marital Status: Married  Catering manager Violence: Not At Risk (05/26/2017)   Humiliation, Afraid, Rape, and Kick questionnaire    Fear of Current or Ex-Partner: No    Emotionally Abused: No    Physically Abused: No    Sexually Abused: No    FAMILY HISTORY: Family History  Problem Relation Age of Onset   Bladder Cancer Mother    Cancer Mother    Diabetes Father    Heart disease Father    Bladder Cancer Brother    Diabetes Mellitus II Brother    Diabetes Brother    Diabetes Mellitus II Brother    Diabetes Mellitus II Brother     Diabetes Brother    Breast cancer Neg Hx     ALLERGIES:  is allergic to penicillins.  MEDICATIONS:  Current Outpatient Medications  Medication Sig Dispense Refill   B COMPLEX VITAMINS PO Take 1 tablet by mouth daily.      Blood Glucose Monitoring Suppl (ONE TOUCH ULTRA 2) w/Device KIT Use to check blood sugar daily for type 2 diabetes E11.9 1 kit 0   Cholecalciferol (VITAMIN D ) 125 MCG (5000 UT) CAPS Take 5,000 Units by mouth daily.      COLLAGEN PO Take 1 tablet by mouth daily. Super Collagen w/Biotin     Crisaborole  (EUCRISA ) 2 % OINT Apply twice daily to affected areas at lips. 60 g 1   glucose blood (ONETOUCH ULTRA) test strip Use to check blood sugar daily for type 2 diabetes E11.9 100 strip 4   ibuprofen (ADVIL) 200 MG tablet Take 600 mg by mouth every 8 (eight) hours as needed (for pain.).      ketoconazole  (NIZORAL ) 2 % cream Apply 1 gram to corners of mouth twice a day 15 g 1   metFORMIN  (GLUCOPHAGE -XR) 500 MG 24 hr tablet Take 1 tablet (500 mg total) by mouth daily with breakfast. 90 tablet 3   Multiple Vitamin (MULTIVITAMIN WITH MINERALS) TABS tablet Take 1 tablet by mouth daily.     Omega 3-6-9 Fatty Acids (OMEGA 3-6-9 COMPLEX) CAPS Take 1 capsule by mouth daily.     ondansetron  (ZOFRAN ) 8 MG tablet One pill every 8 hours as needed for nausea/vomitting. 40 tablet 1   prochlorperazine  (COMPAZINE ) 10 MG tablet Take 1 tablet (10 mg total) by mouth every 6 (six) hours as needed for nausea or vomiting. 40 tablet 1   tirzepatide  (MOUNJARO ) 5 MG/0.5ML Pen Inject 5 mg into the skin once a week. 6 mL 1   anastrozole  (ARIMIDEX ) 1 MG tablet Take 1 tablet (1 mg total) by mouth daily. 90 tablet 2   clobetasol  cream (TEMOVATE ) 0.05 % Apply 1 application. topically 2 (two) times daily. (Patient not taking: Reported on 11/09/2023) 60 g 1   valACYclovir  (VALTREX ) 500 MG tablet Take 1 tablet (500 mg total) by mouth daily. 30 tablet 11   No current facility-administered medications for this  visit.    PHYSICAL EXAMINATION: ECOG PERFORMANCE STATUS: 0 - Asymptomatic  Vitals:   11/09/23 1030  BP: 129/71  Pulse: 68  Resp: 16  Temp: (!) 96.2 F (35.7 C)  SpO2: 100%    Filed Weights   11/09/23 1030  Weight: 161 lb 1.6 oz (  73.1 kg)     Physical Exam HENT:     Head: Normocephalic and atraumatic.     Mouth/Throat:     Pharynx: No oropharyngeal exudate.  Eyes:     Pupils: Pupils are equal, round, and reactive to light.  Cardiovascular:     Rate and Rhythm: Normal rate and regular rhythm.  Pulmonary:     Effort: Pulmonary effort is normal. No respiratory distress.     Breath sounds: Normal breath sounds. No wheezing.  Abdominal:     General: Bowel sounds are normal. There is no distension.     Palpations: Abdomen is soft. There is no mass.     Tenderness: There is no abdominal tenderness. There is no guarding or rebound.  Musculoskeletal:        General: No tenderness. Normal range of motion.     Cervical back: Normal range of motion and neck supple.  Skin:    General: Skin is warm.  Neurological:     Mental Status: She is alert and oriented to person, place, and time.  Psychiatric:        Mood and Affect: Affect normal.    LABORATORY DATA:  I have reviewed the data as listed Lab Results  Component Value Date   WBC 5.5 11/09/2023   HGB 13.1 11/09/2023   HCT 40.3 11/09/2023   MCV 91.6 11/09/2023   PLT 198 11/09/2023   Recent Labs    05/12/23 0753 10/16/23 1130 11/09/23 1036  NA 138 140 139  K 4.3 4.7 4.1  CL 105 101 107  CO2 25 19* 23  GLUCOSE 214* 93 98  BUN 15 19 14   CREATININE 0.67 0.86 0.71  CALCIUM 9.4 10.7* 9.7  GFRNONAA >60  --  >60  PROT 6.9 7.2 7.0  ALBUMIN 4.3 5.0* 4.5  AST 43* 23 23  ALT 62* 21 21  ALKPHOS 68 90 68  BILITOT 0.8 0.5 0.8    RADIOGRAPHIC STUDIES: I have personally reviewed the radiological images as listed and agreed with the findings in the report. No results found.  ASSESSMENT & PLAN:   Carcinoma of  upper-outer quadrant of right breast in female, estrogen receptor positive (HCC) #Right breast-invasive mammary carcinoma-s/p lumpectomy- STAGE I- TRIPLE POSITIVE; On Anastrozole  since NOV 2021.   #   Stable. Discussed re: duration of Endocrine therapy 5 vs 10 years. Continue for a minimum of 5 for now.  # FEB 2025 [Dr.Cintron] BIL Mamm- WNL-   Patient tolerating anastrozole  well. Repeat mammo- in feb 2025.   # Hot flashes- G-1- sec to anastrazole. stable.  # OSTEOPENIA -BMD  on ADJUVANT zometa  q 6 M [jan 6th,2022 x3 years; DEC 2023- T-score of -1.3.; proceed with LAST zometa  today-  stable.  Will repeat bone density-October 2025 [patient preference/insurance]  # SWOG study/PN study- PN- G-0-1 monitor for now.  stable.  # Obesity- Fatty liver- on US - AST/ALT -  on Munjauro- # -grade 1 slightly elevated;--  stable.  Congratulated patient on weight loss journey.  # IV acess: PIV s/p port explantion  # Disposition:   # BMD- in sep -oct 2025 # follow up in 7  months- MD;;labs- cbc/cmp; vit D 25- [place] levels; Dr.B  All questions were answered. The patient/family knows to call the clinic with any problems, questions or concerns.    Cindy JONELLE Joe, MD 11/09/2023 11:48 AM

## 2023-11-09 NOTE — Assessment & Plan Note (Addendum)
#  Right breast-invasive mammary carcinoma-s/p lumpectomy- STAGE I- TRIPLE POSITIVE; On Anastrozole  since NOV 2021.   #   Stable. Discussed re: duration of Endocrine therapy 5 vs 10 years. Continue for a minimum of 5 for now.  # FEB 2025 [Dr.Cintron] BIL Mamm- WNL-   Patient tolerating anastrozole  well. Repeat mammo- in feb 2025.   # Hot flashes- G-1- sec to anastrazole. stable.  # OSTEOPENIA -BMD  on ADJUVANT zometa  q 6 M [jan 6th,2022 x3 years; DEC 2023- T-score of -1.3.; proceed with LAST zometa  today-  stable.  Will repeat bone density-October 2025 [patient preference/insurance]  # SWOG study/PN study- PN- G-0-1 monitor for now.  stable.  # Obesity- Fatty liver- on US - AST/ALT -  on Munjauro- # -grade 1 slightly elevated;--  stable.  Congratulated patient on weight loss journey.  # IV acess: PIV s/p port explantion  # Disposition:   # BMD- in sep -oct 2025 # follow up in 7  months- MD;;labs- cbc/cmp; vit D 25- [place] levels; Dr.B

## 2023-11-09 NOTE — Progress Notes (Signed)
 No concerns today

## 2023-11-30 ENCOUNTER — Ambulatory Visit: Admitting: Certified Nurse Midwife

## 2023-12-12 ENCOUNTER — Encounter: Payer: Self-pay | Admitting: Obstetrics

## 2023-12-12 ENCOUNTER — Ambulatory Visit: Admitting: Obstetrics

## 2023-12-12 VITALS — BP 138/78 | HR 72 | Ht 66.0 in | Wt 161.0 lb

## 2023-12-12 DIAGNOSIS — Z01419 Encounter for gynecological examination (general) (routine) without abnormal findings: Secondary | ICD-10-CM | POA: Diagnosis not present

## 2023-12-12 NOTE — Progress Notes (Signed)
 ANNUAL GYNECOLOGICAL EXAM  SUBJECTIVE  HPI  Connie Bradford is a 63 y.o.-year-old G0P0000 who presents for an annual gynecological exam today.  She denies pelvic pain, dyspareunia, abnormal vaginal bleeding or discharge, and UTI symptoms. She is currently sexually active with one female partner. She went through menopause approximately 10 years ago.   Medical/Surgical History Past Medical History:  Diagnosis Date   Atypical mole 09/26/2018   right mid anterior side/mild   Basal cell carcinoma 08/25/2014   right med cheek infraorbital   Breast cancer (HCC) 07/05/19   [facility] and DCIS   Dyslipidemia    Family history of bladder cancer    Graves disease    Hyperthyroidism    Inborn lipid storage disorder    Leiomyoma of uterus    Personal history of chemotherapy 2021   RIGHT lumpectomy   Personal history of radiation therapy 2021   RIGHT lumpectomy   Vitamin D  deficiency    Past Surgical History:  Procedure Laterality Date   APPENDECTOMY  1982   Cyst removed from ovaries    BREAST BIOPSY Right 07/05/2019   stereo bx, coil clip, [facility]   BREAST BIOPSY Right 07/05/2019   stereo bx, ribbon clip, DCIS    BREAST LUMPECTOMY Right 07/31/2019    [facility] and DCIS with SN bx   COLONOSCOPY WITH PROPOFOL  N/A 01/20/2017   Procedure: COLONOSCOPY WITH PROPOFOL ;  Surgeon: Therisa Bi, MD;  Location: [facility] ENDOSCOPY;  Service: Gastroenterology;  Laterality: N/A;   LAPAROSCOPIC OVARIAN CYSTECTOMY  1982   NECK SURGERY  2013   LIft from weight loss   PARTIAL MASTECTOMY WITH NEEDLE LOCALIZATION AND AXILLARY SENTINEL LYMPH NODE BX Right 07/31/2019   Procedure: RIGHT PARTIAL MASTECTOMY WITH BRACKETED NEEDLE LOCALIZATION AND LEFT AXILLARY SENTINEL LYMPH NODE BIOPSY;  Surgeon: Rodolph Romano, MD;  Location: [facility] ORS;  Service: General;  Laterality: Right;   PORTACATH PLACEMENT Left 07/31/2019   Procedure: INSERTION PORT-A-CATH LEFT INTERNAL JUGULAR;  Surgeon: Rodolph Romano, MD;  Location: [facility]  ORS;  Service: General;  Laterality: Left;   TONSILLECTOMY  1969    Social History Lives with husband. Feels safe there Work: Education officer, environmental at [employer] Substances: EtOH ~once a week; denies tobacco, vape, and recreational drugs  Obstetric History OB History     Gravida  0   Para  0   Term  0   Preterm  0   AB  0   Living  0      SAB  0   IAB  0   Ectopic  0   Multiple  0   Live Births  0            GYN/Menstrual History Patient's last menstrual period was 12/18/2013. Last Pap: 11/2022. NILM, negative HPV Contraception: menopause  Prevention Endorses regular dental and eye exams Mammogram: yearly Colonoscopy: PCP  Current Medications Outpatient Medications Prior to Visit  Medication Sig   anastrozole  (ARIMIDEX ) 1 MG tablet Take 1 tablet (1 mg total) by mouth daily.   B COMPLEX VITAMINS PO Take 1 tablet by mouth daily.    Blood Glucose Monitoring Suppl (ONE TOUCH ULTRA 2) w/Device KIT Use to check blood sugar daily for type 2 diabetes E11.9   Cholecalciferol (VITAMIN D ) 125 MCG (5000 UT) CAPS Take 5,000 Units by mouth daily.    clobetasol  cream (TEMOVATE ) 0.05 % Apply 1 application. topically 2 (two) times daily. (Patient not taking: Reported on 11/09/2023)   COLLAGEN PO Take 1 tablet by mouth daily.  Super Collagen w/Biotin   Crisaborole  (EUCRISA ) 2 % OINT Apply twice daily to affected areas at lips.   glucose blood (ONETOUCH ULTRA) test strip Use to check blood sugar daily for type 2 diabetes E11.9   ibuprofen (ADVIL) 200 MG tablet Take 600 mg by mouth every 8 (eight) hours as needed (for pain.).    ketoconazole  (NIZORAL ) 2 % cream Apply 1 gram to corners of mouth twice a day   metFORMIN  (GLUCOPHAGE -XR) 500 MG 24 hr tablet Take 1 tablet (500 mg total) by mouth daily with breakfast.   Multiple Vitamin (MULTIVITAMIN WITH MINERALS) TABS tablet Take 1 tablet by mouth daily.   Omega 3-6-9 Fatty Acids (OMEGA 3-6-9 COMPLEX) CAPS Take 1  capsule by mouth daily.   ondansetron  (ZOFRAN ) 8 MG tablet One pill every 8 hours as needed for nausea/vomitting.   prochlorperazine  (COMPAZINE ) 10 MG tablet Take 1 tablet (10 mg total) by mouth every 6 (six) hours as needed for nausea or vomiting.   tirzepatide  (MOUNJARO ) 5 MG/0.5ML Pen Inject 5 mg into the skin once a week.   valACYclovir  (VALTREX ) 500 MG tablet Take 1 tablet (500 mg total) by mouth daily.   No facility-administered medications prior to visit.        ROS Constitutional: Denied constitutional symptoms, night sweats, recent illness, fatigue, fever, insomnia and weight loss.  Eyes: Denied eye symptoms, eye pain, photophobia, vision change and visual disturbance.  Ears/Nose/Throat/Neck: Denied ear, nose, throat or neck symptoms, hearing loss, nasal discharge, sinus congestion and sore throat.  Cardiovascular: Denied cardiovascular symptoms, arrhythmia, chest pain/pressure, edema, exercise intolerance, orthopnea and palpitations.  Respiratory: Denied pulmonary symptoms, asthma, pleuritic pain, productive sputum, cough, dyspnea and wheezing.  Gastrointestinal: Denied gastro-esophageal reflux, melena, nausea and vomiting.  Genitourinary: Denied genitourinary symptoms including symptomatic vaginal discharge, pelvic relaxation issues, and urinary complaints.  Musculoskeletal: Denied musculoskeletal symptoms, stiffness, swelling, muscle weakness and myalgia.  Dermatologic: Denied dermatology symptoms, rash and scar.  Neurologic: Denied neurology symptoms, dizziness, headache, neck pain and syncope.  Psychiatric: Denied psychiatric symptoms, anxiety and depression.  Endocrine: Denied endocrine symptoms. Occasional hot flashes and night sweats.    OBJECTIVE  BP 138/78   Pulse 72   Ht 5' 6 (1.676 m)   Wt 161 lb (73 kg)   LMP 12/18/2013   BMI 25.99 kg/m    Physical examination General NAD, Conversant  HEENT Atraumatic; Op clear with mmm.  Normo-cephalic. Pupils reactive.  Anicteric sclerae  Thyroid /Neck Smooth without nodularity or enlargement. Normal ROM.  Neck Supple.  Skin No rashes, lesions or ulceration. Normal palpated skin turgor. No nodularity.  Breasts: Declined  Lungs: Clear to auscultation.No rales or wheezes. Normal Respiratory effort, no retractions.  Heart: NSR.  No murmurs or rubs appreciated. No peripheral edema  Abdomen: Soft.  Non-tender.  No masses.  No HSM. No hernia  Extremities: Moves all appropriately.  Normal ROM for age. No lymphadenopathy.  Neuro: Oriented to PPT.  Normal mood. Normal affect.     Pelvic: Declined    ASSESSMENT  1) Annual exam  PLAN 1) Physical exam as noted. Discussed healthy lifestyle choices and preventive care. Will see oncologist for mammogram. 2) Pap due in 2029  Return in one year for annual exam or as needed for concerns.   Nashua Homewood, CNM

## 2023-12-20 ENCOUNTER — Ambulatory Visit: Admitting: Certified Nurse Midwife

## 2023-12-28 ENCOUNTER — Ambulatory Visit
Admission: RE | Admit: 2023-12-28 | Discharge: 2023-12-28 | Disposition: A | Discharge status: Home / Self Care | Source: Ambulatory Visit | Attending: Internal Medicine | Admitting: Internal Medicine

## 2023-12-28 DIAGNOSIS — Z170 Estrogen receptor positive status [ER+]: Secondary | ICD-10-CM | POA: Diagnosis not present

## 2023-12-28 DIAGNOSIS — M85852 Other specified disorders of bone density and structure, left thigh: Secondary | ICD-10-CM | POA: Insufficient documentation

## 2023-12-28 DIAGNOSIS — Z780 Asymptomatic menopausal state: Secondary | ICD-10-CM | POA: Diagnosis not present

## 2023-12-28 DIAGNOSIS — C50411 Malignant neoplasm of upper-outer quadrant of right female breast: Secondary | ICD-10-CM | POA: Insufficient documentation

## 2024-01-10 DIAGNOSIS — Z23 Encounter for immunization: Secondary | ICD-10-CM | POA: Diagnosis not present

## 2024-01-16 ENCOUNTER — Ambulatory Visit: Payer: BC Managed Care – PPO | Admitting: Dermatology

## 2024-01-16 DIAGNOSIS — L650 Telogen effluvium: Secondary | ICD-10-CM | POA: Diagnosis not present

## 2024-01-16 DIAGNOSIS — L821 Other seborrheic keratosis: Secondary | ICD-10-CM

## 2024-01-16 DIAGNOSIS — Q825 Congenital non-neoplastic nevus: Secondary | ICD-10-CM

## 2024-01-16 DIAGNOSIS — Z1283 Encounter for screening for malignant neoplasm of skin: Secondary | ICD-10-CM | POA: Diagnosis not present

## 2024-01-16 DIAGNOSIS — Z79899 Other long term (current) drug therapy: Secondary | ICD-10-CM

## 2024-01-16 DIAGNOSIS — L578 Other skin changes due to chronic exposure to nonionizing radiation: Secondary | ICD-10-CM

## 2024-01-16 DIAGNOSIS — W908XXA Exposure to other nonionizing radiation, initial encounter: Secondary | ICD-10-CM | POA: Diagnosis not present

## 2024-01-16 DIAGNOSIS — D229 Melanocytic nevi, unspecified: Secondary | ICD-10-CM

## 2024-01-16 DIAGNOSIS — Z853 Personal history of malignant neoplasm of breast: Secondary | ICD-10-CM

## 2024-01-16 DIAGNOSIS — L814 Other melanin hyperpigmentation: Secondary | ICD-10-CM

## 2024-01-16 DIAGNOSIS — Z85828 Personal history of other malignant neoplasm of skin: Secondary | ICD-10-CM

## 2024-01-16 DIAGNOSIS — Z86018 Personal history of other benign neoplasm: Secondary | ICD-10-CM

## 2024-01-16 DIAGNOSIS — Z7189 Other specified counseling: Secondary | ICD-10-CM

## 2024-01-16 DIAGNOSIS — D2271 Melanocytic nevi of right lower limb, including hip: Secondary | ICD-10-CM

## 2024-01-16 MED ORDER — MINOXIDIL 2.5 MG PO TABS: ORAL_TABLET | ORAL | 3 refills | Status: DC

## 2024-01-16 NOTE — Patient Instructions (Addendum)
 Start minoxidil 2.5 mg tab - take 1 / 2 tab by mouth daily for hair loss    Doses of oral minoxidil for hair loss are considered 'low dose'. This is because the doses used for hair loss are much lower than the doses which are used for conditions such as high blood pressure (hypertension). The doses used for hypertension are 10-40mg  per day.  Side effects are uncommon at the low doses (up to 2.5 mg/day) used to treat hair loss. Potential side effects, more commonly seen at higher doses, include: Increase in hair growth (hypertrichosis) elsewhere on face and body Temporary hair shedding upon starting medication which may last up to 4 weeks Ankle swelling, fluid retention, rapid weight gain more than 5 pounds Low blood pressure and feeling lightheaded or dizzy when standing up quickly Fast or irregular heartbeat Headaches Seborrheic Keratosis  What causes seborrheic keratoses? Seborrheic keratoses are harmless, common skin growths that first appear during adult life.  As time goes by, more growths appear.  Some people may develop a large number of them.  Seborrheic keratoses appear on both covered and uncovered body parts.  They are not caused by sunlight.  The tendency to develop seborrheic keratoses can be inherited.  They vary in color from skin-colored to gray, brown, or even black.  They can be either smooth or have a rough, warty surface.   Seborrheic keratoses are superficial and look as if they were stuck on the skin.  Under the microscope this type of keratosis looks like layers upon layers of skin.  That is why at times the top layer may seem to fall off, but the rest of the growth remains and re-grows.    Treatment Seborrheic keratoses do not need to be treated, but can easily be removed in the office.  Seborrheic keratoses often cause symptoms when they rub on clothing or jewelry.  Lesions can be in the way of shaving.  If they become inflamed, they can cause itching, soreness, or  burning.  Removal of a seborrheic keratosis can be accomplished by freezing, burning, or surgery. If any spot bleeds, scabs, or grows rapidly, please return to have it checked, as these can be an indication of a skin cancer.   Melanoma ABCDEs  Melanoma is the most dangerous type of skin cancer, and is the leading cause of death from skin disease.  You are more likely to develop melanoma if you: Have light-colored skin, light-colored eyes, or red or blond hair Spend a lot of time in the sun Tan regularly, either outdoors or in a tanning bed Have had blistering sunburns, especially during childhood Have a close family member who has had a melanoma Have atypical moles or large birthmarks  Early detection of melanoma is key since treatment is typically straightforward and cure rates are extremely high if we catch it early.   The first sign of melanoma is often a change in a mole or a new dark spot.  The ABCDE system is a way of remembering the signs of melanoma.  A for asymmetry:  The two halves do not match. B for border:  The edges of the growth are irregular. C for color:  A mixture of colors are present instead of an even brown color. D for diameter:  Melanomas are usually (but not always) greater than 6mm - the size of a pencil eraser. E for evolution:  The spot keeps changing in size, shape, and color.  Please check your skin once  per month between visits. You can use a [place] mirror in front and a large mirror behind you to keep an eye on the back side or your body.   If you see any new or changing lesions before your next follow-up, please call to schedule a visit.  Please continue daily skin protection including broad spectrum sunscreen SPF 30+ to sun-exposed areas, reapplying every 2 hours as needed when you're outdoors.   Staying in the shade or wearing long sleeves, sun glasses (UVA+UVB protection) and wide brim hats (4-inch brim around the entire circumference of the hat) are  also recommended for sun protection.    Due to recent changes in healthcare laws, you may see results of your pathology and/or laboratory studies on MyChart before the doctors have had a chance to review them. We understand that in some cases there may be results that are confusing or concerning to you. Please understand that not all results are received at the same time and often the doctors may need to interpret multiple results in order to provide you with the best plan of care or course of treatment. Therefore, we ask that you please give us  2 business days to thoroughly review all your results before contacting the office for clarification. Should we see a critical lab result, you will be contacted sooner.   If You Need Anything After Your Visit  If you have any questions or concerns for your doctor, please call our main line at [phone] and press option 4 to reach your doctor's medical assistant. If no one answers, please leave a voicemail as directed and we will return your call as soon as possible. Messages left after 4 pm will be answered the following business day.   You may also send us  a message via MyChart. We typically respond to MyChart messages within 1-2 business days.  For prescription refills, please ask your pharmacy to contact our office. Our fax number is [phone].  If you have an urgent issue when the clinic is closed that cannot wait until the next business day, you can page your doctor at the number below.    Please note that while we do our best to be available for urgent issues outside of office hours, we are not available 24/7.   If you have an urgent issue and are unable to reach us , you may choose to seek medical care at your doctor's office, retail clinic, urgent care center, or emergency room.  If you have a medical emergency, please immediately call 911 or go to the emergency department.  Pager Numbers  - Dr. Hester: [phone]  - Dr. Jackquline:  [phone]  - Dr. Claudene: [phone]   - Dr. Raymund: [phone]  In the event of inclement weather, please call our main line at [phone] for an update on the status of any delays or closures.  Dermatology Medication Tips: Please keep the boxes that topical medications come in in order to help keep track of the instructions about where and how to use these. Pharmacies typically print the medication instructions only on the boxes and not directly on the medication tubes.   If your medication is too expensive, please contact our office at [phone] option 4 or send us  a message through MyChart.   We are unable to tell what your co-pay for medications will be in advance as this is different depending on your insurance coverage. However, we may be able to find a substitute medication at lower cost or fill out  paperwork to get insurance to cover a needed medication.   If a prior authorization is required to get your medication covered by your insurance company, please allow us  1-2 business days to complete this process.  Drug prices often vary depending on where the prescription is filled and some pharmacies may offer cheaper prices.  The website www.goodrx.com contains coupons for medications through different pharmacies. The prices here do not account for what the cost may be with help from insurance (it may be cheaper with your insurance), but the website can give you the price if you did not use any insurance.  - You can print the associated coupon and take it with your prescription to the pharmacy.  - You may also stop by our office during regular business hours and pick up a GoodRx coupon card.  - If you need your prescription sent electronically to a different pharmacy, notify our office through [facility] or by phone at [phone] option 4.     Si Usted Necesita Algo Despus de Su Visita  Tambin puede enviarnos un mensaje a travs de Clinical cytogeneticist. Por lo general  respondemos a los mensajes de MyChart en el transcurso de 1 a 2 das hbiles.  Para renovar recetas, por favor pida a su farmacia que se ponga en contacto con nuestra oficina. Randi lakes de fax es [place] [phone].  Si tiene un asunto urgente cuando la clnica est cerrada y que no puede esperar hasta el siguiente da hbil, puede llamar/localizar a su doctor(a) al nmero que aparece a continuacin.   Por favor, tenga en cuenta que aunque hacemos todo lo posible para estar disponibles para asuntos urgentes fuera del horario de [place], no estamos disponibles las 24 horas del da, los 7 [address]  [address] de la [place].   Si tiene un problema urgente y no puede comunicarse con nosotros, puede optar por buscar atencin mdica  en el consultorio de su doctor(a), en una clnica privada, en un centro de atencin urgente o en una sala de emergencias.  Si tiene Engineer, drilling, por favor llame inmediatamente al 911 o vaya a la sala de emergencias.  Nmeros de bper  - Dr. Hester: [phone]  - Dra. Jackquline: [phone]  - Dr. Claudene: [phone]  - Dra. Kitts: [phone]  En caso de inclemencias del [place], por favor llame a nuestra lnea principal al [phone] para una actualizacin sobre el estado de cualquier retraso o cierre.  Consejos para la medicacin en dermatologa: Por favor, guarde las cajas en las que vienen los medicamentos de uso tpico para ayudarle a seguir las instrucciones sobre dnde y cmo usarlos. Las farmacias generalmente imprimen las instrucciones del medicamento slo en las cajas y no directamente en los tubos del [place].   Si su medicamento es muy caro, por favor, pngase en contacto con landry rieger llamando al [phone] y presione la opcin 4 o envenos un mensaje a travs de Clinical cytogeneticist.   No podemos decirle cul ser su copago por los medicamentos por adelantado ya que esto es diferente dependiendo de la cobertura de su seguro. Sin embargo, es posible  que podamos encontrar un medicamento sustituto a Audiological scientist un formulario para que el seguro cubra el medicamento que se considera necesario.   Si se requiere una autorizacin previa para que su compaa de seguros malta su medicamento, por favor permtanos de 1 a 2 das hbiles para completar este proceso.  Los precios de los medicamentos varan con frecuencia dependiendo del Environmental consultant de dnde se  surte la receta y alguna farmacias pueden ofrecer precios ms baratos.  El sitio web www.goodrx.com tiene cupones para medicamentos de Health and safety inspector. Los precios aqu no tienen en cuenta lo que podra costar con la ayuda del seguro (puede ser ms barato con su seguro), pero el sitio web puede darle el precio si no utiliz Tourist information centre manager.  - Puede imprimir el cupn correspondiente y llevarlo con su receta a la farmacia.  - Tambin puede pasar por nuestra oficina durante el horario de atencin regular y Education officer, museum una tarjeta de cupones de GoodRx.  - Si necesita que su receta se enve electrnicamente a una farmacia diferente, informe a nuestra oficina a travs de MyChart de [place] o por telfono llamando al [phone] y presione la opcin 4.

## 2024-01-16 NOTE — Progress Notes (Unsigned)
 Follow-Up Visit   Subjective  Connie Bradford is a 63 y.o. female who presents for the following: Skin Cancer Screening and Full Body Skin Exam Hx if bcc, hx of isks Reports some hair loss since march while taking Mounjaro  Also reports a spot at right cheek  The patient presents for Total-Body Skin Exam (TBSE) for skin cancer screening and mole check. The patient has spots, moles and lesions to be evaluated, some may be new or changing and the patient may have concern these could be cancer.  The following portions of the chart were reviewed this encounter and updated as appropriate: medications, allergies, medical history  Review of Systems:  No other skin or systemic complaints except as noted in HPI or Assessment and Plan.  Objective  Well appearing patient in no apparent distress; mood and affect are within normal limits.  A full examination was performed including scalp, head, eyes, ears, nose, lips, neck, chest, axillae, abdomen, back, buttocks, bilateral upper extremities, bilateral lower extremities, hands, feet, fingers, toes, fingernails, and toenails. All findings within normal limits unless otherwise noted below.   Relevant physical exam findings are noted in the Assessment and Plan.  Hair loss photos                Mole at right plantar foot         Assessment & Plan   TELOGEN EFFLUVIUM Exam: Diffuse thinning of hair, positive hair pull test. See photos Chronic and persistent condition with duration or expected duration over one year. Condition is symptomatic / bothersome to patient. Not to goal. Telogen effluvium is a benign, self-limited condition causing increased hair shedding usually for several months. It does not progress to baldness, and the hair eventually grows back on its own. It can be triggered by recent illness, recent surgery, thyroid  disease, low iron stores, vitamin D  deficiency, fad diets or rapid weight loss, hormonal changes such as  pregnancy or birth control pills, and some medication. Usually the hair loss starts 2-3 months after the illness or health change. Rarely, it can continue for longer than a year. Treatments options may include oral or topical Minoxidil; Red Light scalp treatments; Biotin 2.5 mg daily and other options. Treatment Plan: Discussed related to weight loss while on Mounjaro   Discussed  Doses of oral minoxidil for hair loss are considered 'low dose'. This is because the doses used for hair loss are much lower than the doses which are used for conditions such as high blood pressure (hypertension). The doses used for hypertension are 10-40mg  per day.  Side effects are uncommon at the low doses (up to 2.5 mg/day) used to treat hair loss. Potential side effects, more commonly seen at higher doses, include: Increase in hair growth (hypertrichosis) elsewhere on face and body Temporary hair shedding upon starting medication which may last up to 4 weeks Ankle swelling, fluid retention, rapid weight gain more than 5 pounds Low blood pressure and feeling lightheaded or dizzy when standing up quickly Fast or irregular heartbeat Headaches  Patient denies heart problems Patient denies problems with swelling in ankles   Start Minoxidil 2.5 mg tab take 1/2 tab daily by mouth daily  45 tabs 3 rfs   Will recheck in 6 months  History of breast cancer in 2021  Right breast.  - No lymphadenopathy   HISTORY OF BASAL CELL CARCINOMA OF THE SKIN 08/25/2014 right medial cheek infraorbital  - No evidence of recurrence today - Recommend regular full body skin exams -  Recommend daily broad spectrum sunscreen SPF 30+ to sun-exposed areas, reapply every 2 hours as needed.  - Call if any new or changing lesions are noted between office visits   HISTORY OF DYSPLASTIC NEVUS.  09/26/2018  Right mid anterior side/mild  No evidence of recurrence today Recommend regular full body skin exams Recommend daily broad spectrum  sunscreen SPF 30+ to sun-exposed areas, reapply every 2 hours as needed.  Call if any new or changing lesions are noted between office visits    SKIN CANCER SCREENING PERFORMED TODAY.  ACTINIC DAMAGE - Chronic condition, secondary to cumulative UV/sun exposure - diffuse scaly erythematous macules with underlying dyspigmentation - Recommend daily broad spectrum sunscreen SPF 30+ to sun-exposed areas, reapply every 2 hours as needed.  - Staying in the shade or wearing long sleeves, sun glasses (UVA+UVB protection) and wide brim hats (4-inch brim around the entire circumference of the hat) are also recommended for sun protection.  - Call for new or changing lesions.  LENTIGINES, SEBORRHEIC KERATOSES, HEMANGIOMAS - Benign normal skin lesions - Benign-appearing - Call for any changes  - sks at right cheek - discussed can treated with Ln2 patient declined treatment   MELANOCYTIC NEVI -  0.4 cm brown macule at right plantar foot see photos from 10/08/2020 and see new photos today  - Tan-brown and/or pink-flesh-colored symmetric macules and papules - Benign appearing on exam today - Observation - Call clinic for new or changing moles - Recommend daily use of broad spectrum spf 30+ sunscreen to sun-exposed areas.   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  Benign-appearing. Stable compared to previous visit. Observation.  Call clinic for new or changing moles.  Recommend daily use of broad spectrum spf 30+ sunscreen to sun-exposed areas.     TELOGEN EFFLUVIUM   Related Medications minoxidil (LONITEN) 2.5 MG tablet Take 1/2 tab by mouth daily for hair loss Return in about 6 months (around 07/15/2024) for hair loss follow up, 1 year tbse .  IEleanor Blush, CMA, am acting as scribe for Alm Rhyme, MD.   Documentation: I have reviewed the above documentation for accuracy and completeness, and I  agree with the above.  Alm Rhyme, MD

## 2024-01-17 ENCOUNTER — Encounter: Payer: Self-pay | Admitting: Internal Medicine

## 2024-01-17 ENCOUNTER — Encounter: Payer: Self-pay | Admitting: Dermatology

## 2024-02-06 ENCOUNTER — Other Ambulatory Visit: Payer: Self-pay

## 2024-02-06 MED ORDER — VALACYCLOVIR HCL 500 MG PO TABS: 500.0000 mg | ORAL_TABLET | Freq: Every day | ORAL | 11 refills | Status: AC

## 2024-02-06 NOTE — Telephone Encounter (Signed)
 Refill request for Valtrex  from [facility] pharmacy sent to [facility]

## 2024-02-09 ENCOUNTER — Encounter: Payer: Self-pay | Admitting: Internal Medicine

## 2024-02-16 ENCOUNTER — Ambulatory Visit: Admitting: Family Medicine

## 2024-02-16 ENCOUNTER — Encounter: Payer: Self-pay | Admitting: Family Medicine

## 2024-02-16 VITALS — BP 147/78 | HR 69 | Resp 16 | Wt 160.1 lb

## 2024-02-16 DIAGNOSIS — Z7985 Long-term (current) use of injectable non-insulin antidiabetic drugs: Secondary | ICD-10-CM | POA: Diagnosis not present

## 2024-02-16 DIAGNOSIS — E119 Type 2 diabetes mellitus without complications: Secondary | ICD-10-CM

## 2024-02-16 DIAGNOSIS — E785 Hyperlipidemia, unspecified: Secondary | ICD-10-CM

## 2024-02-16 NOTE — Progress Notes (Signed)
 Established patient visit   Patient: Connie Bradford   DOB: 04/21/1960   63 y.o. Female  MRN: [id number] Visit Date: 02/16/2024  Today's healthcare provider: Nancyann Perry, MD   Chief Complaint  Patient presents with   Medical Management of Chronic Issues    T2D and dyslipidemia.   Subjective    Discussed the use of AI scribe software for clinical note transcription with the patient, who gave verbal consent to proceed.  History of Present Illness   Connie Bradford is a 63 year old female with diabetes who presents for a routine checkup to monitor blood sugar and cholesterol levels.  She feels well and is currently taking Mounjaro , which is effective for her diabetes management. She has maintained her previous weight loss and her A1c levels have been stable.  She is also taking metformin  without any side effects. She is committed to a healthy diet, avoiding saturated fats, and engages in regular physical activity, including walking.  There is a family history of diabetes, as her brothers are diabetic. She is unsure about the prevalence of high cholesterol in her family.     Lab Results  Component Value Date   CHOL 300 (H) 10/16/2023   HDL 59 10/16/2023   LDLCALC 194 (H) 10/16/2023   TRIG 243 (H) 10/16/2023   CHOLHDL 5.1 (H) 10/16/2023   Lab Results  Component Value Date   NA 139 11/09/2023   K 4.1 11/09/2023   CREATININE 0.71 11/09/2023   GFRNONAA >60 11/09/2023   GLUCOSE 98 11/09/2023   Lab Results  Component Value Date   HGBA1C 5.5 10/16/2023   HGBA1C 8.1 (A) 07/03/2023   HGBA1C 6.7 (A) 03/03/2023   Wt Readings from Last 3 Encounters:  02/16/24 160 lb 1.6 oz (72.6 kg)  12/12/23 161 lb (73 kg)  11/09/23 161 lb 1.6 oz (73.1 kg)     Medications: Outpatient Medications Prior to Visit  Medication Sig   anastrozole  (ARIMIDEX ) 1 MG tablet Take 1 tablet (1 mg total) by mouth daily.   B COMPLEX VITAMINS PO Take 1 tablet by mouth daily.    Blood Glucose  Monitoring Suppl (ONE TOUCH ULTRA 2) w/Device KIT Use to check blood sugar daily for type 2 diabetes E11.9   Cholecalciferol (VITAMIN D ) 125 MCG (5000 UT) CAPS Take 5,000 Units by mouth daily.    clobetasol  cream (TEMOVATE ) 0.05 % Apply 1 application. topically 2 (two) times daily.   COLLAGEN PO Take 1 tablet by mouth daily. Super Collagen w/Biotin   Crisaborole  (EUCRISA ) 2 % OINT Apply twice daily to affected areas at lips.   glucose blood (ONETOUCH ULTRA) test strip Use to check blood sugar daily for type 2 diabetes E11.9   ibuprofen (ADVIL) 200 MG tablet Take 600 mg by mouth every 8 (eight) hours as needed (for pain.).    ketoconazole  (NIZORAL ) 2 % cream Apply 1 gram to corners of mouth twice a day   metFORMIN  (GLUCOPHAGE -XR) 500 MG 24 hr tablet Take 1 tablet (500 mg total) by mouth daily with breakfast.   minoxidil (LONITEN) 2.5 MG tablet Take 1/2 tab by mouth daily for hair loss   Multiple Vitamin (MULTIVITAMIN WITH MINERALS) TABS tablet Take 1 tablet by mouth daily.   Omega 3-6-9 Fatty Acids (OMEGA 3-6-9 COMPLEX) CAPS Take 1 capsule by mouth daily.   ondansetron  (ZOFRAN ) 8 MG tablet One pill every 8 hours as needed for nausea/vomitting.   prochlorperazine  (COMPAZINE ) 10 MG tablet Take 1 tablet (10  mg total) by mouth every 6 (six) hours as needed for nausea or vomiting.   tirzepatide  (MOUNJARO ) 5 MG/0.5ML Pen Inject 5 mg into the skin once a week.   valACYclovir  (VALTREX ) 500 MG tablet Take 1 tablet (500 mg total) by mouth daily.   No facility-administered medications prior to visit.   Review of Systems  Constitutional:  Negative for appetite change, chills, fatigue and fever.  Respiratory:  Negative for chest tightness and shortness of breath.   Cardiovascular:  Negative for chest pain and palpitations.  Gastrointestinal:  Negative for abdominal pain, nausea and vomiting.  Neurological:  Negative for dizziness and weakness.       Objective    BP (!) 147/78 (BP Location: Left Arm,  Patient Position: Sitting, Cuff Size: Normal)   Pulse 69   Resp 16   Wt 160 lb 1.6 oz (72.6 kg)   LMP 12/18/2013   SpO2 99%   BMI 25.84 kg/m   Physical Exam   General appearance: Well developed, well nourished female, cooperative and in no acute distress Head: Normocephalic, without obvious abnormality, atraumatic Respiratory: Respirations even and unlabored, normal respiratory rate Extremities: All extremities are intact.  Skin: Skin color, texture, turgor normal. No rashes seen  Psych: Appropriate mood and affect. Neurologic: Mental status: Alert, oriented to person, place, and time, thought content appropriate.    Assessment & Plan    1. Type 2 diabetes mellitus without complication, without long-term current use of insulin (HCC) (Primary) Doing very well on current dose of Mounjaro .  - Hemoglobin A1c  Consider discontinuation of metformin  if A1c remains normal.   2. Dyslipidemia Following low saturated diet and exercising regularly.  - Lipid panel - Hepatic function panel  Agreeable to starting statin if lipids remain elevated.       Nancyann Perry, MD  [facility] [phone] (phone) [phone] (fax)  [facility]

## 2024-02-17 LAB — HEMOGLOBIN A1C
Est. average glucose Bld gHb Est-mCnc: 108 mg/dL
Hgb A1c MFr Bld: 5.4 % (ref 4.8–5.6)

## 2024-02-17 LAB — HEPATIC FUNCTION PANEL
ALT: 15 IU/L (ref 0–32)
AST: 18 IU/L (ref 0–40)
Albumin: 4.7 g/dL (ref 3.9–4.9)
Alkaline Phosphatase: 80 IU/L (ref 49–135)
Bilirubin Total: 0.6 mg/dL (ref 0.0–1.2)
Bilirubin, Direct: 0.15 mg/dL (ref 0.00–0.40)
Total Protein: 6.6 g/dL (ref 6.0–8.5)

## 2024-02-17 LAB — LIPID PANEL
Chol/HDL Ratio: 4.2 ratio (ref 0.0–4.4)
Cholesterol, Total: 269 mg/dL — ABNORMAL HIGH (ref 100–199)
HDL: 64 mg/dL (ref >39)
LDL Chol Calc (NIH): 170 mg/dL — ABNORMAL HIGH (ref 0–99)
Triglycerides: 192 mg/dL — ABNORMAL HIGH (ref 0–149)
VLDL Cholesterol Cal: 35 mg/dL (ref 5–40)

## 2024-02-18 ENCOUNTER — Ambulatory Visit: Payer: Self-pay | Admitting: Family Medicine

## 2024-02-18 DIAGNOSIS — E785 Hyperlipidemia, unspecified: Secondary | ICD-10-CM

## 2024-02-18 MED ORDER — ROSUVASTATIN CALCIUM 5 MG PO TABS: 5.0000 mg | ORAL_TABLET | Freq: Every day | ORAL | 2 refills | Status: DC

## 2024-03-30 ENCOUNTER — Other Ambulatory Visit: Payer: Self-pay | Admitting: Family Medicine

## 2024-03-30 DIAGNOSIS — E119 Type 2 diabetes mellitus without complications: Secondary | ICD-10-CM

## 2024-04-15 ENCOUNTER — Other Ambulatory Visit: Payer: Self-pay | Admitting: General Surgery

## 2024-04-15 ENCOUNTER — Other Ambulatory Visit: Payer: Self-pay

## 2024-04-15 DIAGNOSIS — Z1231 Encounter for screening mammogram for malignant neoplasm of breast: Secondary | ICD-10-CM

## 2024-04-15 DIAGNOSIS — L650 Telogen effluvium: Secondary | ICD-10-CM

## 2024-04-15 MED ORDER — MINOXIDIL 2.5 MG PO TABS: ORAL_TABLET | ORAL | 3 refills | Status: AC

## 2024-04-30 ENCOUNTER — Other Ambulatory Visit: Payer: Self-pay | Admitting: *Deleted

## 2024-04-30 MED ORDER — ANASTROZOLE 1 MG PO TABS: 1.0000 mg | ORAL_TABLET | Freq: Every day | ORAL | 2 refills | Status: AC

## 2024-05-06 ENCOUNTER — Telehealth: Payer: Self-pay | Admitting: Family Medicine

## 2024-05-06 ENCOUNTER — Other Ambulatory Visit: Payer: Self-pay

## 2024-05-06 DIAGNOSIS — E785 Hyperlipidemia, unspecified: Secondary | ICD-10-CM

## 2024-05-06 MED ORDER — ROSUVASTATIN CALCIUM 5 MG PO TABS: 5.0000 mg | ORAL_TABLET | Freq: Every day | ORAL | 2 refills | Status: AC

## 2024-05-06 NOTE — Telephone Encounter (Signed)
[facility] faxed refill request for the following medications:  rosuvastatin (CRESTOR) 5 MG tablet   Please advise.

## 2024-06-04 ENCOUNTER — Encounter

## 2024-06-11 ENCOUNTER — Other Ambulatory Visit

## 2024-06-11 ENCOUNTER — Ambulatory Visit: Admitting: Internal Medicine

## 2024-06-19 ENCOUNTER — Ambulatory Visit: Admitting: Family Medicine

## 2024-07-18 ENCOUNTER — Ambulatory Visit: Admitting: Dermatology

## 2024-07-30 ENCOUNTER — Ambulatory Visit: Admitting: Dermatology

## 2024-08-07 ENCOUNTER — Ambulatory Visit: Admitting: Dermatology

## 2025-01-16 ENCOUNTER — Ambulatory Visit: Admitting: Dermatology
